# Patient Record
Sex: Female | Born: 1965 | Race: Black or African American | Hispanic: No | Marital: Married | Smoking: Never smoker
Health system: Southern US, Community
[De-identification: ages and names within clinical notes are randomized; demographics above are authoritative.]

## PROBLEM LIST (undated history)

## (undated) DIAGNOSIS — E079 Disorder of thyroid, unspecified: Secondary | ICD-10-CM

## (undated) DIAGNOSIS — I4510 Unspecified right bundle-branch block: Secondary | ICD-10-CM

## (undated) DIAGNOSIS — I10 Essential (primary) hypertension: Secondary | ICD-10-CM

## (undated) DIAGNOSIS — Z951 Presence of aortocoronary bypass graft: Secondary | ICD-10-CM

## (undated) DIAGNOSIS — R011 Cardiac murmur, unspecified: Secondary | ICD-10-CM

## (undated) DIAGNOSIS — K219 Gastro-esophageal reflux disease without esophagitis: Secondary | ICD-10-CM

## (undated) DIAGNOSIS — Z8774 Personal history of (corrected) congenital malformations of heart and circulatory system: Secondary | ICD-10-CM

## (undated) DIAGNOSIS — E039 Hypothyroidism, unspecified: Secondary | ICD-10-CM

## (undated) DIAGNOSIS — R7303 Prediabetes: Secondary | ICD-10-CM

## (undated) DIAGNOSIS — M1990 Unspecified osteoarthritis, unspecified site: Secondary | ICD-10-CM

## (undated) DIAGNOSIS — E119 Type 2 diabetes mellitus without complications: Secondary | ICD-10-CM

## (undated) HISTORY — DX: Unspecified right bundle-branch block: I45.10

## (undated) HISTORY — DX: Personal history of (corrected) congenital malformations of heart and circulatory system: Z87.74

## (undated) HISTORY — PX: CORONARY ARTERY BYPASS GRAFT: SHX141

## (undated) HISTORY — PX: COLONOSCOPY: SHX174

## (undated) HISTORY — PX: DILATION AND CURETTAGE OF UTERUS: SHX78

---
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
## 2001-02-09 ENCOUNTER — Ambulatory Visit (HOSPITAL_COMMUNITY): Admission: RE | Admit: 2001-02-09 | Discharge: 2001-02-09 | Payer: Self-pay | Admitting: Obstetrics

## 2001-02-09 ENCOUNTER — Encounter: Payer: Self-pay | Admitting: Obstetrics

## 2001-05-10 ENCOUNTER — Ambulatory Visit (HOSPITAL_COMMUNITY): Admission: RE | Admit: 2001-05-10 | Discharge: 2001-05-10 | Payer: Self-pay | Admitting: Obstetrics

## 2004-01-30 ENCOUNTER — Ambulatory Visit: Payer: Self-pay | Admitting: Family Medicine

## 2004-02-25 ENCOUNTER — Ambulatory Visit: Payer: Self-pay | Admitting: Internal Medicine

## 2004-02-26 ENCOUNTER — Ambulatory Visit: Payer: Self-pay | Admitting: *Deleted

## 2004-03-20 ENCOUNTER — Ambulatory Visit: Payer: Self-pay | Admitting: Family Medicine

## 2004-06-16 ENCOUNTER — Ambulatory Visit: Payer: Self-pay | Admitting: Internal Medicine

## 2004-06-25 ENCOUNTER — Ambulatory Visit (HOSPITAL_COMMUNITY): Admission: RE | Admit: 2004-06-25 | Discharge: 2004-06-25 | Payer: Self-pay | Admitting: Internal Medicine

## 2004-06-25 ENCOUNTER — Encounter (INDEPENDENT_AMBULATORY_CARE_PROVIDER_SITE_OTHER): Payer: Self-pay | Admitting: *Deleted

## 2004-07-17 ENCOUNTER — Ambulatory Visit: Payer: Self-pay | Admitting: Internal Medicine

## 2006-05-11 ENCOUNTER — Ambulatory Visit (HOSPITAL_COMMUNITY): Admission: RE | Admit: 2006-05-11 | Discharge: 2006-05-11 | Payer: Self-pay | Admitting: Obstetrics and Gynecology

## 2006-06-01 ENCOUNTER — Encounter: Admission: RE | Admit: 2006-06-01 | Discharge: 2006-06-01 | Payer: Self-pay | Admitting: Obstetrics and Gynecology

## 2006-11-24 ENCOUNTER — Encounter: Admission: RE | Admit: 2006-11-24 | Discharge: 2006-11-24 | Payer: Self-pay | Admitting: Obstetrics and Gynecology

## 2007-05-13 ENCOUNTER — Encounter: Admission: RE | Admit: 2007-05-13 | Discharge: 2007-05-13 | Payer: Self-pay | Admitting: Obstetrics and Gynecology

## 2008-09-28 ENCOUNTER — Ambulatory Visit (HOSPITAL_COMMUNITY): Admission: RE | Admit: 2008-09-28 | Discharge: 2008-09-28 | Payer: Self-pay | Admitting: Obstetrics and Gynecology

## 2008-09-28 ENCOUNTER — Encounter (HOSPITAL_COMMUNITY): Payer: Self-pay | Admitting: Obstetrics and Gynecology

## 2010-01-13 ENCOUNTER — Encounter: Admission: RE | Admit: 2010-01-13 | Discharge: 2010-01-13 | Payer: Self-pay | Admitting: Obstetrics and Gynecology

## 2010-05-31 LAB — COMPREHENSIVE METABOLIC PANEL
ALT: 14 U/L (ref 0–35)
ALT: 15 U/L (ref 0–35)
AST: 22 U/L (ref 0–37)
AST: 24 U/L (ref 0–37)
Albumin: 3.4 g/dL — ABNORMAL LOW (ref 3.5–5.2)
Albumin: 3.6 g/dL (ref 3.5–5.2)
Alkaline Phosphatase: 50 U/L (ref 39–117)
Alkaline Phosphatase: 50 U/L (ref 39–117)
BUN: 14 mg/dL (ref 6–23)
BUN: 9 mg/dL (ref 6–23)
CO2: 24 mEq/L (ref 19–32)
CO2: 27 mEq/L (ref 19–32)
Calcium: 8.3 mg/dL — ABNORMAL LOW (ref 8.4–10.5)
Calcium: 8.9 mg/dL (ref 8.4–10.5)
Chloride: 105 mEq/L (ref 96–112)
Chloride: 107 mEq/L (ref 96–112)
Creatinine, Ser: 0.64 mg/dL (ref 0.4–1.2)
Creatinine, Ser: 0.68 mg/dL (ref 0.4–1.2)
GFR calc Af Amer: >60 mL/min (ref >60)
GFR calc Af Amer: >60 mL/min (ref >60)
GFR calc non Af Amer: >60 mL/min (ref >60)
GFR calc non Af Amer: >60 mL/min (ref >60)
Glucose, Bld: 88 mg/dL (ref 70–99)
Glucose, Bld: 90 mg/dL (ref 70–99)
Potassium: 3.3 mEq/L — ABNORMAL LOW (ref 3.5–5.1)
Potassium: 3.6 mEq/L (ref 3.5–5.1)
Sodium: 136 mEq/L (ref 135–145)
Sodium: 136 mEq/L (ref 135–145)
Total Bilirubin: 0.6 mg/dL (ref 0.3–1.2)
Total Bilirubin: 0.8 mg/dL (ref 0.3–1.2)
Total Protein: 6.6 g/dL (ref 6.0–8.3)
Total Protein: 6.8 g/dL (ref 6.0–8.3)

## 2010-05-31 LAB — TYPE AND SCREEN
ABO/RH(D): O POS
Antibody Screen: NEGATIVE

## 2010-05-31 LAB — CBC
HCT: 35.4 % — ABNORMAL LOW (ref 36.0–46.0)
Hemoglobin: 12.1 g/dL (ref 12.0–15.0)
MCHC: 34.2 g/dL (ref 30.0–36.0)
MCV: 93.5 fL (ref 78.0–100.0)
Platelets: 245 10*3/uL (ref 150–400)
RBC: 3.78 MIL/uL — ABNORMAL LOW (ref 3.87–5.11)
RDW: 12.7 % (ref 11.5–15.5)
WBC: 7.5 10*3/uL (ref 4.0–10.5)

## 2010-05-31 LAB — ABO/RH: ABO/RH(D): O POS

## 2010-07-11 NOTE — Op Note (Signed)
[name], [name]             ACCOUNT NO.:  [id number]   MEDICAL RECORD NO.:  [id number]          PATIENT TYPE:  AMB   LOCATION:  SDC                           FACILITY:  [facility]   PHYSICIAN:  Zelphia Cairo, MD    DATE OF BIRTH:  15-Jul-1965   DATE OF PROCEDURE:  10/08/2008  DATE OF DISCHARGE:  09/28/2008                               OPERATIVE REPORT   PREOPERATIVE DIAGNOSIS:  Submucous fibroid.   PROCEDURE:  Diagnostic hysteroscopy with resection of submucous fibroid.   SURGEON:  Zelphia Cairo, MD.   ANESTHESIA:  General.   FINDINGS:  A 2-cm pedunculated submucous fibroid.   SPECIMEN:  Endometrial curettings and fibroid.   DISPOSITION:  To pathology.   ESTIMATED BLOOD LOSS:  Minimal.   COMPLICATIONS:  None.   FLUID DEFICIT:  350 mL.   CONDITION:  Stable and extubated to recovery room.   PROCEDURE:  Stephanie Garrison was taken to the operating room where general  anesthesia was found to be adequate.  She was placed in the dorsal  lithotomy position using Allen stirrups.  She was prepped and draped in  sterile fashion and an in-and-out catheter was used to drain her bladder  sterilely.  A bivalve speculum was placed in the vagina and a single-  tooth tenaculum placed on the anterior lip of the cervix.  The cervix  was serially dilated with Shawnie Pons dilators.  The diagnostic hysteroscope  was inserted into the uterus and a survey of the uterine cavity was  performed.  A large pedunculated submucous fibroid was noted within the  cavity.  We could sneak the camera behind the fibroid to see the base of  the fibroid.  The resectoscope was then inserted.  Cautery loop was used  to resect the fibroid at the base.  The loop would be placed behind the  fibroid and then shaved towards the camara.  It was difficult to monitor  our fluid deficit because of the large pooling of fluid in the drape and  on the floor.  Once more than half of the fibroid had been shaved near  the base.  The  curette was inserted into the uterine cavity and the  fibroid was gently curetted free from the uterine wall.  The  hysteroscope was reinserted.  No bleeding was noted.  No other  endometrial abnormalities were noted at this time.  The specimen was  placed on Telfa and passed off to be sent to pathology.  Hysteroscope  was then removed.  Tenaculum was removed and the cervix was found to be  hemostatic.  Speculum  was removed and the patient was taken to the recovery room.  Because of  difficulty monitoring fluid deficit, a sodium level was drawn in the  PACU.  This was normal and the patient was discharged home to follow up  in our office in 2 weeks.      Zelphia Cairo, MD  Electronically Signed     GA/MEDQ  D:  10/12/2008  T:  10/13/2008  Job:  [id number]

## 2010-07-11 NOTE — Op Note (Signed)
[facility]  Patient:    Stephanie Garrison, Stephanie Garrison Visit Number: [id number] MRN: [id number]          Service Type: DSU Location: [facility] Attending Physician:  Venita Sheffield Dictated by:   Kathreen Cosier, M.D. Proc. Date: 05/10/01 Admit Date:  05/10/2001                             Operative Report  PREOPERATIVE DIAGNOSIS:       Multiparity.  POSTOPERATIVE DIAGNOSIS:  PROCEDURE:                    Open laparoscopic tubal sterilization.  SURGEON:                      Kathreen Cosier, M.D.  ANESTHESIA:                   General anesthesia.  DESCRIPTION OF PROCEDURE:     With patient in lithotomy position, abdomen, perineum and vagina prepped and draped, bladder emptied with a straight catheter.  Weighted speculum was placed in the vagina, cervix grasped with a Hulka tenaculum.  In the umbilicus, a transverse incision was made and carried down to the fascia, fascia cleaned, grasped with two trocars, fascia and the peritoneum opened with the Mayo scissors, sleeve of the trocar inserted intraperitoneally, 3 L of CO2 infused intraperitoneally.  Uterus, tubes and ovaries were normal.  Cautery probe inserted through the sleeve of the scope and the tubes was traced to the fimbriae bilaterally.  The right tube was grasped one inch from the cornu and cauterized; the tube was cauterized in a total of five placed, moving lateral from the first site of cautery; procedure was done in a similar fashion on the other side.  Probes were removed and CO2 allowed to escape from the peritoneal cavity.  Fascia was closed with one stitch of 0 Dexon and the skin closed with subcuticular stitch of 3-0 plain. Patient tolerated the procedure well and taken to recovery room in good condition. Dictated by:   Kathreen Cosier, M.D. Attending Physician:  Venita Sheffield DD:  05/10/01 TD:  05/10/01 Job: [id number]

## 2010-12-11 ENCOUNTER — Other Ambulatory Visit: Payer: Self-pay | Admitting: Obstetrics and Gynecology

## 2010-12-11 DIAGNOSIS — Z1231 Encounter for screening mammogram for malignant neoplasm of breast: Secondary | ICD-10-CM

## 2011-01-19 ENCOUNTER — Ambulatory Visit: Payer: Self-pay

## 2011-02-18 ENCOUNTER — Ambulatory Visit: Payer: Self-pay

## 2011-02-19 ENCOUNTER — Ambulatory Visit
Admission: RE | Admit: 2011-02-19 | Discharge: 2011-02-19 | Disposition: A | Discharge status: Home / Self Care | Payer: Commercial Indemnity | Source: Ambulatory Visit | Attending: Obstetrics and Gynecology | Admitting: Obstetrics and Gynecology

## 2011-02-19 DIAGNOSIS — Z1231 Encounter for screening mammogram for malignant neoplasm of breast: Secondary | ICD-10-CM

## 2011-08-26 ENCOUNTER — Encounter (HOSPITAL_COMMUNITY): Payer: Self-pay | Admitting: *Deleted

## 2011-08-26 ENCOUNTER — Emergency Department (HOSPITAL_COMMUNITY)
Admission: EM | Admit: 2011-08-26 | Discharge: 2011-08-26 | Disposition: A | Discharge status: Home / Self Care | Payer: BC Managed Care – PPO | Attending: Emergency Medicine | Admitting: Emergency Medicine

## 2011-08-26 DIAGNOSIS — Z951 Presence of aortocoronary bypass graft: Secondary | ICD-10-CM | POA: Insufficient documentation

## 2011-08-26 DIAGNOSIS — R55 Syncope and collapse: Secondary | ICD-10-CM

## 2011-08-26 DIAGNOSIS — F410 Panic disorder [episodic paroxysmal anxiety] without agoraphobia: Secondary | ICD-10-CM | POA: Insufficient documentation

## 2011-08-26 DIAGNOSIS — Z7289 Other problems related to lifestyle: Secondary | ICD-10-CM

## 2011-08-26 DIAGNOSIS — I10 Essential (primary) hypertension: Secondary | ICD-10-CM | POA: Insufficient documentation

## 2011-08-26 HISTORY — DX: Presence of aortocoronary bypass graft: Z95.1

## 2011-08-26 HISTORY — DX: Essential (primary) hypertension: I10

## 2011-08-26 LAB — POCT I-STAT, CHEM 8
BUN: 13 mg/dL (ref 6–23)
Calcium, Ion: 1.12 mmol/L (ref 1.12–1.32)
Chloride: 106 mEq/L (ref 96–112)
Creatinine, Ser: 0.9 mg/dL (ref 0.50–1.10)
Glucose, Bld: 102 mg/dL — ABNORMAL HIGH (ref 70–99)
HCT: 37 % (ref 36.0–46.0)
Hemoglobin: 12.6 g/dL (ref 12.0–15.0)
Potassium: 3 mEq/L — ABNORMAL LOW (ref 3.5–5.1)
Sodium: 144 mEq/L (ref 135–145)
TCO2: 21 mmol/L (ref 0–100)

## 2011-08-26 MED ORDER — POTASSIUM CHLORIDE CRYS ER 20 MEQ PO TBCR
40.0000 meq | EXTENDED_RELEASE_TABLET | Freq: Once | ORAL | Status: AC
  Administered 2011-08-26: 40 meq via ORAL
  Filled 2011-08-26: qty 2

## 2011-08-26 NOTE — ED Notes (Signed)
[id number]<AV> Expected date:08/26/11<BR> Expected time:12:35 AM<BR> Means of arrival:Ambulance<BR> Comments:<BR> Syncope/ETOH

## 2011-08-26 NOTE — ED Notes (Signed)
Pt here via EMS, with c/o anxiety attack and syncopal episode. Pt admits to drinking 3 drinks tonight. Pt alert on arrival, talking, walking without assistance.

## 2011-08-26 NOTE — ED Notes (Signed)
Awaiting Md to assess. Pt stable. Family at bedside.

## 2011-08-26 NOTE — ED Provider Notes (Signed)
History     CSN: [id number]  Arrival date & time 08/26/11  0051   First MD Initiated Contact with Patient 08/26/11 0125      Chief Complaint  Patient presents with  . Panic Attack  . Loss of Consciousness    (Consider location/radiation/quality/duration/timing/severity/associated sxs/prior treatment) Patient is a 46 y.o. female presenting with syncope. The history is provided by the patient.  Loss of Consciousness   patient here with anxiety attack and near syncopal event. She has consumed 72 ounces of margarita beverage this evening. Notes similar symptoms in the past with an denies any chest pain or chest pressure. No shortness of breath or leg pain or swelling. No change in her bowel or bladder habits. No black or bloody stools. No abdominal pain. Denies illicit drug use this evening. No seizure activity noted. She feels back to her baseline Past Medical History  Diagnosis Date  . S/P CABG (coronary artery bypass graft) 1964    when she was 46 years old  . Hypertension     Past Surgical History  Procedure Date  . Cesarean section   . Dilation and curettage of uterus     Family History  Problem Relation Age of Onset  . Hyperlipidemia Mother     History  Substance Use Topics  . Smoking status: Never Smoker   . Smokeless tobacco: Not on file  . Alcohol Use: Yes     3 drinks tonight    OB History    Grav Para Term Preterm Abortions TAB SAB Ect Mult Living                  Review of Systems  Cardiovascular: Positive for syncope.  All other systems reviewed and are negative.    Allergies  Review of patient's allergies indicates no known allergies.  Home Medications  No current outpatient prescriptions on file.  BP 110/70  Pulse 78  Temp 98.5 F (36.9 C) (Oral)  Resp 18  SpO2 100%  Physical Exam  Nursing note and vitals reviewed. Constitutional: She is oriented to person, place, and time. She appears well-developed and well-nourished.  Non-toxic  appearance. No distress.  HENT:  Head: Normocephalic and atraumatic.  Eyes: Conjunctivae, EOM and lids are normal. Pupils are equal, round, and reactive to light.  Neck: Normal range of motion. Neck supple. No tracheal deviation present. No mass present.  Cardiovascular: Normal rate, regular rhythm and normal heart sounds.  Exam reveals no gallop.   No murmur heard. Pulmonary/Chest: Effort normal and breath sounds normal. No stridor. No respiratory distress. She has no decreased breath sounds. She has no wheezes. She has no rhonchi. She has no rales.  Abdominal: Soft. Normal appearance and bowel sounds are normal. She exhibits no distension. There is no tenderness. There is no rebound and no CVA tenderness.  Musculoskeletal: Normal range of motion. She exhibits no edema and no tenderness.  Neurological: She is alert and oriented to person, place, and time. She has normal strength. No cranial nerve deficit or sensory deficit. GCS eye subscore is 4. GCS verbal subscore is 5. GCS motor subscore is 6.  Skin: Skin is warm and dry. No abrasion and no rash noted.  Psychiatric: Her speech is normal and behavior is normal. Her mood appears anxious.    ED Course  Procedures (including critical care time)  Labs Reviewed - No data to display No results found.   No diagnosis found.    MDM  Patient's hypokalemia treated with oral  potassium. Patient is not orthostatic. EKG shows no signs of ACS or severe prolonged QT. Suspect that patient had her near syncopal event secondary to alcohol use. She is stable for discharge   Date: 08/26/2011  Rate: 74  Rhythm: normal sinus rhythm  QRS Axis: normal  Intervals: normal  ST/T Wave abnormalities: nonspecific ST/T changes  Conduction Disutrbances:right bundle branch block  Narrative Interpretation:   Old EKG Reviewed: none available          Toy Baker, MD 08/26/11 [phone]

## 2012-01-07 ENCOUNTER — Other Ambulatory Visit: Payer: Self-pay | Admitting: Obstetrics and Gynecology

## 2012-01-07 DIAGNOSIS — Z1231 Encounter for screening mammogram for malignant neoplasm of breast: Secondary | ICD-10-CM

## 2012-02-22 ENCOUNTER — Ambulatory Visit
Admission: RE | Admit: 2012-02-22 | Discharge: 2012-02-22 | Disposition: A | Discharge status: Home / Self Care | Payer: BC Managed Care – PPO | Source: Ambulatory Visit | Attending: Obstetrics and Gynecology | Admitting: Obstetrics and Gynecology

## 2012-02-22 DIAGNOSIS — Z1231 Encounter for screening mammogram for malignant neoplasm of breast: Secondary | ICD-10-CM

## 2012-07-08 ENCOUNTER — Other Ambulatory Visit: Payer: Self-pay | Admitting: Pharmacist Clinician (PhC)/ Clinical Pharmacy Specialist

## 2012-07-08 MED ORDER — OLMESARTAN MEDOXOMIL-HCTZ 40-25 MG PO TABS: 1.0000 | ORAL_TABLET | Freq: Every day | ORAL | Status: DC

## 2012-09-29 ENCOUNTER — Ambulatory Visit: Payer: BC Managed Care – PPO | Admitting: Cardiovascular Disease

## 2012-09-29 ENCOUNTER — Encounter: Payer: Self-pay | Admitting: Cardiovascular Disease

## 2012-09-29 ENCOUNTER — Ambulatory Visit (INDEPENDENT_AMBULATORY_CARE_PROVIDER_SITE_OTHER): Payer: BC Managed Care – PPO | Admitting: Cardiovascular Disease

## 2012-09-29 VITALS — BP 118/78 | HR 71 | Ht 62.0 in | Wt 171.6 lb

## 2012-09-29 DIAGNOSIS — I10 Essential (primary) hypertension: Secondary | ICD-10-CM | POA: Insufficient documentation

## 2012-09-29 DIAGNOSIS — I4510 Unspecified right bundle-branch block: Secondary | ICD-10-CM

## 2012-09-29 DIAGNOSIS — Q249 Congenital malformation of heart, unspecified: Secondary | ICD-10-CM | POA: Insufficient documentation

## 2012-09-29 NOTE — Patient Instructions (Addendum)
Your physician wants you to follow-up in: 1 year with Dr Berry. You will receive a reminder letter in the mail two months in advance. If you don't receive a letter, please call our office to schedule the follow-up appointment.  

## 2012-09-29 NOTE — Assessment & Plan Note (Signed)
This was done back in the 70s. She's been completely asymptomatic since. Her last 2-D echo performed 10/10/09 revealed normal LV size and function. There was no ventricular septal defect. There is no pulmonic defect as well.

## 2012-09-29 NOTE — Assessment & Plan Note (Signed)
Under adequate control on current medications 

## 2012-09-29 NOTE — Progress Notes (Signed)
   09/29/2012 Stephanie Garrison   [birth year]  [id number]  Primary Physician Gwynneth Aliment, MD Primary Cardiologist: Runell Gess MD Stephanie Garrison   HPI:  The patient is a very pleasant 47 year old mildly overweight married [place] American female, mother of 1 child, who I last saw a year ago. 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 Her other problems include hypertension, recurrent right bundle branch block. She has been asymptomatic since I saw her last except for 1 episode of what sounds like PAF as a result of excessive alcohol intake. Her last echocardiogram performed, October 10, 2009, showed normal LV size and function with normal valvular function, and borderline dilated right ventricle with normal RV systolic function. Since I saw her a year ago she  has remained entirely asymptomatic.      Current Outpatient Prescriptions  Medication Sig Dispense Refill  . amLODipine (NORVASC) 5 MG tablet Take 5 mg by mouth daily.      . cholecalciferol (VITAMIN D) 1000 UNITS tablet Take 1,000 Units by mouth daily. Take one on Tuesday and on Friday      . olmesartan-hydrochlorothiazide (BENICAR HCT) 40-25 MG per tablet Take 1 tablet by mouth daily.  30 tablet  5   No current facility-administered medications for this visit.    No Known Allergies  History   Social History  . Marital Status: Married    Spouse Name: N/A    Number of Children: N/A  . Years of Education: N/A   Occupational History  . Not on file.   Social History Main Topics  . Smoking status: Never Smoker   . Smokeless tobacco: Not on file  . Alcohol Use: Yes     Comment: 3 drinks tonight  . Drug Use: No  . Sexually Active: Yes   Other Topics Concern  . Not on file   Social History Narrative  . No narrative on file     Review of Systems: General: negative for chills, fever, night sweats or weight changes.  Cardiovascular: negative for chest pain, dyspnea on exertion,  edema, orthopnea, palpitations, paroxysmal nocturnal dyspnea or shortness of breath Dermatological: negative for rash Respiratory: negative for cough or wheezing Urologic: negative for hematuria Abdominal: negative for nausea, vomiting, diarrhea, bright red blood per rectum, melena, or hematemesis Neurologic: negative for visual changes, syncope, or dizziness All other systems reviewed and are otherwise negative except as noted above.    Blood pressure 118/78, pulse 71, height 5\' 2"  (1.575 m), weight 171 lb 9.6 oz (77.837 kg), last menstrual period 09/23/2012.  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 She's been completely asymptomatic since. Her last 2-D echo performed 10/10/09 revealed normal LV size and function. There was no ventricular septal defect. There is no pulmonic defect as well.  Essential hypertension Under adequate control on current medications      Runell Gess MD [facility], [facility] 09/29/2012 4:00 PM

## 2012-10-17 ENCOUNTER — Other Ambulatory Visit: Payer: Self-pay | Admitting: Cardiovascular Disease

## 2013-02-21 ENCOUNTER — Other Ambulatory Visit: Payer: Self-pay

## 2013-02-21 DIAGNOSIS — Z1231 Encounter for screening mammogram for malignant neoplasm of breast: Secondary | ICD-10-CM

## 2013-03-02 ENCOUNTER — Ambulatory Visit
Admission: RE | Admit: 2013-03-02 | Discharge: 2013-03-02 | Disposition: A | Discharge status: Home / Self Care | Payer: 59 | Source: Ambulatory Visit

## 2013-03-02 DIAGNOSIS — Z1231 Encounter for screening mammogram for malignant neoplasm of breast: Secondary | ICD-10-CM

## 2013-05-01 ENCOUNTER — Other Ambulatory Visit: Payer: Self-pay | Admitting: Pharmacist Clinician (PhC)/ Clinical Pharmacy Specialist

## 2013-05-01 MED ORDER — OLMESARTAN MEDOXOMIL-HCTZ 40-25 MG PO TABS: 1.0000 | ORAL_TABLET | Freq: Every day | ORAL | Status: DC

## 2013-05-03 ENCOUNTER — Other Ambulatory Visit: Payer: Self-pay | Admitting: Pharmacist Clinician (PhC)/ Clinical Pharmacy Specialist

## 2013-08-16 ENCOUNTER — Encounter (HOSPITAL_COMMUNITY): Payer: Self-pay | Admitting: Pharmacist

## 2013-10-02 ENCOUNTER — Ambulatory Visit (INDEPENDENT_AMBULATORY_CARE_PROVIDER_SITE_OTHER): Payer: 59 | Admitting: Cardiovascular Disease

## 2013-10-02 ENCOUNTER — Encounter: Payer: Self-pay | Admitting: Cardiovascular Disease

## 2013-10-02 VITALS — BP 135/94 | HR 71 | Ht 62.0 in | Wt 176.0 lb

## 2013-10-02 DIAGNOSIS — I4510 Unspecified right bundle-branch block: Secondary | ICD-10-CM

## 2013-10-02 DIAGNOSIS — I10 Essential (primary) hypertension: Secondary | ICD-10-CM

## 2013-10-02 NOTE — Progress Notes (Signed)
     10/02/2013 Stephanie Garrison   [birth year]  [id number]  Primary Physician Maximino Greenland, MD Primary Cardiologist:  Lorretta Harp MD [name]   HPI:  The patient is a very pleasant 48 year old mildly overweight married [place] American female, mother of 1 child, who I last saw a year ago. She has a history of repaired tetralogy of Fallot back in the 7s. Her other problems include hypertension, recurrent right bundle branch block. She has been asymptomatic since I saw her last except for 1 episode of what sounds like PAF as a result of excessive alcohol intake. Her last echocardiogram performed, October 10, 2009, showed normal LV size and function with normal valvular function, and borderline dilated right ventricle with normal RV systolic function. Since I saw her a year ago she has remained entirely asymptomatic.     Current Outpatient Prescriptions  Medication Sig Dispense Refill  . amLODipine (NORVASC) 5 MG tablet TAKE 1 TABLET BY MOUTH DAILY  30 tablet  11  . cholecalciferol (VITAMIN D) 1000 UNITS tablet Take 1,000 Units by mouth daily. Take one on Tuesday and on Friday      . levothyroxine (SYNTHROID, LEVOTHROID) 25 MCG tablet Take 25 mcg by mouth daily before breakfast.      . olmesartan-hydrochlorothiazide (BENICAR HCT) 40-25 MG per tablet Take 1 tablet by mouth daily.  30 tablet  5  . Vitamin D, Ergocalciferol, (DRISDOL) 50000 UNITS CAPS capsule Take 50,000 Units by mouth. Twice a week       No current facility-administered medications for this visit.    No Known Allergies  History   Social History  . Marital Status: Married    Spouse Name: N/A    Number of Children: N/A  . Years of Education: N/A   Occupational History  . Not on file.   Social History Main Topics  . Smoking status: Never Smoker   . Smokeless tobacco: Not on file  . Alcohol Use: Yes     Comment: 3 drinks tonight  . Drug Use: No  . Sexual Activity: Yes   Other Topics  Concern  . Not on file   Social History Narrative  . No narrative on file     Review of Systems: General: negative for chills, fever, night sweats or weight changes.  Cardiovascular: negative for chest pain, dyspnea on exertion, edema, orthopnea, palpitations, paroxysmal nocturnal dyspnea or shortness of breath Dermatological: negative for rash Respiratory: negative for cough or wheezing Urologic: negative for hematuria Abdominal: negative for nausea, vomiting, diarrhea, bright red blood per rectum, melena, or hematemesis Neurologic: negative for visual changes, syncope, or dizziness All other systems reviewed and are otherwise negative except as noted above.    Blood pressure 135/94, pulse 71, height 5\' 2"  (1.575 m), weight 176 lb (79.833 kg).  General appearance: alert and no distress Neck: no adenopathy, no carotid bruit, no JVD, supple, symmetrical, trachea midline and thyroid not enlarged, symmetric, no tenderness/mass/nodules Lungs: clear to auscultation bilaterally Heart: 2/6 outflow tract murmur with a soft gallop Extremities: extremities normal, atraumatic, no cyanosis or edema  EKG normal sinus rhythm at 71 with her branch block  ASSESSMENT AND PLAN:   Right bundle branch block chronic  Essential hypertension Controlled on current medications      Lorretta Harp MD [facility], [facility] 10/02/2013 8:18 AM

## 2013-10-02 NOTE — Assessment & Plan Note (Signed)
chronic

## 2013-10-02 NOTE — Assessment & Plan Note (Signed)
Controlled on current medications 

## 2013-10-02 NOTE — Patient Instructions (Signed)
Your physician wants you to follow-up in: 1 year with Dr Berry. You will receive a reminder letter in the mail two months in advance. If you don't receive a letter, please call our office to schedule the follow-up appointment.  

## 2013-10-12 ENCOUNTER — Encounter (HOSPITAL_COMMUNITY): Admission: RE | Payer: Self-pay | Source: Ambulatory Visit

## 2013-10-12 ENCOUNTER — Ambulatory Visit (HOSPITAL_COMMUNITY): Admission: RE | Admit: 2013-10-12 | Payer: 59 | Source: Ambulatory Visit | Admitting: Obstetrics and Gynecology

## 2013-10-12 SURGERY — DILATATION & CURETTAGE/HYSTEROSCOPY WITH TRUCLEAR
Anesthesia: Choice

## 2013-10-22 ENCOUNTER — Other Ambulatory Visit: Payer: Self-pay | Admitting: Cardiovascular Disease

## 2013-10-23 ENCOUNTER — Other Ambulatory Visit: Payer: Self-pay | Admitting: Pharmacist Clinician (PhC)/ Clinical Pharmacy Specialist

## 2013-10-23 MED ORDER — AMLODIPINE BESYLATE 5 MG PO TABS: 5.0000 mg | ORAL_TABLET | Freq: Every day | ORAL | Status: DC

## 2013-10-23 MED ORDER — OLMESARTAN MEDOXOMIL-HCTZ 40-25 MG PO TABS: 1.0000 | ORAL_TABLET | Freq: Every day | ORAL | Status: DC

## 2013-12-19 ENCOUNTER — Other Ambulatory Visit: Payer: Self-pay | Admitting: Obstetrics and Gynecology

## 2014-04-04 ENCOUNTER — Other Ambulatory Visit: Payer: Self-pay | Admitting: Obstetrics and Gynecology

## 2014-04-06 LAB — CYTOLOGY - PAP

## 2014-05-22 ENCOUNTER — Telehealth: Payer: Self-pay | Admitting: Cardiovascular Disease

## 2014-05-22 MED ORDER — AMLODIPINE BESYLATE 5 MG PO TABS: 5.0000 mg | ORAL_TABLET | Freq: Every day | ORAL | Status: DC

## 2014-05-22 NOTE — Telephone Encounter (Signed)
Pt need prior authorization for her Amlodipone. [facility] call to [phone].

## 2014-05-22 NOTE — Telephone Encounter (Signed)
PA shouldn't be req for Amlodipine - spoke w/ patient, she identified need for refill. Submitted for 30 day supply & refills to preferred pharmacy.

## 2014-05-26 ENCOUNTER — Other Ambulatory Visit: Payer: Self-pay | Admitting: Cardiovascular Disease

## 2014-05-28 NOTE — Telephone Encounter (Signed)
Rx(s) sent to pharmacy electronically.  

## 2014-10-09 ENCOUNTER — Ambulatory Visit: Payer: Self-pay | Admitting: Cardiovascular Disease

## 2014-10-24 ENCOUNTER — Ambulatory Visit (INDEPENDENT_AMBULATORY_CARE_PROVIDER_SITE_OTHER): Payer: 59 | Admitting: Cardiovascular Disease

## 2014-10-24 ENCOUNTER — Encounter: Payer: Self-pay | Admitting: Cardiovascular Disease

## 2014-10-24 VITALS — BP 122/86 | HR 74 | Ht 62.0 in | Wt 169.0 lb

## 2014-10-24 DIAGNOSIS — I10 Essential (primary) hypertension: Secondary | ICD-10-CM | POA: Diagnosis not present

## 2014-10-24 DIAGNOSIS — Q249 Congenital malformation of heart, unspecified: Secondary | ICD-10-CM

## 2014-10-24 DIAGNOSIS — I4510 Unspecified right bundle-branch block: Secondary | ICD-10-CM

## 2014-10-24 NOTE — Assessment & Plan Note (Addendum)
History of corrected tetralogy of fallot at age 49 with no sequela.

## 2014-10-24 NOTE — Progress Notes (Signed)
     10/24/2014 Stephanie Garrison   18-May-1965  [id number]  Primary Physician Maximino Greenland, MD Primary Cardiologist: Lorretta Harp MD Stephanie Garrison   HPI:  The patient is a very pleasant 49 year old mildly overweight married [place] American female, mother of 1 child, who I last saw a year ago. She has a history of repaired tetralogy of Fallot back in the 54s. Her other problems include hypertension, chronic right bundle branch block. She has been asymptomatic since I saw her last except for 1 episode of what sounds like PAF as a result of excessive alcohol intake. Her last echocardiogram performed, October 10, 2009, showed normal LV size and function with normal valvular function, and borderline dilated right ventricle with normal RV systolic function. Since I saw her a year ago she has remained entirely asymptomatic.   Current Outpatient Prescriptions  Medication Sig Dispense Refill  . amLODipine (NORVASC) 5 MG tablet Take 1 tablet (5 mg total) by mouth daily. 30 tablet 6  . levothyroxine (SYNTHROID, LEVOTHROID) 25 MCG tablet Take 25 mcg by mouth daily before breakfast.    . Vitamin D, Ergocalciferol, (DRISDOL) 50000 UNITS CAPS capsule Take 50,000 Units by mouth. Twice a week     No current facility-administered medications for this visit.    No Known Allergies  Social History   Social History  . Marital Status: Married    Spouse Name: N/A  . Number of Children: N/A  . Years of Education: N/A   Occupational History  . Not on file.   Social History Main Topics  . Smoking status: Never Smoker   . Smokeless tobacco: Not on file  . Alcohol Use: Yes     Comment: 3 drinks tonight  . Drug Use: No  . Sexual Activity: Yes   Other Topics Concern  . Not on file   Social History Narrative     Review of Systems: General: negative for chills, fever, night sweats or weight changes.  Cardiovascular: negative for chest pain, dyspnea on exertion, edema, orthopnea,  palpitations, paroxysmal nocturnal dyspnea or shortness of breath Dermatological: negative for rash Respiratory: negative for cough or wheezing Urologic: negative for hematuria Abdominal: negative for nausea, vomiting, diarrhea, bright red blood per rectum, melena, or hematemesis Neurologic: negative for visual changes, syncope, or dizziness All other systems reviewed and are otherwise negative except as noted above.    Blood pressure 122/86, pulse 74, height 5\' 2"  (1.575 m), weight 169 lb (76.658 kg).  General appearance: alert and no distress Neck: no adenopathy, no carotid bruit, no JVD, supple, symmetrical, trachea midline and thyroid not enlarged, symmetric, no tenderness/mass/nodules Lungs: clear to auscultation bilaterally Heart: ssoft outflow tract murmur Extremities: extremities normal, atraumatic, no cyanosis or edema  EKG normal sinus rhythm at 74 for pulmonary branch block unchanged from prior EKGs. I personally reviewed this EKG  ASSESSMENT AND PLAN:   Right bundle branch block chronic  Essential hypertension History of hypertension blood pressure measured at 122/86. She is on amlodipine. 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      Lorretta Harp MD FACP,FACC,FAHA, [facility] 10/24/2014 10:06 AM

## 2014-10-24 NOTE — Patient Instructions (Signed)
Your physician wants you to follow-up in: 1 year with Dr Berry. You will receive a reminder letter in the mail two months in advance. If you don't receive a letter, please call our office to schedule the follow-up appointment.  

## 2014-10-24 NOTE — Assessment & Plan Note (Signed)
chronic

## 2014-10-24 NOTE — Assessment & Plan Note (Signed)
History of hypertension blood pressure measured at 122/86. She is on amlodipine. Continue current meds at current dosing

## 2014-10-27 ENCOUNTER — Other Ambulatory Visit: Payer: Self-pay | Admitting: Cardiovascular Disease

## 2014-12-12 ENCOUNTER — Other Ambulatory Visit: Payer: Self-pay | Admitting: Cardiovascular Disease

## 2014-12-12 MED ORDER — AMLODIPINE BESYLATE 5 MG PO TABS: 5.0000 mg | ORAL_TABLET | Freq: Every day | ORAL | Status: DC

## 2014-12-12 NOTE — Telephone Encounter (Signed)
Rx(s) sent to pharmacy electronically.  

## 2014-12-12 NOTE — Telephone Encounter (Signed)
°  STAT if patient is at the pharmacy , call can be transferred to refill team.   1. Which medications need to be refilled?Amlodipine  2. Which pharmacy[phone] 3. Do they need a 30 day or 90 day supply? 90 and refil

## 2015-10-27 ENCOUNTER — Other Ambulatory Visit: Payer: Self-pay | Admitting: Cardiovascular Disease

## 2015-10-29 NOTE — Telephone Encounter (Signed)
Rx(s) sent to pharmacy electronically.  

## 2015-10-30 ENCOUNTER — Telehealth: Payer: Self-pay | Admitting: Cardiovascular Disease

## 2015-10-30 ENCOUNTER — Other Ambulatory Visit: Payer: Self-pay | Admitting: Cardiovascular Disease

## 2015-10-30 NOTE — Telephone Encounter (Signed)
Error

## 2015-11-03 ENCOUNTER — Other Ambulatory Visit: Payer: Self-pay | Admitting: Cardiovascular Disease

## 2015-11-04 NOTE — Telephone Encounter (Signed)
REFILL 

## 2015-11-05 ENCOUNTER — Encounter: Payer: Self-pay | Admitting: Cardiovascular Disease

## 2015-11-05 ENCOUNTER — Ambulatory Visit (INDEPENDENT_AMBULATORY_CARE_PROVIDER_SITE_OTHER): Payer: 59 | Admitting: Cardiovascular Disease

## 2015-11-05 VITALS — BP 118/84 | HR 72 | Ht 67.0 in | Wt 173.4 lb

## 2015-11-05 DIAGNOSIS — I10 Essential (primary) hypertension: Secondary | ICD-10-CM | POA: Diagnosis not present

## 2015-11-05 DIAGNOSIS — Z9889 Other specified postprocedural states: Secondary | ICD-10-CM

## 2015-11-05 DIAGNOSIS — Z8774 Personal history of (corrected) congenital malformations of heart and circulatory system: Secondary | ICD-10-CM | POA: Diagnosis not present

## 2015-11-05 DIAGNOSIS — Q249 Congenital malformation of heart, unspecified: Secondary | ICD-10-CM

## 2015-11-05 DIAGNOSIS — I4510 Unspecified right bundle-branch block: Secondary | ICD-10-CM

## 2015-11-05 NOTE — Assessment & Plan Note (Signed)
History of hypertension with blood pressure measured at 118/84. She is on Benicar, or thiazide and amlodipine. Continue current meds at current dosing.

## 2015-11-05 NOTE — Progress Notes (Signed)
     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 Her other problems include hypertension, chronic right bundle branch block. She has been asymptomatic since I saw her last except for 1 episode of what sounds like PAF as a result of excessive alcohol intake. Her last echocardiogram performed, October 10, 2009, showed normal LV size and function with normal valvular function, and borderline dilated right ventricle with normal RV systolic function. Since I saw her a year ago she has remained entirely asymptomatic.   Current Outpatient Prescriptions  Medication Sig Dispense Refill  . amLODipine (NORVASC) 5 MG tablet Take 1 tablet (5 mg total) by mouth daily. 30 tablet 10  . BENICAR HCT 40-25 MG tablet TAKE 1 TABLET BY MOUTH DAILY. 30 tablet 0  . levothyroxine (SYNTHROID, LEVOTHROID) 50 MCG tablet Take 1 tablet by mouth daily.     No current facility-administered medications for this visit.     No Known Allergies  Social History   Social History  . Marital status: Married    Spouse name: N/A  . Number of children: N/A  . Years of education: N/A   Occupational History  . Not on file.   Social History Main Topics  . Smoking status: Never Smoker  . Smokeless tobacco: Not on file  . Alcohol use Yes     Comment: 3 drinks tonight  . Drug use: No  . Sexual activity: Yes   Other Topics Concern  . Not on file   Social History Narrative  . No narrative on file     Review of Systems: General: negative for chills, fever, night sweats or weight changes.  Cardiovascular: negative for chest pain, dyspnea on exertion, edema, orthopnea, palpitations, paroxysmal  nocturnal dyspnea or shortness of breath Dermatological: negative for rash Respiratory: negative for cough or wheezing Urologic: negative for hematuria Abdominal: negative for nausea, vomiting, diarrhea, bright red blood per rectum, melena, or hematemesis Neurologic: negative for visual changes, syncope, or dizziness All other systems reviewed and are otherwise negative except as noted above.    Blood pressure 118/84, pulse 72, height 5\' 7"  (1.702 m), weight 173 lb 6 oz (78.6 kg).  General appearance: alert and no distress Neck: no adenopathy, no carotid bruit, no JVD, supple, symmetrical, trachea midline and thyroid not enlarged, symmetric, no tenderness/mass/nodules Lungs: clear to auscultation bilaterally Heart: 2/6 outflow tract murmur Extremities: extremities normal, atraumatic, no cyanosis or edema  EKG normal sinus rhythm at 73 with right bundle branch block. I presume reviewed this EKG  ASSESSMENT AND PLAN:   Right bundle branch block Chronic  Essential hypertension History of hypertension with blood pressure measured at 118/84. She is on Benicar, or thiazide and amlodipine. Continue current meds at current dosing.  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 Her last 2-D echo was in 2011. She has multiple hospital check murmurs but is asymptomatic. I will repeat a 2-D echocardiogram.      Lorretta Harp MD [facility], [facility] 11/05/2015 8:48 AM

## 2015-11-05 NOTE — Patient Instructions (Signed)

## 2015-11-05 NOTE — Assessment & Plan Note (Signed)
History of congenital heart disease/repaired tetralogy of Fallot in the 68s. Her last 2-D echo was in 2011. She has multiple hospital check murmurs but is asymptomatic. I will repeat a 2-D echocardiogram.

## 2015-11-05 NOTE — Assessment & Plan Note (Signed)
Chronic. 

## 2015-11-18 ENCOUNTER — Other Ambulatory Visit (HOSPITAL_COMMUNITY): Payer: 59

## 2015-11-20 ENCOUNTER — Ambulatory Visit (HOSPITAL_COMMUNITY): Payer: 59 | Attending: Cardiovascular Disease

## 2015-11-20 ENCOUNTER — Other Ambulatory Visit: Payer: Self-pay

## 2015-11-20 DIAGNOSIS — Z8774 Personal history of (corrected) congenital malformations of heart and circulatory system: Secondary | ICD-10-CM | POA: Insufficient documentation

## 2015-11-20 DIAGNOSIS — I351 Nonrheumatic aortic (valve) insufficiency: Secondary | ICD-10-CM | POA: Diagnosis not present

## 2015-11-20 DIAGNOSIS — Z9889 Other specified postprocedural states: Secondary | ICD-10-CM | POA: Insufficient documentation

## 2015-11-20 DIAGNOSIS — I517 Cardiomegaly: Secondary | ICD-10-CM | POA: Insufficient documentation

## 2015-11-28 ENCOUNTER — Other Ambulatory Visit: Payer: Self-pay | Admitting: Cardiovascular Disease

## 2015-12-08 ENCOUNTER — Other Ambulatory Visit: Payer: Self-pay | Admitting: Cardiovascular Disease

## 2016-01-14 ENCOUNTER — Emergency Department (HOSPITAL_COMMUNITY)
Admission: EM | Admit: 2016-01-14 | Discharge: 2016-01-14 | Disposition: A | Discharge status: Home / Self Care | Payer: 59 | Attending: Emergency Medicine | Admitting: Emergency Medicine

## 2016-01-14 ENCOUNTER — Encounter (HOSPITAL_COMMUNITY): Payer: Self-pay

## 2016-01-14 DIAGNOSIS — M545 Low back pain, unspecified: Secondary | ICD-10-CM

## 2016-01-14 DIAGNOSIS — Z951 Presence of aortocoronary bypass graft: Secondary | ICD-10-CM | POA: Diagnosis not present

## 2016-01-14 DIAGNOSIS — Z79899 Other long term (current) drug therapy: Secondary | ICD-10-CM | POA: Diagnosis not present

## 2016-01-14 DIAGNOSIS — E039 Hypothyroidism, unspecified: Secondary | ICD-10-CM | POA: Diagnosis not present

## 2016-01-14 DIAGNOSIS — I10 Essential (primary) hypertension: Secondary | ICD-10-CM | POA: Diagnosis not present

## 2016-01-14 HISTORY — DX: Disorder of thyroid, unspecified: E07.9

## 2016-01-14 MED ORDER — OXYCODONE-ACETAMINOPHEN 5-325 MG PO TABS: 1.0000 | ORAL_TABLET | ORAL | 0 refills | Status: DC | PRN

## 2016-01-14 MED ORDER — IBUPROFEN 600 MG PO TABS: 600.0000 mg | ORAL_TABLET | Freq: Four times a day (QID) | ORAL | 0 refills | Status: DC | PRN

## 2016-01-14 MED ORDER — OXYCODONE-ACETAMINOPHEN 5-325 MG PO TABS
1.0000 | ORAL_TABLET | Freq: Once | ORAL | Status: AC
  Administered 2016-01-14: 1 via ORAL
  Filled 2016-01-14: qty 1

## 2016-01-14 MED ORDER — DIAZEPAM 5 MG PO TABS: 5.0000 mg | ORAL_TABLET | Freq: Two times a day (BID) | ORAL | 0 refills | Status: DC

## 2016-01-14 MED ORDER — DIAZEPAM 5 MG PO TABS
5.0000 mg | ORAL_TABLET | Freq: Once | ORAL | Status: AC
  Administered 2016-01-14: 5 mg via ORAL
  Filled 2016-01-14: qty 1

## 2016-01-14 NOTE — ED Triage Notes (Signed)
Per Pt, Pt is coming from home with complaints of bilatera back pain that started yesterday. Denies injury to the back at any point. Denies any urinary symptoms or tingling to the legs. Pt reports worsened with lying and sitting.

## 2016-01-14 NOTE — ED Provider Notes (Signed)
[facility] DEPT Provider Note   CSN: [id number] Arrival date & time: 01/14/16  0849     History   Chief Complaint Chief Complaint  Patient presents with  . Back Pain    HPI Stephanie Garrison is a 50 y.o. female.  HPI   Patient is a 50 year old female with history of CABG, hypertension, hypothyroidism who presents to the ED with complaint of bilateral lower back pain, onset yesterday afternoon. Patient reports after sitting in a chair watching TV she began to notice constant aching pain to her bilateral lower back. She reports her pain is aggravated with laying down, trying to sit up or bending. Denies any alleviating factors. Denies radiation. Pt denies fever, numbness, tingling, saddle anesthesia, loss of bowel or bladder, chest pain, SOB, abdominal pain, urinary sxs, vaginal bleeding, vaginal d/c, blood in urine or stool, weakness, IVDU, cancer or recent spinal manipulation. Patient denies history of back pain. She reports taking ibuprofen at home without relief. Denies any recent fall, injury, trauma, heavy lifting, new exercises or new activity.  Past Medical History:  Diagnosis Date  . Hypertension   . RBBB (right bundle branch block)    recurrent, asymptomatic  . S/P CABG (coronary artery bypass graft) 1964   when she was 50 years old  . S/P repair of tetralogy of Fallot    2D ECHO, 10/10/2009 - EF >55%, normal to mild  . Thyroid disease    hypothyroidism    Patient Active Problem List   Diagnosis Date Noted  . Right bundle branch block 09/29/2012  . Essential hypertension 09/29/2012  . Congenital heart disease 09/29/2012    Past Surgical History:  Procedure Laterality Date  . CESAREAN SECTION    . DILATION AND CURETTAGE OF UTERUS      OB History    No data available       Home Medications    Prior to Admission medications   Medication Sig Start Date End Date Taking? Authorizing Provider  amLODipine (NORVASC) 5 MG tablet TAKE 1 TABLET (5 MG TOTAL) BY  MOUTH DAILY. 12/09/15  Yes Lorretta Harp, MD  levothyroxine (SYNTHROID, LEVOTHROID) 50 MCG tablet Take 1 tablet by mouth daily. 10/27/15  Yes Historical Provider, MD  olmesartan-hydrochlorothiazide (BENICAR HCT) 40-25 MG tablet Take 1 tablet by mouth daily. 11/28/15  Yes Lorretta Harp, MD  diazepam (VALIUM) 5 MG tablet Take 1 tablet (5 mg total) by mouth 2 (two) times daily. 01/14/16   Nona Dell, PA-C  ibuprofen (ADVIL,MOTRIN) 600 MG tablet Take 1 tablet (600 mg total) by mouth every 6 (six) hours as needed. 01/14/16   Nona Dell, PA-C  oxyCODONE-acetaminophen (PERCOCET/ROXICET) 5-325 MG tablet Take 1 tablet by mouth every 4 (four) hours as needed for severe pain. 01/14/16   Nona Dell, PA-C    Family History Family History  Problem Relation Age of Onset  . Hyperlipidemia Mother     Social History Social History  Substance Use Topics  . Smoking status: Never Smoker  . Smokeless tobacco: Never Used  . Alcohol use Yes     Comment: 3 drinks tonight or more      Allergies   Patient has no known allergies.   Review of Systems Review of Systems  Musculoskeletal: Positive for back pain.  All other systems reviewed and are negative.    Physical Exam Updated Vital Signs BP 127/88 (BP Location: Right Arm)   Pulse 76   Temp 98 F (36.7 C) (Oral)  Resp 20   Ht 5\' 2"  (1.575 m)   Wt 77.1 kg   SpO2 99%   BMI 31.09 kg/m   Physical Exam  Constitutional: She is oriented to person, place, and time. She appears well-developed and well-nourished. No distress.  HENT:  Head: Normocephalic and atraumatic.  Eyes: Conjunctivae and EOM are normal. Right eye exhibits no discharge. Left eye exhibits no discharge. No scleral icterus.  Neck: Normal range of motion. Neck supple.  Cardiovascular: Normal rate, regular rhythm, normal heart sounds and intact distal pulses.   Pulmonary/Chest: Effort normal and breath sounds normal. No respiratory  distress. She has no wheezes. She has no rales. She exhibits no tenderness.  Abdominal: Soft. Bowel sounds are normal. She exhibits no distension and no mass. There is no tenderness. There is no rebound and no guarding. No hernia.  No CVA tenderness  Musculoskeletal: She exhibits no edema.  No midline C, T, or L tenderness. TTP over bilateral lower thoracic paraspinal muscles. Full range of motion of neck and dec ROM of back due to reported pain. Full range of motion of bilateral upper and lower extremities, with 5/5 strength. Sensation intact. 2+ radial and PT pulses. Cap refill <2 seconds. Patient able to stand and ambulate without assistance.    Neurological: She is alert and oriented to person, place, and time. She has normal strength. She displays normal reflexes. No sensory deficit. Coordination and gait normal.  Skin: Skin is warm and dry. Capillary refill takes less than 2 seconds. She is not diaphoretic.  Nursing note and vitals reviewed.    ED Treatments / Results  Labs (all labs ordered are listed, but only abnormal results are displayed) Labs Reviewed - No data to display  EKG  EKG Interpretation None       Radiology No results found.  Procedures Procedures (including critical care time)  Medications Ordered in ED Medications  oxyCODONE-acetaminophen (PERCOCET/ROXICET) 5-325 MG per tablet 1 tablet (1 tablet Oral Given 01/14/16 1007)  diazepam (VALIUM) tablet 5 mg (5 mg Oral Given 01/14/16 1007)     Initial Impression / Assessment and Plan / ED Course  I have reviewed the triage vital signs and the nursing notes.  Pertinent labs & imaging results that were available during my care of the patient were reviewed by me and considered in my medical decision making (see chart for details).  Clinical Course     Patient with back pain.  No neurological deficits and normal neuro exam. VSS.  Patient can walk but states is painful.  No loss of bowel or bladder control.  No  concern for cauda equina.  No fever, night sweats, weight loss, h/o cancer, IVDU. Patient given pain meds and muscle relaxant in the ED. On reevaluation patient reports significant improvement of her pain. Patient able to stand and ambulate. Suspect patient's symptoms are likely due to muscle strain. Plan to discharge patient home with muscle relaxant, RICE protocol and pain medicine. Advised patient to follow up with PCP as needed. Discussed strict return precautions.  Final Clinical Impressions(s) / ED Diagnoses   Final diagnoses:  Acute bilateral low back pain without sciatica    New Prescriptions New Prescriptions   DIAZEPAM (VALIUM) 5 MG TABLET    Take 1 tablet (5 mg total) by mouth 2 (two) times daily.   IBUPROFEN (ADVIL,MOTRIN) 600 MG TABLET    Take 1 tablet (600 mg total) by mouth every 6 (six) hours as needed.   OXYCODONE-ACETAMINOPHEN (PERCOCET/ROXICET) 5-325 MG TABLET  Take 1 tablet by mouth every 4 (four) hours as needed for severe pain.     Chesley Noon [place], [place] 01/14/16 1052    Milton Ferguson, MD 01/14/16 [id number]

## 2016-01-14 NOTE — Discharge Instructions (Signed)
Take your medication as prescribed as had for pain relief. I also recommend applying ice and/or heat to affected area for 15-20 minutes 3-4 times daily for additional pain relief. Refrain from doing any heavy lifting, bending or movement that exacerbates her pain for the next few days. Follow-up with your primary care provider within the next week if your symptoms have not improved. Please return to the Emergency Department if symptoms worsen or new onset of fever, numbness, tingling, groin anesthesia, loss of bowel or bladder, weakness, abdominal pain, urinary retention.

## 2016-11-07 ENCOUNTER — Other Ambulatory Visit: Payer: Self-pay | Admitting: Cardiovascular Disease

## 2016-11-09 NOTE — Telephone Encounter (Signed)
Rx(s) sent to pharmacy electronically.  

## 2016-12-05 ENCOUNTER — Other Ambulatory Visit: Payer: Self-pay | Admitting: Cardiovascular Disease

## 2016-12-06 ENCOUNTER — Other Ambulatory Visit: Payer: Self-pay | Admitting: Cardiovascular Disease

## 2016-12-07 ENCOUNTER — Telehealth: Payer: Self-pay | Admitting: Cardiovascular Disease

## 2016-12-07 NOTE — Telephone Encounter (Signed)
Rx(s) sent to pharmacy electronically.  

## 2016-12-07 NOTE — Telephone Encounter (Signed)
Called patient and LVM to call me back to schedule 1 year followup.

## 2016-12-08 ENCOUNTER — Telehealth: Payer: Self-pay | Admitting: Cardiovascular Disease

## 2016-12-08 NOTE — Telephone Encounter (Signed)
Called patient and LVM to call back to schedule yearly followup with Dr. Gwenlyn Found.

## 2016-12-09 ENCOUNTER — Telehealth: Payer: Self-pay | Admitting: Cardiovascular Disease

## 2016-12-09 NOTE — Telephone Encounter (Signed)
Called the patient and LVM for her to call the office to schedule her yearly followup with Dr. Gwenlyn Found.

## 2017-01-01 ENCOUNTER — Ambulatory Visit: Payer: 59 | Admitting: Cardiovascular Disease

## 2017-01-04 ENCOUNTER — Other Ambulatory Visit: Payer: Self-pay | Admitting: Cardiovascular Disease

## 2017-01-13 ENCOUNTER — Encounter: Payer: Self-pay | Admitting: Cardiovascular Disease

## 2017-01-13 ENCOUNTER — Ambulatory Visit: Payer: 59 | Admitting: Cardiovascular Disease

## 2017-01-13 VITALS — BP 130/84 | HR 62 | Ht 62.0 in | Wt 160.0 lb

## 2017-01-13 DIAGNOSIS — I4510 Unspecified right bundle-branch block: Secondary | ICD-10-CM

## 2017-01-13 DIAGNOSIS — Q249 Congenital malformation of heart, unspecified: Secondary | ICD-10-CM | POA: Diagnosis not present

## 2017-01-13 DIAGNOSIS — I10 Essential (primary) hypertension: Secondary | ICD-10-CM | POA: Diagnosis not present

## 2017-01-13 NOTE — Patient Instructions (Signed)
Your physician wants you to follow-up in: ONE YEAR WITH DR BERRY You will receive a reminder letter in the mail two months in advance. If you don't receive a letter, please call our office to schedule the follow-up appointment.   If you need a refill on your cardiac medications before your next appointment, please call your pharmacy.  

## 2017-01-13 NOTE — Assessment & Plan Note (Signed)
History of repaired tetralogy of the low back in the 1970s. She had a 2-D echo performed 11/20/15 revealing normal LV systolic function, mild pulmonary hypertension with no evidence of a VSD. She is completely asymptomatic.

## 2017-01-13 NOTE — Assessment & Plan Note (Signed)
History of essential hypertension blood pressure measured 130/84. She is on olmesartan, amlodipine and hydrochlorothiazide. Continue current meds at current dosing.

## 2017-01-13 NOTE — Assessment & Plan Note (Signed)
Chronic. 

## 2017-01-13 NOTE — Progress Notes (Signed)
01/13/2017 [name]   12-Jun-1965  [id number]  Primary Physician Glendale Chard, MD Primary Cardiologist: Lorretta Harp MD [name], [place]  HPI:  Stephanie Garrison is a 51 y.o. female mildly overweight married [place] American female, mother of 1 child, who I last saw  11/05/15  She has a history of repaired tetralogy of Fallot back in the 35s. Her other problems include hypertension, chronic right bundle branch block. She has been asymptomatic since I saw her last except for 1 episode of what sounds like PAF as a result of excessive alcohol intake. Her last echocardiogram performed, October 10, 2009, showed normal LV size and function with normal valvular function, and borderline dilated right ventricle with normal RV systolic function. Since I saw her a year ago she has remained entirely asymptomatic. A 2-D echo performed 11/20/15 revealed normal LV size and function, mild pulmonary hypertension with no evidence of VSD.     Current Meds  Medication Sig  . amLODipine (NORVASC) 5 MG tablet TAKE 1 TABLET (5 MG TOTAL) BY MOUTH DAILY. <PLEASE MAKE APPOINTMENT FOR REFILLS>  . levothyroxine (SYNTHROID, LEVOTHROID) 50 MCG tablet Take 1 tablet by mouth daily.  Marland Kitchen olmesartan-hydrochlorothiazide (BENICAR HCT) 40-25 MG tablet TAKE 1 TABLET BY MOUTH DAILY. PLEASE CONTACT OFFICE FOR ADDITIONAL REFILLS     No Known Allergies  Social History   Socioeconomic History  . Marital status: Married    Spouse name: Not on file  . Number of children: Not on file  . Years of education: Not on file  . Highest education level: Not on file  Social Needs  . Financial resource strain: Not on file  . Food insecurity - worry: Not on file  . Food insecurity - inability: Not on file  . Transportation needs - medical: Not on file  . Transportation needs - non-medical: Not on file  Occupational History  . Not on file  Tobacco Use  . Smoking status: Never Smoker  . Smokeless tobacco:  Never Used  Substance and Sexual Activity  . Alcohol use: Yes    Comment: 3 drinks tonight or more   . Drug use: No  . Sexual activity: Yes  Other Topics Concern  . Not on file  Social History Narrative  . Not on file     Review of Systems: General: negative for chills, fever, night sweats or weight changes.  Cardiovascular: negative for chest pain, dyspnea on exertion, edema, orthopnea, palpitations, paroxysmal nocturnal dyspnea or shortness of breath Dermatological: negative for rash Respiratory: negative for cough or wheezing Urologic: negative for hematuria Abdominal: negative for nausea, vomiting, diarrhea, bright red blood per rectum, melena, or hematemesis Neurologic: negative for visual changes, syncope, or dizziness All other systems reviewed and are otherwise negative except as noted above.    Blood pressure 130/84, pulse 62, height 5\' 2"  (1.575 m), weight 160 lb (72.6 kg).  General appearance: alert and no distress Neck: no adenopathy, no carotid bruit, no JVD, supple, symmetrical, trachea midline and thyroid not enlarged, symmetric, no tenderness/mass/nodules Lungs: clear to auscultation bilaterally Heart: regular rate and rhythm, S1, S2 normal, no murmur, click, rub or gallop Extremities: extremities normal, atraumatic, no cyanosis or edema Pulses: 2+ and symmetric Skin: Skin color, texture, turgor normal. No rashes or lesions Neurologic: Alert and oriented X 3, normal strength and tone. Normal symmetric reflexes. Normal coordination and gait  EKG normal sinus rhythm at 62 with right bundle-branch block. I personally reviewed this EKG.  ASSESSMENT AND PLAN:   Right bundle branch block Chronic  Essential hypertension History of essential hypertension blood pressure measured 130/84. She is on olmesartan, amlodipine and hydrochlorothiazide. Continue current meds at current dosing.  Congenital heart disease History of repaired tetralogy of the low back in the  1970s. She had a 2-D echo performed 11/20/15 revealing normal LV systolic function, mild pulmonary hypertension with no evidence of a VSD. She is completely asymptomatic.      Lorretta Harp MD FACP,FACC,FAHA, [facility] 01/13/2017 11:40 AM

## 2017-01-18 ENCOUNTER — Other Ambulatory Visit: Payer: Self-pay | Admitting: Nurse Practitioner

## 2017-01-18 ENCOUNTER — Ambulatory Visit
Admission: RE | Admit: 2017-01-18 | Discharge: 2017-01-18 | Disposition: A | Discharge status: Home / Self Care | Payer: 59 | Source: Ambulatory Visit | Attending: Nurse Practitioner | Admitting: Nurse Practitioner

## 2017-01-18 DIAGNOSIS — R52 Pain, unspecified: Secondary | ICD-10-CM

## 2017-02-08 ENCOUNTER — Other Ambulatory Visit: Payer: Self-pay | Admitting: Cardiovascular Disease

## 2017-02-08 NOTE — Telephone Encounter (Signed)
REFILL 

## 2017-06-04 ENCOUNTER — Other Ambulatory Visit: Payer: Self-pay | Admitting: Cardiovascular Disease

## 2017-06-04 NOTE — Telephone Encounter (Signed)
Attempted to reach pt to verify where she wanted her refill for her Amlodipine 5mg  to go to. Re[facility] but this pharmacy is not listed in her chart. [facility] is requesting 90 day supply

## 2017-06-10 ENCOUNTER — Other Ambulatory Visit: Payer: Self-pay

## 2017-06-10 MED ORDER — AMLODIPINE BESYLATE 5 MG PO TABS: 5.0000 mg | ORAL_TABLET | Freq: Every day | ORAL | 11 refills | Status: DC

## 2017-06-10 MED ORDER — OLMESARTAN MEDOXOMIL-HCTZ 40-25 MG PO TABS: 1.0000 | ORAL_TABLET | Freq: Every day | ORAL | 11 refills | Status: DC

## 2017-08-13 LAB — HM PAP SMEAR

## 2017-08-17 ENCOUNTER — Telehealth: Payer: Self-pay

## 2017-08-17 NOTE — Telephone Encounter (Signed)
   [facility] Pre-operative Risk Assessment    Request for surgical clearance:  1. What type of surgery is being performed?  RIGHT SHOULDER SCOPE & ROTATOR CUFF REPAIR   2. When is this surgery scheduled?  TBD   3. What type of clearance is required (medical clearance vs. Pharmacy clearance to hold med vs. Both)?  MEDICAL  4. Are there any medications that need to be held prior to surgery and how long? NONE LISTED   5. Practice name and name of physician performing surgery? [facility] ATTN: [facility]  6. What is your office phone number [phone]    7.   What is your office fax number [phone]  8.   Anesthesia type (None, local, MAC, general) ?  NOT LISTED   Waylan Rocher 08/17/2017, 4:03 PM  _________________________________________________________________   (provider comments below)

## 2017-08-17 NOTE — Telephone Encounter (Signed)
   Primary Cardiologist:Jonathan Gwenlyn Found, MD  Chart reviewed as part of pre-operative protocol coverage. Because of Stephanie Garrison's past medical history and time since last visit, he/she will require a follow-up visit in order to better assess preoperative cardiovascular risk. She has history of congenital repaired heart disease and therefore will need MD rather than APP input. It appears appointment has already been made for 08/20/17 for cardiac clearance and I would recommend to keep this appointment.  Will route this update to requesting provider via Epic fax function.   Charlie Pitter, PA-C  08/17/2017, 5:28 PM

## 2017-08-20 ENCOUNTER — Encounter

## 2017-08-20 ENCOUNTER — Encounter: Payer: Self-pay | Admitting: Cardiovascular Disease

## 2017-08-20 ENCOUNTER — Ambulatory Visit (INDEPENDENT_AMBULATORY_CARE_PROVIDER_SITE_OTHER): Payer: Managed Care, Other (non HMO) | Admitting: Cardiovascular Disease

## 2017-08-20 VITALS — BP 108/70 | HR 72 | Ht 62.0 in | Wt 156.0 lb

## 2017-08-20 DIAGNOSIS — I10 Essential (primary) hypertension: Secondary | ICD-10-CM

## 2017-08-20 DIAGNOSIS — Z01818 Encounter for other preprocedural examination: Secondary | ICD-10-CM | POA: Diagnosis not present

## 2017-08-20 NOTE — Progress Notes (Signed)
08/20/2017 Stephanie Garrison   [birth year]  [id number]  Primary Physician Glendale Chard, MD Primary Cardiologist: Lorretta Harp MD FACP, [place], [place], [place]   HPI:  Stephanie Garrison is a 52 y.o.  mildly overweight married [place] American female, mother of 1 child, who I last saw  11/21/2018se has a history of repaired tetralogy of Fallot back in the 38s. Her other problems include hypertension, chronic right bundle branch block. She has been asymptomatic since I saw her last except for 1 episode of what sounds like PAF as a result of excessive alcohol intake. Her last echocardiogram performed, October 10, 2009, showed normal LV size and function with normal valvular function, and borderline dilated right ventricle with normal RV systolic function. Since I saw her a year ago she has remained entirely asymptomatic. A 2-D echo performed 11/20/15 revealed normal LV size and function, mild pulmonary hypertension with no evidence of VSD.  Since I saw her back 2 months ago she is remained asymptomatic.  She does need a right rotator cuff repair electively sometime in the near future is here for preoperative clearance.  She is a low risk operative candidate.  Current Meds  Medication Sig  . amLODipine (NORVASC) 5 MG tablet Take 1 tablet (5 mg total) by mouth daily.  Marland Kitchen levothyroxine (SYNTHROID, LEVOTHROID) 50 MCG tablet Take 1 tablet by mouth daily.  Marland Kitchen olmesartan-hydrochlorothiazide (BENICAR HCT) 40-25 MG tablet Take 1 tablet by mouth daily.     No Known Allergies  Social History   Socioeconomic History  . Marital status: Married    Spouse name: Not on file  . Number of children: Not on file  . Years of education: Not on file  . Highest education level: Not on file  Occupational History  . Not on file  Social Needs  . Financial resource strain: Not on file  . Food insecurity:    Worry: Not on file    Inability: Not on file  . Transportation needs:    Medical: Not on file   Non-medical: Not on file  Tobacco Use  . Smoking status: Never Smoker  . Smokeless tobacco: Never Used  Substance and Sexual Activity  . Alcohol use: Yes    Comment: 3 drinks tonight or more   . Drug use: No  . Sexual activity: Yes  Lifestyle  . Physical activity:    Days per week: Not on file    Minutes per session: Not on file  . Stress: Not on file  Relationships  . Social connections:    Talks on phone: Not on file    Gets together: Not on file    Attends religious service: Not on file    Active member of club or organization: Not on file    Attends meetings of clubs or organizations: Not on file    Relationship status: Not on file  . Intimate partner violence:    Fear of current or ex partner: Not on file    Emotionally abused: Not on file    Physically abused: Not on file    Forced sexual activity: Not on file  Other Topics Concern  . Not on file  Social History Narrative  . Not on file     Review of Systems: General: negative for chills, fever, night sweats or weight changes.  Cardiovascular: negative for chest pain, dyspnea on exertion, edema, orthopnea, palpitations, paroxysmal nocturnal dyspnea or shortness of breath Dermatological: negative for rash Respiratory: negative for cough  or wheezing Urologic: negative for hematuria Abdominal: negative for nausea, vomiting, diarrhea, bright red blood per rectum, melena, or hematemesis Neurologic: negative for visual changes, syncope, or dizziness All other systems reviewed and are otherwise negative except as noted above.    Blood pressure 108/70, pulse 72, height 5\' 2"  (1.575 m), weight 156 lb (70.8 kg).  General appearance: alert and no distress Neck: no adenopathy, no carotid bruit, no JVD, supple, symmetrical, trachea midline and thyroid not enlarged, symmetric, no tenderness/mass/nodules Lungs: clear to auscultation bilaterally Heart: Soft outflow tract murmur and a split P2. Extremities: extremities normal,  atraumatic, no cyanosis or edema Pulses: 2+ and symmetric Skin: Skin color, texture, turgor normal. No rashes or lesions Neurologic: Alert and oriented X 3, normal strength and tone. Normal symmetric reflexes. Normal coordination and gait  EKG sinus rhythm at 72 with right bundle branch block.  I personally reviewed this EKG.  ASSESSMENT AND PLAN:   Preoperative clearance Ms. Pryer returns today for preoperative clearance before a right rotator cuff repair scheduled to be performed electively as an outpatient.  Her only risk factor includes hypertension.  She has had Trelegy of fellow repaired back in this 70s echo performed 11/20/2015 no evidence of VSD and mild pulmonary hypertension.  She is otherwise asymptomatic cleared at low risk for her upcoming procedure.  Essential hypertension History of essential hypertension her blood pressure measured at 108/70.  She is on amlodipine, olmesartan and hydrochlorothiazide.      Lorretta Harp MD FACP,FACC,FAHA, [facility] 08/20/2017 1:38 PM

## 2017-08-20 NOTE — Patient Instructions (Signed)
Your physician wants you to follow-up in: ONE YEAR WITH DR BERRY You will receive a reminder letter in the mail two months in advance. If you don't receive a letter, please call our office to schedule the follow-up appointment.   If you need a refill on your cardiac medications before your next appointment, please call your pharmacy.  

## 2017-08-20 NOTE — Assessment & Plan Note (Signed)
History of essential hypertension her blood pressure measured at 108/70.  She is on amlodipine, olmesartan and hydrochlorothiazide.

## 2017-08-20 NOTE — Assessment & Plan Note (Signed)
Ms. Clapham returns today for preoperative clearance before a right rotator cuff repair scheduled to be performed electively as an outpatient.  Her only risk factor includes hypertension.  She has had Trelegy of fellow repaired back in this 70s echo performed 11/20/2015 no evidence of VSD and mild pulmonary hypertension.  She is otherwise asymptomatic cleared at low risk for her upcoming procedure.

## 2017-09-14 ENCOUNTER — Other Ambulatory Visit: Payer: Self-pay

## 2017-09-14 ENCOUNTER — Encounter (HOSPITAL_BASED_OUTPATIENT_CLINIC_OR_DEPARTMENT_OTHER): Payer: Self-pay | Admitting: *Deleted

## 2017-09-16 ENCOUNTER — Encounter (HOSPITAL_BASED_OUTPATIENT_CLINIC_OR_DEPARTMENT_OTHER)
Admission: RE | Admit: 2017-09-16 | Discharge: 2017-09-16 | Disposition: A | Discharge status: Home / Self Care | Payer: Managed Care, Other (non HMO) | Source: Ambulatory Visit | Attending: Orthopaedic Surgery | Admitting: Orthopaedic Surgery

## 2017-09-16 DIAGNOSIS — X58XXXA Exposure to other specified factors, initial encounter: Secondary | ICD-10-CM | POA: Diagnosis not present

## 2017-09-16 DIAGNOSIS — I10 Essential (primary) hypertension: Secondary | ICD-10-CM | POA: Diagnosis not present

## 2017-09-16 DIAGNOSIS — M7541 Impingement syndrome of right shoulder: Secondary | ICD-10-CM | POA: Insufficient documentation

## 2017-09-16 DIAGNOSIS — M948X1 Other specified disorders of cartilage, shoulder: Secondary | ICD-10-CM | POA: Diagnosis not present

## 2017-09-16 DIAGNOSIS — S43421A Sprain of right rotator cuff capsule, initial encounter: Secondary | ICD-10-CM | POA: Diagnosis not present

## 2017-09-16 DIAGNOSIS — Z01818 Encounter for other preprocedural examination: Secondary | ICD-10-CM | POA: Diagnosis not present

## 2017-09-16 LAB — BASIC METABOLIC PANEL
Anion gap: 11 (ref 5–15)
BUN: 22 mg/dL — ABNORMAL HIGH (ref 6–20)
CO2: 26 mmol/L (ref 22–32)
Calcium: 9.6 mg/dL (ref 8.9–10.3)
Chloride: 104 mmol/L (ref 98–111)
Creatinine, Ser: 0.75 mg/dL (ref 0.44–1.00)
GFR calc Af Amer: >60 mL/min (ref >60)
GFR calc non Af Amer: >60 mL/min (ref >60)
Glucose, Bld: 102 mg/dL — ABNORMAL HIGH (ref 70–99)
Potassium: 3.3 mmol/L — ABNORMAL LOW (ref 3.5–5.1)
Sodium: 141 mmol/L (ref 135–145)

## 2017-09-17 NOTE — Progress Notes (Signed)
Lab results reviewed by Dr. Gifford Shave, will proceed with surgery as scheduled.

## 2017-09-19 NOTE — Anesthesia Preprocedure Evaluation (Addendum)
Anesthesia Evaluation  Patient identified by MRN, date of birth, ID band Patient awake    Reviewed: Allergy & Precautions, NPO status , Patient's Chart, lab work & pertinent test results  History of Anesthesia Complications Negative for: history of anesthetic complications  Airway Mallampati: II  TM Distance: >3 FB Neck ROM: Full    Dental  (+) Dental Advisory Given, Teeth Intact   Pulmonary neg pulmonary ROS,    breath sounds clear to auscultation       Cardiovascular Exercise Tolerance: Good hypertension, Pt. 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 EF 55% to 60%. Trivial AI. RV and RA were mildly dilated. No residual PS or VSD. PASP: 33 mm Hg     Neuro/Psych negative neurological ROS  negative psych ROS   GI/Hepatic Neg liver ROS, GERD  Controlled and Medicated,  Endo/Other  Hypothyroidism   Renal/GU negative Renal ROS  negative genitourinary   Musculoskeletal negative musculoskeletal ROS (+)   Abdominal   Peds  Hematology negative hematology ROS (+)   Anesthesia Other Findings   Reproductive/Obstetrics                            Anesthesia Physical Anesthesia Plan  ASA: II  Anesthesia Plan: General   Post-op Pain Management:  Regional for Post-op pain   Induction: Intravenous  PONV Risk Score and Plan: 3 and Treatment may vary due to age or medical condition, Ondansetron, Dexamethasone and Midazolam  Airway Management Planned: Oral ETT  Additional Equipment: None  Intra-op Plan:   Post-operative Plan: Extubation in OR  Informed Consent: I have reviewed the patients History and Physical, chart, labs and discussed the procedure including the risks, benefits and alternatives for the proposed anesthesia with the patient or authorized representative who has indicated his/her understanding and acceptance.    Dental advisory given  Plan Discussed with: CRNA and Anesthesiologist  Anesthesia Plan Comments:        Anesthesia Quick Evaluation

## 2017-09-20 ENCOUNTER — Ambulatory Visit (HOSPITAL_BASED_OUTPATIENT_CLINIC_OR_DEPARTMENT_OTHER)
Admission: RE | Admit: 2017-09-20 | Discharge: 2017-09-20 | Disposition: A | Discharge status: Home / Self Care | Payer: Managed Care, Other (non HMO) | Source: Ambulatory Visit | Attending: Orthopaedic Surgery | Admitting: Orthopaedic Surgery

## 2017-09-20 ENCOUNTER — Ambulatory Visit (HOSPITAL_BASED_OUTPATIENT_CLINIC_OR_DEPARTMENT_OTHER): Payer: Managed Care, Other (non HMO) | Admitting: Anesthesiology

## 2017-09-20 ENCOUNTER — Other Ambulatory Visit: Payer: Self-pay

## 2017-09-20 ENCOUNTER — Encounter (HOSPITAL_BASED_OUTPATIENT_CLINIC_OR_DEPARTMENT_OTHER)
Admission: RE | Disposition: A | Discharge status: Home / Self Care | Payer: Self-pay | Source: Ambulatory Visit | Attending: Orthopaedic Surgery

## 2017-09-20 ENCOUNTER — Encounter (HOSPITAL_BASED_OUTPATIENT_CLINIC_OR_DEPARTMENT_OTHER): Payer: Self-pay

## 2017-09-20 DIAGNOSIS — Z951 Presence of aortocoronary bypass graft: Secondary | ICD-10-CM | POA: Insufficient documentation

## 2017-09-20 DIAGNOSIS — Z79899 Other long term (current) drug therapy: Secondary | ICD-10-CM | POA: Insufficient documentation

## 2017-09-20 DIAGNOSIS — S46011A Strain of muscle(s) and tendon(s) of the rotator cuff of right shoulder, initial encounter: Secondary | ICD-10-CM | POA: Diagnosis not present

## 2017-09-20 DIAGNOSIS — Z9889 Other specified postprocedural states: Secondary | ICD-10-CM | POA: Diagnosis not present

## 2017-09-20 DIAGNOSIS — I10 Essential (primary) hypertension: Secondary | ICD-10-CM | POA: Diagnosis not present

## 2017-09-20 DIAGNOSIS — K219 Gastro-esophageal reflux disease without esophagitis: Secondary | ICD-10-CM | POA: Insufficient documentation

## 2017-09-20 DIAGNOSIS — I4510 Unspecified right bundle-branch block: Secondary | ICD-10-CM | POA: Diagnosis not present

## 2017-09-20 DIAGNOSIS — E785 Hyperlipidemia, unspecified: Secondary | ICD-10-CM | POA: Insufficient documentation

## 2017-09-20 DIAGNOSIS — E039 Hypothyroidism, unspecified: Secondary | ICD-10-CM | POA: Insufficient documentation

## 2017-09-20 DIAGNOSIS — X58XXXA Exposure to other specified factors, initial encounter: Secondary | ICD-10-CM | POA: Insufficient documentation

## 2017-09-20 DIAGNOSIS — M7541 Impingement syndrome of right shoulder: Secondary | ICD-10-CM | POA: Diagnosis not present

## 2017-09-20 DIAGNOSIS — M948X1 Other specified disorders of cartilage, shoulder: Secondary | ICD-10-CM | POA: Insufficient documentation

## 2017-09-20 HISTORY — DX: Gastro-esophageal reflux disease without esophagitis: K21.9

## 2017-09-20 HISTORY — PX: SHOULDER ARTHROSCOPY WITH SUBACROMIAL DECOMPRESSION, ROTATOR CUFF REPAIR AND BICEP TENDON REPAIR: SHX5687

## 2017-09-20 HISTORY — DX: Hypothyroidism, unspecified: E03.9

## 2017-09-20 SURGERY — SHOULDER ARTHROSCOPY WITH SUBACROMIAL DECOMPRESSION, ROTATOR CUFF REPAIR AND BICEP TENDON REPAIR
Anesthesia: General | Site: Shoulder | Laterality: Right

## 2017-09-20 MED ORDER — SCOPOLAMINE 1 MG/3DAYS TD PT72: 1.0000 | MEDICATED_PATCH | Freq: Once | TRANSDERMAL | Status: DC | PRN

## 2017-09-20 MED ORDER — SUGAMMADEX SODIUM 200 MG/2ML IV SOLN
INTRAVENOUS | Status: AC
  Filled 2017-09-20: qty 2

## 2017-09-20 MED ORDER — ROCURONIUM BROMIDE 10 MG/ML (PF) SYRINGE
PREFILLED_SYRINGE | INTRAVENOUS | Status: AC
Start: 2017-09-20 — End: ?
  Filled 2017-09-20: qty 10

## 2017-09-20 MED ORDER — FENTANYL CITRATE (PF) 100 MCG/2ML IJ SOLN: 25.0000 ug | INTRAMUSCULAR | Status: DC | PRN

## 2017-09-20 MED ORDER — DEXAMETHASONE SODIUM PHOSPHATE 10 MG/ML IJ SOLN
INTRAMUSCULAR | Status: AC
  Filled 2017-09-20: qty 1

## 2017-09-20 MED ORDER — SODIUM CHLORIDE 0.9 % IV SOLN
INTRAVENOUS | Status: DC | PRN
  Administered 2017-09-20: 50 ug/min via INTRAVENOUS

## 2017-09-20 MED ORDER — OXYCODONE HCL 5 MG/5ML PO SOLN: 5.0000 mg | Freq: Once | ORAL | Status: DC | PRN

## 2017-09-20 MED ORDER — FENTANYL CITRATE (PF) 100 MCG/2ML IJ SOLN
50.0000 ug | INTRAMUSCULAR | Status: DC | PRN
  Administered 2017-09-20: 50 ug via INTRAVENOUS

## 2017-09-20 MED ORDER — EPHEDRINE SULFATE 50 MG/ML IJ SOLN
INTRAMUSCULAR | Status: AC
Start: 2017-09-20 — End: ?
  Filled 2017-09-20: qty 1

## 2017-09-20 MED ORDER — PROPOFOL 10 MG/ML IV BOLUS
INTRAVENOUS | Status: DC | PRN
  Administered 2017-09-20: 180 mg via INTRAVENOUS

## 2017-09-20 MED ORDER — ROCURONIUM BROMIDE 100 MG/10ML IV SOLN
INTRAVENOUS | Status: DC | PRN
  Administered 2017-09-20: 50 mg via INTRAVENOUS

## 2017-09-20 MED ORDER — OXYCODONE HCL 5 MG PO TABS: 5.0000 mg | ORAL_TABLET | Freq: Once | ORAL | Status: DC | PRN

## 2017-09-20 MED ORDER — PHENYLEPHRINE HCL 10 MG/ML IJ SOLN
INTRAMUSCULAR | Status: DC | PRN
  Administered 2017-09-20: 40 ug via INTRAVENOUS

## 2017-09-20 MED ORDER — PROPOFOL 500 MG/50ML IV EMUL
INTRAVENOUS | Status: AC
  Filled 2017-09-20: qty 50

## 2017-09-20 MED ORDER — BUPIVACAINE HCL (PF) 0.25 % IJ SOLN
INTRAMUSCULAR | Status: AC
  Filled 2017-09-20: qty 30

## 2017-09-20 MED ORDER — LIDOCAINE HCL (PF) 1 % IJ SOLN
INTRAMUSCULAR | Status: AC
Start: 2017-09-20 — End: ?
  Filled 2017-09-20: qty 5

## 2017-09-20 MED ORDER — FENTANYL CITRATE (PF) 100 MCG/2ML IJ SOLN
INTRAMUSCULAR | Status: AC
  Filled 2017-09-20: qty 2

## 2017-09-20 MED ORDER — LIDOCAINE HCL (CARDIAC) PF 100 MG/5ML IV SOSY
PREFILLED_SYRINGE | INTRAVENOUS | Status: DC | PRN
  Administered 2017-09-20: 80 mg via INTRAVENOUS

## 2017-09-20 MED ORDER — ONDANSETRON HCL 4 MG/2ML IJ SOLN
INTRAMUSCULAR | Status: DC | PRN
  Administered 2017-09-20: 4 mg via INTRAVENOUS

## 2017-09-20 MED ORDER — ACETAMINOPHEN 500 MG PO TABS: 1000.0000 mg | ORAL_TABLET | Freq: Three times a day (TID) | ORAL | 0 refills | Status: AC

## 2017-09-20 MED ORDER — ONDANSETRON HCL 4 MG PO TABS: 4.0000 mg | ORAL_TABLET | Freq: Three times a day (TID) | ORAL | 1 refills | Status: AC | PRN

## 2017-09-20 MED ORDER — CEFAZOLIN SODIUM-DEXTROSE 2-4 GM/100ML-% IV SOLN
INTRAVENOUS | Status: AC
  Filled 2017-09-20: qty 100

## 2017-09-20 MED ORDER — MIDAZOLAM HCL 2 MG/2ML IJ SOLN
1.0000 mg | INTRAMUSCULAR | Status: DC | PRN
  Administered 2017-09-20: 2 mg via INTRAVENOUS

## 2017-09-20 MED ORDER — MELOXICAM 7.5 MG PO TABS: 7.5000 mg | ORAL_TABLET | Freq: Every day | ORAL | 2 refills | Status: DC

## 2017-09-20 MED ORDER — LIDOCAINE HCL (CARDIAC) PF 100 MG/5ML IV SOSY
PREFILLED_SYRINGE | INTRAVENOUS | Status: AC
  Filled 2017-09-20: qty 5

## 2017-09-20 MED ORDER — ONDANSETRON HCL 4 MG/2ML IJ SOLN
INTRAMUSCULAR | Status: AC
  Filled 2017-09-20: qty 2

## 2017-09-20 MED ORDER — ROPIVACAINE HCL 7.5 MG/ML IJ SOLN
INTRAMUSCULAR | Status: DC | PRN
  Administered 2017-09-20: 20 mL via PERINEURAL

## 2017-09-20 MED ORDER — SUGAMMADEX SODIUM 200 MG/2ML IV SOLN
INTRAVENOUS | Status: DC | PRN
  Administered 2017-09-20: 200 mg via INTRAVENOUS

## 2017-09-20 MED ORDER — LACTATED RINGERS IV SOLN
INTRAVENOUS | Status: DC
  Administered 2017-09-20: 07:00:00 via INTRAVENOUS

## 2017-09-20 MED ORDER — PHENYLEPHRINE HCL 10 MG/ML IJ SOLN
INTRAMUSCULAR | Status: AC
  Filled 2017-09-20: qty 1

## 2017-09-20 MED ORDER — PROMETHAZINE HCL 25 MG/ML IJ SOLN: 6.2500 mg | INTRAMUSCULAR | Status: DC | PRN

## 2017-09-20 MED ORDER — OXYCODONE HCL 5 MG PO TABS: ORAL_TABLET | ORAL | 0 refills | Status: AC

## 2017-09-20 MED ORDER — CHLORHEXIDINE GLUCONATE 4 % EX LIQD: 60.0000 mL | Freq: Once | CUTANEOUS | Status: DC

## 2017-09-20 MED ORDER — DEXAMETHASONE SODIUM PHOSPHATE 4 MG/ML IJ SOLN
INTRAMUSCULAR | Status: DC | PRN
  Administered 2017-09-20: 10 mg via INTRAVENOUS

## 2017-09-20 MED ORDER — CEFAZOLIN SODIUM-DEXTROSE 2-4 GM/100ML-% IV SOLN
2.0000 g | INTRAVENOUS | Status: AC
  Administered 2017-09-20: 2 g via INTRAVENOUS

## 2017-09-20 MED ORDER — MIDAZOLAM HCL 2 MG/2ML IJ SOLN
INTRAMUSCULAR | Status: AC
  Filled 2017-09-20: qty 2

## 2017-09-20 MED ORDER — EPHEDRINE SULFATE 50 MG/ML IJ SOLN
INTRAMUSCULAR | Status: DC | PRN
  Administered 2017-09-20: 10 mg via INTRAVENOUS

## 2017-09-20 MED ORDER — PHENYLEPHRINE 40 MCG/ML (10ML) SYRINGE FOR IV PUSH (FOR BLOOD PRESSURE SUPPORT)
PREFILLED_SYRINGE | INTRAVENOUS | Status: AC
  Filled 2017-09-20: qty 10

## 2017-09-20 SURGICAL SUPPLY — 79 items
AID PSTN UNV HD RSTRNT DISP (MISCELLANEOUS) ×1
ANCH SUT 2 FBRWR FBRTK TGTL (Anchor) ×2 IMPLANT
ANCH SUT SWLK 19.1X4.75 (Anchor) ×4 IMPLANT
ANCHOR SUT BIO SW 4.75X19.1 (Anchor) ×4 IMPLANT
ANCHOR SUT FBRTK 2 TIGTAIL (Anchor) ×2 IMPLANT
APL SKNCLS STERI-STRIP NONHPOA (GAUZE/BANDAGES/DRESSINGS) ×1
BENZOIN TINCTURE PRP APPL 2/3 (GAUZE/BANDAGES/DRESSINGS) ×2 IMPLANT
BLADE EXCALIBUR 4.0X13 (MISCELLANEOUS) ×2 IMPLANT
BLADE HEX COATED 2.75 (ELECTRODE) ×2 IMPLANT
BLADE SHAVER BONE 5.0X13 (MISCELLANEOUS) IMPLANT
BLADE SURG 10 STRL SS (BLADE) ×2 IMPLANT
BNDG COHESIVE 4X5 TAN STRL (GAUZE/BANDAGES/DRESSINGS) IMPLANT
BURR OVAL 8 FLU 4.0X13 (MISCELLANEOUS) ×1 IMPLANT
CANNULA 5.75X71 LONG (CANNULA) ×1 IMPLANT
CANNULA PASSPORT BUTTON 10-40 (CANNULA) ×2 IMPLANT
CANNULA TWIST IN 8.25X7CM (CANNULA) IMPLANT
CHLORAPREP W/TINT 26ML (MISCELLANEOUS) ×2 IMPLANT
DECANTER SPIKE VIAL GLASS SM (MISCELLANEOUS) IMPLANT
DISSECTOR 3.5MM X 13CM CVD (MISCELLANEOUS) IMPLANT
DISSECTOR 4.0MMX13CM CVD (MISCELLANEOUS) ×1 IMPLANT
DRAPE IMP U-DRAPE 54X76 (DRAPES) ×2 IMPLANT
DRAPE INCISE IOBAN 66X45 STRL (DRAPES) IMPLANT
DRAPE STERI 35X30 U-POUCH (DRAPES) ×2 IMPLANT
DRAPE U-SHAPE 76X120 STRL (DRAPES) ×4 IMPLANT
DRSG PAD ABDOMINAL 8X10 ST (GAUZE/BANDAGES/DRESSINGS) ×2 IMPLANT
ELECT NDL TIP 2.8 STRL (NEEDLE) IMPLANT
ELECT NEEDLE TIP 2.8 STRL (NEEDLE) IMPLANT
ELECT REM PT RETURN 9FT ADLT (ELECTROSURGICAL) ×2
ELECTRODE REM PT RTRN 9FT ADLT (ELECTROSURGICAL) ×1 IMPLANT
GAUZE SPONGE 4X4 12PLY STRL (GAUZE/BANDAGES/DRESSINGS) ×2 IMPLANT
GLOVE BIO SURGEON STRL SZ 6.5 (GLOVE) ×1 IMPLANT
GLOVE BIOGEL PI IND STRL 7.0 (GLOVE) IMPLANT
GLOVE BIOGEL PI IND STRL 8 (GLOVE) ×1 IMPLANT
GLOVE BIOGEL PI INDICATOR 7.0 (GLOVE) ×2
GLOVE BIOGEL PI INDICATOR 8 (GLOVE) ×2
GLOVE ECLIPSE 8.0 STRL XLNG CF (GLOVE) ×2 IMPLANT
GOWN STRL REUS W/ TWL LRG LVL3 (GOWN DISPOSABLE) ×2 IMPLANT
GOWN STRL REUS W/TWL LRG LVL3 (GOWN DISPOSABLE) ×4
GOWN STRL REUS W/TWL XL LVL3 (GOWN DISPOSABLE) ×2 IMPLANT
KIT SPEAR STR 1.6MM DRILL (MISCELLANEOUS) ×1 IMPLANT
KIT STABILIZATION SHOULDER (MISCELLANEOUS) ×2 IMPLANT
LASSO CRESCENT QUICKPASS (SUTURE) IMPLANT
MANIFOLD NEPTUNE II (INSTRUMENTS) ×2 IMPLANT
NDL SAFETY ECLIPSE 18X1.5 (NEEDLE) ×1 IMPLANT
NDL SCORPION MULTI FIRE (NEEDLE) IMPLANT
NDL SUT 6 .5 CRC .975X.05 MAYO (NEEDLE) IMPLANT
NEEDLE HYPO 18GX1.5 SHARP (NEEDLE) ×2
NEEDLE MAYO TAPER (NEEDLE)
NEEDLE SCORPION MULTI FIRE (NEEDLE) ×2 IMPLANT
PACK ARTHROSCOPY DSU (CUSTOM PROCEDURE TRAY) ×2 IMPLANT
PACK BASIN DAY SURGERY FS (CUSTOM PROCEDURE TRAY) ×2 IMPLANT
PAD ORTHO SHOULDER 7X19 LRG (SOFTGOODS) ×1 IMPLANT
PENCIL BUTTON HOLSTER BLD 10FT (ELECTRODE) IMPLANT
PROBE BIPOLAR ATHRO 135MM 90D (MISCELLANEOUS) ×1 IMPLANT
RESTRAINT HEAD UNIVERSAL NS (MISCELLANEOUS) ×2 IMPLANT
SHEET MEDIUM DRAPE 40X70 STRL (DRAPES) IMPLANT
SLEEVE SCD COMPRESS KNEE MED (MISCELLANEOUS) ×2 IMPLANT
SLING ARM FOAM STRAP LRG (SOFTGOODS) IMPLANT
SLING ARM IMMOBILIZER LRG (SOFTGOODS) IMPLANT
SLING ARM IMMOBILIZER MED (SOFTGOODS) IMPLANT
SLING ARM MED ADULT FOAM STRAP (SOFTGOODS) IMPLANT
SLING ARM XL FOAM STRAP (SOFTGOODS) IMPLANT
SPONGE LAP 4X18 RFD (DISPOSABLE) IMPLANT
STRIP CLOSURE SKIN 1/2X4 (GAUZE/BANDAGES/DRESSINGS) IMPLANT
SUT FIBERWIRE #2 38 T-5 BLUE (SUTURE)
SUT MNCRL AB 4-0 PS2 18 (SUTURE) ×1 IMPLANT
SUT PDS AB 1 CT  36 (SUTURE)
SUT PDS AB 1 CT 36 (SUTURE) IMPLANT
SUT TIGER TAPE 7 IN WHITE (SUTURE) ×1 IMPLANT
SUTURE FIBERWR #2 38 T-5 BLUE (SUTURE) IMPLANT
SUTURE TAPE TIGERLINK 1.3MM BL (SUTURE) IMPLANT
SUTURETAPE TIGERLINK 1.3MM BL (SUTURE)
SYR 5ML LUER SLIP (SYRINGE) ×2 IMPLANT
TAPE FIBER 2MM 7IN #2 BLUE (SUTURE) ×1 IMPLANT
TOWEL GREEN STERILE FF (TOWEL DISPOSABLE) ×2 IMPLANT
TOWEL OR NON WOVEN STRL DISP B (DISPOSABLE) ×2 IMPLANT
TUBE CONNECTING 20X1/4 (TUBING) ×4 IMPLANT
TUBE SUCTION HIGH CAP CLEAR NV (SUCTIONS) IMPLANT
TUBING ARTHROSCOPY IRRIG 16FT (MISCELLANEOUS) ×2 IMPLANT

## 2017-09-20 NOTE — Anesthesia Procedure Notes (Signed)
Anesthesia Regional Block: Interscalene brachial plexus block   Pre-Anesthetic Checklist: ,, timeout performed, Correct Patient, Correct Site, Correct Laterality, Correct Procedure, Correct Position, site marked, Risks and benefits discussed,  Surgical consent,  Pre-op evaluation,  At surgeon's request and post-op pain management  Laterality: Right  Prep: chloraprep       Needles:  Injection technique: Single-shot  Needle Type: Echogenic Needle     Needle Length: 5cm  Needle Gauge: 21     Additional Needles:   Narrative:  Start time: 09/20/2017 7:11 AM End time: 09/20/2017 7:14 AM Injection made incrementally with aspirations every 5 mL.  Performed by: Personally  Anesthesiologist: Audry Pili, MD  Additional Notes: No pain on injection. No increased resistance to injection. Injection made in 5cc increments. Good needle visualization. Patient tolerated the procedure well.

## 2017-09-20 NOTE — Anesthesia Postprocedure Evaluation (Signed)
Anesthesia Post Note  Patient: Stephanie Garrison  Procedure(s) Performed: RIGHT SHOULDER ARTHROSCOPY WITH DEBRIDEMENT EXTENSIVE, SUBACROMIAL DECOMPRESSION, ROTATOR CUFF REPAIR AND BICEP TENODESIS (Right Shoulder)     Patient location during evaluation: PACU Anesthesia Type: General Level of consciousness: awake and alert Pain management: pain level controlled Vital Signs Assessment: post-procedure vital signs reviewed and stable Respiratory status: spontaneous breathing, nonlabored ventilation and respiratory function stable Cardiovascular status: blood pressure returned to baseline and stable Postop Assessment: no apparent nausea or vomiting Anesthetic complications: no    Last Vitals:  Vitals:   09/20/17 1045 09/20/17 1145  BP: (!) 135/100 (!) 129/95  Pulse: 88 99  Resp: 16 18  Temp:  36.4 C  SpO2: 95% 95%    Last Pain:  Vitals:   09/20/17 1145  TempSrc:   PainSc: 0-No pain                 Audry Pili

## 2017-09-20 NOTE — Progress Notes (Signed)
Assisted Dr. Brock with right, ultrasound guided, supraclavicular block. Side rails up, monitors on throughout procedure. See vital signs in flow sheet. Tolerated Procedure well. 

## 2017-09-20 NOTE — H&P (Signed)
PREOPERATIVE H&P  Chief Complaint: RIGHT SHOULDER CARTILAGE DISORDERS, IMPINGEMENT SYNDROME, ROTATOR CUFF STRAIN  HPI: Stephanie Garrison is a 52 y.o. female who presents for preoperative history and physical with a diagnosis of RIGHT SHOULDER CARTILAGE DISORDERS, IMPINGEMENT SYNDROME, ROTATOR CUFF STRAIN. Symptoms are rated as moderate to severe, and have been worsening.  This is significantly impairing activities of daily living.  Please see my clinic note for full details on this patient's care.  She has elected for surgical management.   Past Medical History:  Diagnosis Date  . GERD (gastroesophageal reflux disease)    OTC  . Hypertension   . Hypothyroidism   . RBBB (right bundle branch block)    recurrent, asymptomatic  . S/P CABG (coronary artery bypass graft) 1964   when she was 52 years old  . S/P repair of tetralogy of Fallot    2D ECHO, 10/10/2009 - EF >55%, normal to mild  . Thyroid disease    hypothyroidism   Past Surgical History:  Procedure Laterality Date  . CARDIAC SURGERY  1970   repair Tetrology of Fallot  . CESAREAN SECTION    . COLONOSCOPY    . DILATION AND CURETTAGE OF UTERUS     Social History   Socioeconomic History  . Marital status: Married    Spouse name: Not on file  . Number of children: Not on file  . Years of education: Not on file  . Highest education level: Not on file  Occupational History  . Not on file  Social Needs  . Financial resource strain: Not on file  . Food insecurity:    Worry: Not on file    Inability: Not on file  . Transportation needs:    Medical: Not on file    Non-medical: Not on file  Tobacco Use  . Smoking status: Never Smoker  . Smokeless tobacco: Never Used  Substance and Sexual Activity  . Alcohol use: Yes    Comment: 3 drinks tonight or more   . Drug use: No  . Sexual activity: Yes    Birth control/protection: Pill  Lifestyle  . Physical activity:    Days per week: Not on file    Minutes per session: Not  on file  . Stress: Not on file  Relationships  . Social connections:    Talks on phone: Not on file    Gets together: Not on file    Attends religious service: Not on file    Active member of club or organization: Not on file    Attends meetings of clubs or organizations: Not on file    Relationship status: Not on file  Other Topics Concern  . Not on file  Social History Narrative  . Not on file   Family History  Problem Relation Age of Onset  . Hyperlipidemia Mother    Not on File Prior to Admission medications   Medication Sig Start Date End Date Taking? Authorizing Provider  amLODipine (NORVASC) 5 MG tablet Take 1 tablet (5 mg total) by mouth daily. 06/10/17  Yes Lorretta Harp, MD  levothyroxine (SYNTHROID, LEVOTHROID) 50 MCG tablet Take 1 tablet by mouth daily. 10/27/15  Yes [provider]  olmesartan-hydrochlorothiazide (BENICAR HCT) 40-25 MG tablet Take 1 tablet by mouth daily. 06/10/17  Yes Lorretta Harp, MD     Positive ROS: All other systems have been reviewed and were otherwise negative with the exception of those mentioned in the HPI and as above.  Physical Exam: General:  Alert, no acute distress Cardiovascular: No pedal edema Respiratory: No cyanosis, no use of accessory musculature GI: No organomegaly, abdomen is soft and non-tender Skin: No lesions in the area of chief complaint Neurologic: Sensation intact distally Psychiatric: Patient is competent for consent with normal mood and affect Lymphatic: No axillary or cervical lymphadenopathy  MUSCULOSKELETAL: RUE: 80% motion, 4/5 supra  Assessment: RIGHT SHOULDER CARTILAGE DISORDERS, IMPINGEMENT SYNDROME, ROTATOR CUFF STRAIN  Plan: Plan for Procedure(s): RIGHT SHOULDER ARTHROSCOPY WITH DEBRIDEMENT EXTENSIVE, SUBACROMIAL DECOMPRESSION, ROTATOR CUFF REPAIR AND BICEP TENODESIS  The risks benefits and alternatives were discussed with the patient including but not limited to the risks of  nonoperative treatment, versus surgical intervention including infection, bleeding, nerve injury,  blood clots, cardiopulmonary complications, morbidity, mortality, among others, and they were willing to proceed.   Hiram Gash, MD  09/20/2017 7:11 AM

## 2017-09-20 NOTE — Op Note (Signed)
Orthopaedic Surgery Operative Note (CSN: [id number])  Stephanie Garrison  [birth year] Date of Surgery: 09/20/2017   Diagnoses:  RIGHT SHOULDER CARTILAGE DISORDERS, IMPINGEMENT SYNDROME, ROTATOR CUFF STRAIN  Procedure: Right rotator cuff repair supraspinatus  Right arthroscopic biceps tenodesis  Right subacromial decompression   Operative Finding Successful completion of planned procedure.  Distal most biceps anchor drill had malfunction and about 2 cm of it was fractured off in intramedullary canal.  Exam under anesthesia: full motion, 170, ER 60 Articular space: No loose bodies, capsule intact, labrum intact Chondral surfaces:Intact, no sign of chondral degeneration on the glenoid or humeral head Biceps: Type II SLAP tear Subscapularis: Intact Superior Cuff: Isolated full supraspinatus involvement with intact posterior teres at overlay on top of the supra. Bursal side: Type II acromion converted type I.  Post-operative plan: The patient will be weightbearing in a sling.  The patient will be discharged home.  DVT prophylaxis not indicated in isolated upper extremity surgery patient with no specific risks factors.  Pain control with PRN pain medication preferring oral medicines.  Follow up plan will be scheduled in approximately 7 days for incision check and XR.  Post-Op Diagnosis: Same Surgeons:Primary: Hiram Gash, MD Assistants:Brandon Lynnell Jude Location: [facility] OR ROOM 6 Anesthesia: General Antibiotics: Ancef 2g preop Tourniquet time: * No tourniquets in log * Estimated Blood Loss: minimal Complications: An Arthrex drill bit after use for the biceps induces had about 2 cm of the tip apparently break off in the bone.  We searched the soft tissue and noted that it was not obviously present.  We are in a region near nerves and vessels I would provide more detriment to the patient by potentially continuing to look for it and thus determined that it was better to leave this rather than  continue to potentially cause damage looking for it intramedullary.  Discussed with the family the nature of this. Specimens: None Implants: Implant Name Type Inv. Item Serial No. Manufacturer Lot No. LRB No. Used Action  [facility] SUTURE - [id number] Anchor ANCHOR [place] [id number] Right 1 Implanted  ANCHOR SUT BIO SW 4.75X19.1 - [id number] Anchor ANCHOR SUT BIO SW 4.75X19.1  [place] [id number] Right 1 Implanted  ANCHOR SUT BIO SW 4.75X19.1 - [id number] Anchor ANCHOR SUT BIO SW 4.75X19.1  [place] [id number] Right 1 Implanted  ANCHOR SUT BIO SW 4.75X19.1 - [id number] Anchor ANCHOR SUT BIO SW 4.75X19.1  [employer] [id number] Right 1 Implanted  ANCHOR SUT BIO SW 4.75X19.1 - [id number] Anchor ANCHOR SUT BIO SW 4.75X19.1  Rolinda Roan [id number] Right 1 Implanted  [facility] SUTURE - [id number] Anchor ANCHOR Clarita Leber [id number] Right 1 Implanted    Indications for Surgery:   Stephanie Garrison is a 52 y.o. female with right shoulder pain refractory to nonoperative measures including injection, NSAIDs, and physical therapy.  Benefits and risks of operative and nonoperative management were discussed prior to surgery with patient/guardian(s) and informed consent form was completed.  Specific risks including infection, need for additional surgery, nerve and vessel injury, need for further surgery, re-tear, stiffness.   Procedure:   The patient was identified in the preoperative holding area where the surgical site was marked. The patient was taken to the OR where a procedural timeout was called and the above noted anesthesia was induced.  The patient was positioned beachchair on [employer] table.  Preoperative antibiotics were dosed.  The patient's right shoulder was prepped and draped in the usual sterile fashion.  A second preoperative timeout was called.      Patient was correctly identified in the preoperative holding area and operative site marked.  Patient  brought to OR and positioned beachchair on an Rule table ensuring that all bony prominences were padded and the head was in an appropriate location.  Anesthesia was induced and the operative shoulder was prepped and draped in the usual sterile fashion.  Timeout was called preincision.  A standard posterior viewing portal was made after localizing the portal with a spinal needle.  An anterior accessory portal was also made.  After clearing the articular space the camera was positioned in the subacromial space.  Findings above.  Subacromial decompression: We made a lateral portal with spinal needle guidance. We then proceeded to debride bursal tissue extensively with a shaver and arthrocare device. At that point we continued to identify the borders of the acromion and identify the spur. We then carefully preserved the deltoid fascia and used a burr to convert the type 2 acromion to a Type 1 flat acromion without issue.  Arthroscopic Rotator Cuff Repair: Tuberosity was prepared with a burr to a bleeding bed.  Following completion of the above we placed 2 4.7 Swivelock anchor loaded with a tape at inserted at the medial articular margin and an scorpion suture passing device, shuttled  utures medially in a horizontal mattress suture configuration.  We then tied using arthroscopic knot tying techniques  each suture to its partner reducing the tendon at the prepared insertion site.  The fiber tape was not tied. With a medial row suture limbs then incorporated, 2 anteriorly and 2 posteriorly, into each of two 4.75 PEEK SwiveLock anchors, each placed 8 to 10 mm below the tip of the tuberosity and spanning anterior-posterior width of the tear with care to avoid over tensioning.   Biceps tenodesis: We marked the tendon and then performed a tenotomy and debridement of the stump in the articular space. We then identified the biceps tendon in its groove suprapec with the arthroscope in the lateral portal taking care to  move from lateral to medial to avoid injury to the subscapularis. At that point we unroofed the tendon itself and mobilized it. An accessory anterior portal was made in line with the tendon and we grasped it from the anterior superior portal and worked from the accessory anterior portal. Two Fibertak 1.32mm anchors were placed in the groove and the tendon was secured in a luggage loop style fashion with good tension on the tendon.  We did note that the distal anchor or after placement the drill bit had apparently malfunctioned and there was a small amount that was retained within the intramedullary canal.  It was determined that there was more detrimental than good to try and retrieve this.  Thus it was left in place.  The incisions were closed with absorbable monocryl, benzoin and steri strips.  A sterile dressing was placed along with a sling. The patient was awoken from general anesthesia and taken to the PACU in stable condition without complication.   Joya Gaskins, OPA-C, present and scrubbed throughout the case, critical for completion in a timely fashion, and for retraction, instrumentation, closure.

## 2017-09-20 NOTE — Anesthesia Procedure Notes (Signed)
Procedure Name: Intubation Performed by: Terrance Mass, CRNA Pre-anesthesia Checklist: Patient identified, Emergency Drugs available, Suction available and Patient being monitored Patient Re-evaluated:Patient Re-evaluated prior to induction Oxygen Delivery Method: Circle system utilized Preoxygenation: Pre-oxygenation with 100% oxygen Induction Type: IV induction Ventilation: Mask ventilation without difficulty Laryngoscope Size: Miller and 2 Grade View: Grade I Tube type: Oral Tube size: 7.0 mm Number of attempts: 1 Airway Equipment and Method: Stylet Placement Confirmation: ETT inserted through vocal cords under direct vision,  positive ETCO2 and breath sounds checked- equal and bilateral Secured at: 21 cm Tube secured with: Tape Dental Injury: Teeth and Oropharynx as per pre-operative assessment

## 2017-09-20 NOTE — Discharge Instructions (Signed)

## 2017-09-20 NOTE — Transfer of Care (Signed)
Immediate Anesthesia Transfer of Care Note  Patient: Stephanie Garrison  Procedure(s) Performed: RIGHT SHOULDER ARTHROSCOPY WITH DEBRIDEMENT EXTENSIVE, SUBACROMIAL DECOMPRESSION, ROTATOR CUFF REPAIR AND BICEP TENODESIS (Right Shoulder)  Patient Location: PACU  Anesthesia Type:General  Level of Consciousness: awake, alert  and oriented  Airway & Oxygen Therapy: Patient Spontanous Breathing and Patient connected to face mask oxygen  Post-op Assessment: Report given to RN and Post -op Vital signs reviewed and stable  Post vital signs: Reviewed and stable  Last Vitals:  Vitals Value Taken Time  BP 126/111 09/20/2017  9:48 AM  Temp    Pulse 92 09/20/2017  9:49 AM  Resp 15 09/20/2017  9:49 AM  SpO2 92 % 09/20/2017  9:49 AM  Vitals shown include unvalidated device data.  Last Pain:  Vitals:   09/20/17 0633  TempSrc: Oral  PainSc: 7       Patients Stated Pain Goal: 2 (95/97/47 1855)  Complications: No apparent anesthesia complications

## 2017-09-22 ENCOUNTER — Encounter (HOSPITAL_BASED_OUTPATIENT_CLINIC_OR_DEPARTMENT_OTHER): Payer: Self-pay | Admitting: Orthopaedic Surgery

## 2017-11-25 ENCOUNTER — Encounter: Payer: Self-pay | Admitting: Nurse Practitioner

## 2017-11-25 ENCOUNTER — Ambulatory Visit: Payer: Managed Care, Other (non HMO) | Admitting: Nurse Practitioner

## 2017-11-25 VITALS — BP 112/80 | HR 69 | Temp 98.1°F | Ht 61.0 in | Wt 169.6 lb

## 2017-11-25 DIAGNOSIS — I10 Essential (primary) hypertension: Secondary | ICD-10-CM | POA: Diagnosis not present

## 2017-11-25 DIAGNOSIS — R7309 Other abnormal glucose: Secondary | ICD-10-CM | POA: Diagnosis not present

## 2017-11-25 DIAGNOSIS — E782 Mixed hyperlipidemia: Secondary | ICD-10-CM | POA: Diagnosis not present

## 2017-11-25 DIAGNOSIS — E559 Vitamin D deficiency, unspecified: Secondary | ICD-10-CM

## 2017-11-25 DIAGNOSIS — M67911 Unspecified disorder of synovium and tendon, right shoulder: Secondary | ICD-10-CM

## 2017-11-25 DIAGNOSIS — E039 Hypothyroidism, unspecified: Secondary | ICD-10-CM | POA: Insufficient documentation

## 2017-11-25 NOTE — Patient Instructions (Signed)
   Continue current medications  Focus on healthy diet and regular exercise, get back on track.   Patient declined influenza vaccination at this time. Patient is aware that influenza vaccine prevents illness in 70% of healthy people, and reduces hospitalizations to 30-70% in elderly. This vaccine is recommended annually. Pt is willing to accept risk associated with refusing vaccination.

## 2017-11-25 NOTE — Progress Notes (Signed)
Subjective:     Patient ID: Stephanie Garrison , female    DOB: [birth year] , 52 y.o.   MRN: [id number]   She also had her rotator cuff surgery to right shoulder - September 20, 2017, now with physical therapy currently 3 times per week.  She feels better and is currently out of work.  Hypertension  This is a chronic problem. The current episode started more than 1 year ago. The problem is unchanged. Pertinent negatives include no anxiety, headaches or shortness of breath. Agents associated with hypertension include thyroid hormones. Risk factors for coronary artery disease include obesity and sedentary lifestyle. Past treatments include ACE inhibitors. There are no compliance problems.  Identifiable causes of hypertension include a thyroid problem.  Thyroid Problem  Presents for follow-up visit. Symptoms include fatigue and weight gain. Patient reports no anxiety.     Past Medical History:  Diagnosis Date  . GERD (gastroesophageal reflux disease)    OTC  . Hypertension   . Hypothyroidism   . RBBB (right bundle branch block)    recurrent, asymptomatic  . S/P CABG (coronary artery bypass graft) 1964   when she was 52 years old  . S/P repair of tetralogy of Fallot    2D ECHO, 10/10/2009 - EF >55%, normal to mild  . Thyroid disease    hypothyroidism      Current Outpatient Medications:  .  amLODipine (NORVASC) 5 MG tablet, Take 1 tablet (5 mg total) by mouth daily., Disp: 30 tablet, Rfl: 11 .  levothyroxine (SYNTHROID, LEVOTHROID) 50 MCG tablet, Take 1 tablet by mouth daily., Disp: , Rfl:  .  meloxicam (MOBIC) 7.5 MG tablet, Take 1 tablet (7.5 mg total) by mouth daily., Disp: 30 tablet, Rfl: 2 .  olmesartan-hydrochlorothiazide (BENICAR HCT) 40-25 MG tablet, Take 1 tablet by mouth daily., Disp: 30 tablet, Rfl: 11   Review of Systems  Constitutional: Positive for fatigue and weight gain.  Respiratory: Negative.  Negative for shortness of breath.   Cardiovascular: Negative.   Endocrine:  Negative for polydipsia and polyphagia.  Musculoskeletal: Negative.        Right shoulder is better since her rotator cuff surgery  Skin: Negative.   Neurological: Negative for headaches.  Psychiatric/Behavioral: The patient is not nervous/anxious.      Today's Vitals   11/25/17 0959  BP: 112/80  Pulse: 69  Temp: 98.1 F (36.7 C)  TempSrc: Oral  SpO2: 97%  Weight: 169 lb 9.6 oz (76.9 kg)  Height: 5\' 1"  (1.549 m)  PainSc: 0-No pain   Body mass index is 32.05 kg/m.   Objective:  Physical Exam  Constitutional: She appears well-developed and well-nourished.  Eyes: Pupils are equal, round, and reactive to light. Conjunctivae and EOM are normal.  Cardiovascular: Normal rate, regular rhythm, normal heart sounds and intact distal pulses.  Pulmonary/Chest: Effort normal and breath sounds normal.  Musculoskeletal: Normal range of motion.  Skin: Skin is warm and dry.        Assessment And Plan:   1. Essential hypertension  Chronic - doing well  Continue current medications  2. Acquired hypothyroidism  Chronic, good control  Continue current medications  Will check thyroid levels  3. Mixed hyperlipidemia  Chronic, stable  Continue with current regimen  4. Other abnormal glucose  Chronic, stable  No current medications  Will check HgbA1c  5. Vitamin D deficiency  Chronic, stable  Will check Vitamin d, pending results will send Rx   6. Rotator cuff disorder, right  Doing well after surgery in July  Being followed by Orthopedic Surgery  Currently going to physical therapy      [name], FNP

## 2017-11-30 LAB — HEMOGLOBIN A1C
Est. average glucose Bld gHb Est-mCnc: 117 mg/dL
Hgb A1c MFr Bld: 5.7 % — ABNORMAL HIGH (ref 4.8–5.6)

## 2017-11-30 LAB — BMP8+EGFR
BUN/Creatinine Ratio: 24 — ABNORMAL HIGH (ref 9–23)
BUN: 14 mg/dL (ref 6–24)
CO2: 28 mmol/L (ref 20–29)
Calcium: 9.6 mg/dL (ref 8.7–10.2)
Chloride: 98 mmol/L (ref 96–106)
Creatinine, Ser: 0.59 mg/dL (ref 0.57–1.00)
GFR calc Af Amer: 123 mL/min/{1.73_m2} (ref >59)
GFR calc non Af Amer: 106 mL/min/{1.73_m2} (ref >59)
Glucose: 105 mg/dL — ABNORMAL HIGH (ref 65–99)
Potassium: 3.7 mmol/L (ref 3.5–5.2)
Sodium: 140 mmol/L (ref 134–144)

## 2017-11-30 LAB — LIPID PANEL
Chol/HDL Ratio: 3.3 ratio (ref 0.0–4.4)
Cholesterol, Total: 261 mg/dL — ABNORMAL HIGH (ref 100–199)
HDL: 80 mg/dL (ref >39)
LDL Calculated: 158 mg/dL — ABNORMAL HIGH (ref 0–99)
Triglycerides: 114 mg/dL (ref 0–149)
VLDL Cholesterol Cal: 23 mg/dL (ref 5–40)

## 2017-11-30 LAB — VITAMIN D 1,25 DIHYDROXY
Vitamin D 1, 25 (OH)2 Total: 67 pg/mL — ABNORMAL HIGH
Vitamin D2 1, 25 (OH)2: 22 pg/mL
Vitamin D3 1, 25 (OH)2: 45 pg/mL

## 2017-11-30 LAB — T3, FREE: T3, Free: 2.8 pg/mL (ref 2.0–4.4)

## 2017-11-30 LAB — TSH: TSH: 1.39 u[IU]/mL (ref 0.450–4.500)

## 2017-11-30 LAB — T4: T4, Total: 8.4 ug/dL (ref 4.5–12.0)

## 2017-12-12 ENCOUNTER — Other Ambulatory Visit: Payer: Self-pay | Admitting: Nurse Practitioner

## 2018-01-25 ENCOUNTER — Other Ambulatory Visit: Payer: Self-pay

## 2018-01-25 MED ORDER — LEVOTHYROXINE SODIUM 50 MCG PO TABS: 50.0000 ug | ORAL_TABLET | Freq: Every day | ORAL | 1 refills | Status: DC

## 2018-01-25 MED ORDER — AMLODIPINE BESYLATE 5 MG PO TABS: 5.0000 mg | ORAL_TABLET | Freq: Every day | ORAL | 1 refills | Status: DC

## 2018-01-25 MED ORDER — OLMESARTAN MEDOXOMIL-HCTZ 40-25 MG PO TABS: 1.0000 | ORAL_TABLET | Freq: Every day | ORAL | 1 refills | Status: DC

## 2018-01-27 ENCOUNTER — Other Ambulatory Visit: Payer: Self-pay

## 2018-01-27 MED ORDER — OLMESARTAN MEDOXOMIL-HCTZ 40-25 MG PO TABS: 1.0000 | ORAL_TABLET | Freq: Every day | ORAL | 1 refills | Status: DC

## 2018-01-27 MED ORDER — LEVOTHYROXINE SODIUM 50 MCG PO TABS: 50.0000 ug | ORAL_TABLET | Freq: Every day | ORAL | 1 refills | Status: DC

## 2018-01-27 MED ORDER — AMLODIPINE BESYLATE 5 MG PO TABS: 5.0000 mg | ORAL_TABLET | Freq: Every day | ORAL | 1 refills | Status: DC

## 2018-03-04 ENCOUNTER — Ambulatory Visit: Payer: Managed Care, Other (non HMO) | Admitting: Nurse Practitioner

## 2018-03-14 ENCOUNTER — Telehealth: Payer: Self-pay | Admitting: Cardiovascular Disease

## 2018-03-14 MED ORDER — AMOXICILLIN 500 MG PO CAPS: 2000.0000 mg | ORAL_CAPSULE | Freq: Once | ORAL | 0 refills | Status: AC

## 2018-03-14 NOTE — Telephone Encounter (Signed)
  1. What dental office are you calling from? [facility]  2. What is your office phone number? [phone]  3. What is your fax number? [phone]  4. What type of procedure is the patient having performed? cleaning  5. What date is procedure scheduled or is the patient there now? 03/15/18 @ 10:00 am (if the patient is at the dentist's office question goes to their cardiologist if he/she is in the office.  If not, question should go to the DOD).   6. What is your question (ex. Antibiotics prior to procedure, holding medication-we need to know how long dentist wants pt to hold med)? Does patient need premed? If so, can Dr Gwenlyn Found call in the meds to [facility] [phone]?

## 2018-03-14 NOTE — Telephone Encounter (Signed)
Returned call to pt's dentist office at [place]. LMTCB. Order for amoxicillin 2,000mg  for SBE prophylaxis for upcoming dental cleaning sent to requested pharmacy

## 2018-03-14 NOTE — Telephone Encounter (Signed)
Received call from [facility] for Dr. Annitta Jersey at [place] and [facility] [place]. Larene Beach requested documentation for recommended SBE prophylaxis medication. Documentation faxed to number she provided: [phone]

## 2018-03-24 ENCOUNTER — Encounter: Payer: Self-pay | Admitting: Nurse Practitioner

## 2018-03-24 ENCOUNTER — Ambulatory Visit: Payer: Managed Care, Other (non HMO) | Admitting: Nurse Practitioner

## 2018-03-24 VITALS — BP 112/80 | HR 75 | Temp 98.2°F | Ht 61.0 in | Wt 168.8 lb

## 2018-03-24 DIAGNOSIS — E559 Vitamin D deficiency, unspecified: Secondary | ICD-10-CM

## 2018-03-24 DIAGNOSIS — Z1231 Encounter for screening mammogram for malignant neoplasm of breast: Secondary | ICD-10-CM

## 2018-03-24 DIAGNOSIS — Q249 Congenital malformation of heart, unspecified: Secondary | ICD-10-CM

## 2018-03-24 DIAGNOSIS — E039 Hypothyroidism, unspecified: Secondary | ICD-10-CM | POA: Diagnosis not present

## 2018-03-24 DIAGNOSIS — Z139 Encounter for screening, unspecified: Secondary | ICD-10-CM | POA: Diagnosis not present

## 2018-03-24 DIAGNOSIS — Z0000 Encounter for general adult medical examination without abnormal findings: Secondary | ICD-10-CM | POA: Diagnosis not present

## 2018-03-24 DIAGNOSIS — I10 Essential (primary) hypertension: Secondary | ICD-10-CM | POA: Diagnosis not present

## 2018-03-24 LAB — POCT URINALYSIS DIPSTICK
Bilirubin, UA: NEGATIVE
Glucose, UA: NEGATIVE
Ketones, UA: NEGATIVE
Leukocytes, UA: NEGATIVE
Nitrite, UA: NEGATIVE
Protein, UA: NEGATIVE
Spec Grav, UA: 1.02 (ref 1.010–1.025)
Urobilinogen, UA: 0.2 E.U./dL
pH, UA: 6.5 (ref 5.0–8.0)

## 2018-03-24 LAB — POCT UA - MICROALBUMIN
Albumin/Creatinine Ratio, Urine, POC: 30
Creatinine, POC: 200 mg/dL
Microalbumin Ur, POC: 30 mg/L

## 2018-03-24 NOTE — Progress Notes (Signed)
Subjective:     Patient ID: Stephanie Garrison , female    DOB: 1965/03/09 , 53 y.o.   MRN: [id number]   Chief Complaint  Patient presents with  . Annual Exam   The patient states she uses none for birth control. Last LMP was Patient's last menstrual period was 03/09/2018.. Negative for Dysmenorrhea and Negative for Menorrhagia , menstrual cycles are irregular.  Mammogram last done 2015 noted in computer. Negative for: breast discharge, breast lump(s), breast pain and breast self exam.  Pertinent negatives include abnormal bleeding (hematology), anxiety, decreased libido, depression, difficulty falling sleep, dyspareunia, history of infertility, nocturia, sexual dysfunction, sleep disturbances, urinary incontinence, urinary urgency, vaginal discharge and vaginal itching. Diet regular.The patient states her exercise level is  1 day a week exercise.     The patient's tobacco use is:  Social History   Tobacco Use  Smoking Status Never Smoker  Smokeless Tobacco Never Used  . She has been exposed to passive smoke. The patient's alcohol use is:  Social History   Substance and Sexual Activity  Alcohol Use Yes   Comment: 3 drinks tonight or more    Additional information: Last pap 2019 per patient, next one scheduled for 2022.   HPI  Here for HM  Thyroid Problem  Presents for follow-up visit. Patient reports no anxiety, fatigue or palpitations.  Hypertension  This is a chronic problem. The current episode started more than 1 year ago. The problem is unchanged. The problem is controlled. Pertinent negatives include no anxiety, chest pain, headaches or palpitations. There are no associated agents to hypertension. Risk factors for coronary artery disease include sedentary lifestyle. Past treatments include angiotensin blockers and diuretics. There are no compliance problems.  Identifiable causes of hypertension include a thyroid problem.  Otalgia   There is pain in the right ear. This is a  new problem. The current episode started more than 1 month ago. The problem occurs every few hours. There has been no fever. Pertinent negatives include no abdominal pain, ear discharge or headaches. Associated symptoms comments: Seen by dentist who felt swollen gland on the right. She has tried nothing for the symptoms. The treatment provided no relief.     Past Medical History:  Diagnosis Date  . GERD (gastroesophageal reflux disease)    OTC  . Hypertension   . Hypothyroidism   . RBBB (right bundle branch block)    recurrent, asymptomatic  . S/P CABG (coronary artery bypass graft) 1964   when she was 53 years old  . S/P repair of tetralogy of Fallot    2D ECHO, 10/10/2009 - EF >55%, normal to mild  . Thyroid disease    hypothyroidism     Family History  Problem Relation Age of Onset  . Hyperlipidemia Mother      Current Outpatient Medications:  .  amLODipine (NORVASC) 5 MG tablet, Take 1 tablet (5 mg total) by mouth daily., Disp: 90 tablet, Rfl: 1 .  levothyroxine (SYNTHROID, LEVOTHROID) 50 MCG tablet, Take 1 tablet (50 mcg total) by mouth daily., Disp: 90 tablet, Rfl: 1 .  olmesartan-hydrochlorothiazide (BENICAR HCT) 40-25 MG tablet, Take 1 tablet by mouth daily., Disp: 90 tablet, Rfl: 1   No Known Allergies   Review of Systems  Constitutional: Negative.  Negative for fatigue.  HENT: Positive for ear pain. Negative for ear discharge.   Eyes: Negative.   Respiratory: Negative.   Cardiovascular: Negative.  Negative for chest pain, palpitations and leg swelling.  Gastrointestinal: Negative.  Negative for abdominal pain.  Endocrine: Negative.   Genitourinary: Negative.   Musculoskeletal: Negative.   Skin: Negative.   Allergic/Immunologic: Negative.   Neurological: Negative.  Negative for headaches.  Hematological: Negative.   Psychiatric/Behavioral: Negative.  The patient is not nervous/anxious.      Today's Vitals   03/24/18 0918  BP: 112/80  Pulse: 75  Temp: 98.2  F (36.8 C)  TempSrc: Oral  SpO2: 96%  Weight: 168 lb 12.8 oz (76.6 kg)  Height: 5\' 1"  (1.549 m)  PainSc: 2   PainLoc: Ear   Body mass index is 31.89 kg/m.   Objective:  Physical Exam Constitutional:      Appearance: Normal appearance. She is well-developed.  HENT:     Head: Normocephalic and atraumatic.     Right Ear: Hearing, ear canal and external ear normal. There is no impacted cerumen. Tympanic membrane is bulging.     Left Ear: Hearing, ear canal and external ear normal. There is no impacted cerumen. Tympanic membrane is bulging.     Nose: Nose normal. No congestion.     Mouth/Throat:     Mouth: Mucous membranes are moist.  Eyes:     General: Lids are normal.     Conjunctiva/sclera: Conjunctivae normal.     Pupils: Pupils are equal, round, and reactive to light.     Funduscopic exam:    Right eye: No papilledema.        Left eye: No papilledema.  Neck:     Musculoskeletal: Full passive range of motion without pain, normal range of motion and neck supple.     Thyroid: No thyroid mass.     Vascular: No carotid bruit.  Cardiovascular:     Rate and Rhythm: Normal rate and regular rhythm.     Pulses: Normal pulses.     Heart sounds: Normal heart sounds. No murmur.  Pulmonary:     Effort: Pulmonary effort is normal.     Breath sounds: Normal breath sounds.  Chest:     Breasts:        Right: Normal. No mass, nipple discharge or tenderness.        Left: Normal. No mass, nipple discharge or tenderness.  Abdominal:     General: Abdomen is flat. Bowel sounds are normal.     Palpations: Abdomen is soft.  Musculoskeletal: Normal range of motion.        General: No swelling.     Right lower leg: No edema.     Left lower leg: No edema.  Skin:    General: Skin is warm and dry.     Capillary Refill: Capillary refill takes less than 2 seconds.  Neurological:     General: No focal deficit present.     Mental Status: She is alert and oriented to person, place, and time.      Cranial Nerves: No cranial nerve deficit.     Sensory: No sensory deficit.  Psychiatric:        Mood and Affect: Mood normal.        Behavior: Behavior normal.        Thought Content: Thought content normal.        Judgment: Judgment normal.         Assessment And Plan:     1. Essential hypertension . B/P is controlled.  . CMP ordered to check renal function.  . The importance of regular exercise and dietary modification was stressed to the patient.  . Stressed importance of  losing ten percent of her body weight to help with B/P control.  . The weight loss would help with decreasing cardiac and cancer risk as well.   - EKG 12-Lead - POCT Urinalysis Dipstick (81002) - POCT UA - Microalbumin - Lipid panel - CMP14 + Anion Gap  2. Acquired hypothyroidism  Chronic, controlled  Continue with current medications - TSH - T3 - T4, Free  3. Encounter for screening  - HIV antibody (with reflex)  4. Encounter for screening mammogram for breast cancer  Pt instructed on Self Breast Exam.According to ACOG guidelines Women aged 53 and older are recommended to get an annual mammogram. Form completed and given to patient contact the [facility] for appointment scheduing.   Pt encouraged to get annual mammogram  No abnormal findings on breast exam  - MM Digital Screening; Future  5. Congenital heart disease  Continues to follow up with cardiology yearly  6. Vitamin D deficiency  Will check vitamin D level and supplement as needed.     Also encouraged to spend 15 minutes in the sun daily.  - Vitamin D (25 hydroxy)  7. Health maintenance examination . Behavior modifications discussed and diet history reviewed.   . Pt will continue to exercise regularly and modify diet with low GI, plant based foods and decrease intake of processed foods.  . Recommend intake of daily multivitamin, Vitamin D, and calcium.  . Recommend mammogram  for preventive screenings, as well as  recommend immunizations that include influenza, TDAP          [name], FNP

## 2018-03-24 NOTE — Patient Instructions (Signed)

## 2018-03-25 LAB — CMP14 + ANION GAP
ALT: 20 IU/L (ref 0–32)
AST: 21 IU/L (ref 0–40)
Albumin/Globulin Ratio: 1.5 (ref 1.2–2.2)
Albumin: 4.4 g/dL (ref 3.8–4.9)
Alkaline Phosphatase: 51 IU/L (ref 39–117)
Anion Gap: 15 mmol/L (ref 10.0–18.0)
BUN/Creatinine Ratio: 19 (ref 9–23)
BUN: 15 mg/dL (ref 6–24)
Bilirubin Total: 0.6 mg/dL (ref 0.0–1.2)
CO2: 27 mmol/L (ref 20–29)
Calcium: 9.8 mg/dL (ref 8.7–10.2)
Chloride: 98 mmol/L (ref 96–106)
Creatinine, Ser: 0.77 mg/dL (ref 0.57–1.00)
GFR calc Af Amer: 103 mL/min/{1.73_m2} (ref >59)
GFR calc non Af Amer: 89 mL/min/{1.73_m2} (ref >59)
Globulin, Total: 2.9 g/dL (ref 1.5–4.5)
Glucose: 84 mg/dL (ref 65–99)
Potassium: 4.1 mmol/L (ref 3.5–5.2)
Sodium: 140 mmol/L (ref 134–144)
Total Protein: 7.3 g/dL (ref 6.0–8.5)

## 2018-03-25 LAB — T3: T3, Total: 89 ng/dL (ref 71–180)

## 2018-03-25 LAB — VITAMIN D 25 HYDROXY (VIT D DEFICIENCY, FRACTURES): Vit D, 25-Hydroxy: 20.6 ng/mL — ABNORMAL LOW (ref 30.0–100.0)

## 2018-03-25 LAB — HIV ANTIBODY (ROUTINE TESTING W REFLEX): HIV Screen 4th Generation wRfx: NONREACTIVE

## 2018-03-25 LAB — LIPID PANEL
Chol/HDL Ratio: 3.8 ratio (ref 0.0–4.4)
Cholesterol, Total: 240 mg/dL — ABNORMAL HIGH (ref 100–199)
HDL: 64 mg/dL (ref >39)
LDL Calculated: 156 mg/dL — ABNORMAL HIGH (ref 0–99)
Triglycerides: 98 mg/dL (ref 0–149)
VLDL Cholesterol Cal: 20 mg/dL (ref 5–40)

## 2018-03-25 LAB — TSH: TSH: 2.21 u[IU]/mL (ref 0.450–4.500)

## 2018-03-25 LAB — T4, FREE: Free T4: 1.59 ng/dL (ref 0.82–1.77)

## 2018-04-06 ENCOUNTER — Other Ambulatory Visit: Payer: Self-pay | Admitting: Nurse Practitioner

## 2018-04-06 MED ORDER — VITAMIN D (ERGOCALCIFEROL) 1.25 MG (50000 UNIT) PO CAPS: 50000.0000 [IU] | ORAL_CAPSULE | ORAL | 0 refills | Status: DC

## 2018-04-13 ENCOUNTER — Encounter: Payer: Self-pay | Admitting: Nurse Practitioner

## 2018-04-15 ENCOUNTER — Other Ambulatory Visit: Payer: Self-pay | Admitting: Nurse Practitioner

## 2018-04-15 MED ORDER — PRAVASTATIN SODIUM 10 MG PO TABS: 10.0000 mg | ORAL_TABLET | Freq: Every day | ORAL | 2 refills | Status: DC

## 2018-04-15 NOTE — Progress Notes (Signed)
I had to change to pravastatin 10mg  due to insurance coverage.

## 2018-04-15 NOTE — Progress Notes (Signed)
I am starting her on simvastatin 10mg  once at bedtime, increase her water intake and she can take an over the counter CoQ10 to help with possible muscle aches/cramping.  I would like for her to follow up in 6 weeks to see how she is tolerating the medication.

## 2018-04-15 NOTE — Progress Notes (Signed)
The medication for cholesterol can cause muscle cramps.

## 2018-05-27 ENCOUNTER — Ambulatory Visit: Payer: Managed Care, Other (non HMO) | Admitting: Nurse Practitioner

## 2018-05-31 ENCOUNTER — Other Ambulatory Visit: Payer: Self-pay

## 2018-05-31 ENCOUNTER — Encounter: Payer: Self-pay | Admitting: Nurse Practitioner

## 2018-05-31 ENCOUNTER — Ambulatory Visit: Payer: Managed Care, Other (non HMO) | Admitting: Nurse Practitioner

## 2018-05-31 VITALS — BP 120/78 | HR 75 | Temp 98.3°F | Ht 60.2 in | Wt 171.0 lb

## 2018-05-31 DIAGNOSIS — Z7189 Other specified counseling: Secondary | ICD-10-CM

## 2018-05-31 DIAGNOSIS — E039 Hypothyroidism, unspecified: Secondary | ICD-10-CM | POA: Diagnosis not present

## 2018-05-31 DIAGNOSIS — E559 Vitamin D deficiency, unspecified: Secondary | ICD-10-CM

## 2018-05-31 DIAGNOSIS — I10 Essential (primary) hypertension: Secondary | ICD-10-CM | POA: Diagnosis not present

## 2018-05-31 DIAGNOSIS — E782 Mixed hyperlipidemia: Secondary | ICD-10-CM

## 2018-05-31 NOTE — Progress Notes (Signed)
Subjective:     Patient ID: Stephanie Garrison , female    DOB: 10/01/1965 , 53 y.o.   MRN: [id number]   Chief Complaint  Patient presents with  . Hypertension    HPI  Hyperlipidemia - doing well, tolerating medications without any muscle cramping.    She had an episode of elevated blood pressure last week diastolic was up to 89 after eating pork.    She is working in [employer] who is telling them they don't   Hypertension  This is a chronic problem. The current episode started more than 1 year ago. The problem has been gradually improving since onset. The problem is controlled. Pertinent negatives include no anxiety, headaches or shortness of breath. There are no associated agents to hypertension. There are no known risk factors for coronary artery disease. Past treatments include angiotensin blockers and diuretics. There are no compliance problems.  There is no history of angina. There is no history of chronic renal disease.     Past Medical History:  Diagnosis Date  . GERD (gastroesophageal reflux disease)    OTC  . Hypertension   . Hypothyroidism   . RBBB (right bundle branch block)    recurrent, asymptomatic  . S/P CABG (coronary artery bypass graft) 1964   when she was 53 years old  . S/P repair of tetralogy of Fallot    2D ECHO, 10/10/2009 - EF >55%, normal to mild  . Thyroid disease    hypothyroidism     Family History  Problem Relation Age of Onset  . Hyperlipidemia Mother      Current Outpatient Medications:  .  amLODipine (NORVASC) 5 MG tablet, Take 1 tablet (5 mg total) by mouth daily., Disp: 90 tablet, Rfl: 1 .  levothyroxine (SYNTHROID, LEVOTHROID) 50 MCG tablet, Take 1 tablet (50 mcg total) by mouth daily., Disp: 90 tablet, Rfl: 1 .  olmesartan-hydrochlorothiazide (BENICAR HCT) 40-25 MG tablet, Take 1 tablet by mouth daily., Disp: 90 tablet, Rfl: 1 .  pravastatin (PRAVACHOL) 10 MG tablet, Take 1 tablet (10 mg total) by mouth daily., Disp: 30  tablet, Rfl: 2 .  Vitamin D, Ergocalciferol, (DRISDOL) 1.25 MG (50000 UT) CAPS capsule, Take 1 capsule (50,000 Units total) by mouth every 7 (seven) days., Disp: 12 capsule, Rfl: 0   No Known Allergies   Review of Systems  Constitutional: Negative for fatigue.  Respiratory: Negative.  Negative for cough and shortness of breath.   Cardiovascular: Negative.   Endocrine: Negative for polydipsia, polyphagia and polyuria.  Musculoskeletal: Negative.   Skin: Negative.   Neurological: Negative for headaches.  Psychiatric/Behavioral: The patient is not nervous/anxious.      Today's Vitals   05/31/18 0841  BP: 120/78  Pulse: 75  Temp: 98.3 F (36.8 C)  TempSrc: Oral  SpO2: 97%  Weight: 171 lb (77.6 kg)  Height: 5' 0.2" (1.529 m)   Body mass index is 33.17 kg/m.   Objective:  Physical Exam Vitals signs reviewed.  Constitutional:      Appearance: Normal appearance. She is well-developed.  Eyes:     Pupils: Pupils are equal, round, and reactive to light.  Cardiovascular:     Rate and Rhythm: Normal rate and regular rhythm.     Heart sounds: Murmur (chronic) present.  Pulmonary:     Effort: Pulmonary effort is normal.     Breath sounds: Normal breath sounds.  Musculoskeletal: Normal range of motion.  Skin:    General: Skin is warm and dry.  Capillary Refill: Capillary refill takes less than 2 seconds.  Neurological:     General: No focal deficit present.     Mental Status: She is alert and oriented to person, place, and time.  Psychiatric:        Mood and Affect: Mood normal.        Behavior: Behavior normal.        Thought Content: Thought content normal.        Judgment: Judgment normal.         Assessment And Plan:     1. Essential hypertension . B/P is well controlled. . Encouraged to avoid high salt containing foods  . CMP ordered to check renal function.  . The importance of regular exercise and dietary modification was stressed to the patient.   . Stressed importance of losing ten percent of her body weight to help with B/P control.  . The weight loss would help with decreasing cardiac and cancer risk as well.  - CMP14 + Anion Gap  2. Acquired hypothyroidism  Doing well no labs for thyroid this visit  3. Mixed hyperlipidemia  Chronic, controlled  Continue with current medications - Lipid Profile  4. Vitamin D deficiency  Will check vitamin D level and supplement as needed.     Also encouraged to spend 15 minutes in the sun daily.  - Vitamin D (25 hydroxy)   [name], FNP   COVID-19 Education: The signs and symptoms of COVID-19 were discussed with the patient and how to seek care for testing (follow up with PCP or arrange E-visit).  The importance of social distancing was discussed today.   Patient Risk:   After full review of this patients clinical status, I feel that they are at least moderate risk at this time.   THE PATIENT IS ENCOURAGED TO PRACTICE SOCIAL DISTANCING DUE TO THE COVID-19 PANDEMIC.

## 2018-06-01 LAB — CMP14 + ANION GAP
ALT: 16 IU/L (ref 0–32)
AST: 24 IU/L (ref 0–40)
Albumin/Globulin Ratio: 1.5 (ref 1.2–2.2)
Albumin: 4.1 g/dL (ref 3.8–4.9)
Alkaline Phosphatase: 51 IU/L (ref 39–117)
Anion Gap: 16 mmol/L (ref 10.0–18.0)
BUN/Creatinine Ratio: 29 — ABNORMAL HIGH (ref 9–23)
BUN: 22 mg/dL (ref 6–24)
Bilirubin Total: 0.5 mg/dL (ref 0.0–1.2)
CO2: 23 mmol/L (ref 20–29)
Calcium: 9.3 mg/dL (ref 8.7–10.2)
Chloride: 98 mmol/L (ref 96–106)
Creatinine, Ser: 0.75 mg/dL (ref 0.57–1.00)
GFR calc Af Amer: 106 mL/min/{1.73_m2} (ref >59)
GFR calc non Af Amer: 92 mL/min/{1.73_m2} (ref >59)
Globulin, Total: 2.8 g/dL (ref 1.5–4.5)
Glucose: 91 mg/dL (ref 65–99)
Potassium: 3.2 mmol/L — ABNORMAL LOW (ref 3.5–5.2)
Sodium: 137 mmol/L (ref 134–144)
Total Protein: 6.9 g/dL (ref 6.0–8.5)

## 2018-06-01 LAB — LIPID PANEL
Chol/HDL Ratio: 2.4 ratio (ref 0.0–4.4)
Cholesterol, Total: 181 mg/dL (ref 100–199)
HDL: 74 mg/dL (ref >39)
LDL Calculated: 60 mg/dL (ref 0–99)
Triglycerides: 237 mg/dL — ABNORMAL HIGH (ref 0–149)
VLDL Cholesterol Cal: 47 mg/dL — ABNORMAL HIGH (ref 5–40)

## 2018-06-01 LAB — VITAMIN D 25 HYDROXY (VIT D DEFICIENCY, FRACTURES): Vit D, 25-Hydroxy: 39.1 ng/mL (ref 30.0–100.0)

## 2018-07-09 ENCOUNTER — Other Ambulatory Visit: Payer: Self-pay | Admitting: Nurse Practitioner

## 2018-07-13 ENCOUNTER — Telehealth: Payer: Self-pay | Admitting: *Deleted

## 2018-07-13 NOTE — Telephone Encounter (Signed)
A message was left.re:follow up visit. 

## 2018-07-22 ENCOUNTER — Ambulatory Visit: Payer: Managed Care, Other (non HMO) | Admitting: Nurse Practitioner

## 2018-07-26 ENCOUNTER — Other Ambulatory Visit: Payer: Self-pay | Admitting: Nurse Practitioner

## 2018-09-07 ENCOUNTER — Encounter: Payer: Self-pay | Admitting: Nurse Practitioner

## 2018-09-07 ENCOUNTER — Other Ambulatory Visit: Payer: Self-pay

## 2018-09-07 ENCOUNTER — Ambulatory Visit: Payer: Managed Care, Other (non HMO) | Admitting: Nurse Practitioner

## 2018-09-07 VITALS — BP 110/78 | HR 79 | Temp 98.2°F | Ht 60.8 in | Wt 174.8 lb

## 2018-09-07 DIAGNOSIS — E039 Hypothyroidism, unspecified: Secondary | ICD-10-CM | POA: Diagnosis not present

## 2018-09-07 DIAGNOSIS — E782 Mixed hyperlipidemia: Secondary | ICD-10-CM | POA: Diagnosis not present

## 2018-09-07 DIAGNOSIS — I10 Essential (primary) hypertension: Secondary | ICD-10-CM | POA: Diagnosis not present

## 2018-09-07 DIAGNOSIS — M25552 Pain in left hip: Secondary | ICD-10-CM | POA: Diagnosis not present

## 2018-09-07 DIAGNOSIS — E559 Vitamin D deficiency, unspecified: Secondary | ICD-10-CM

## 2018-09-07 MED ORDER — TRIAMCINOLONE ACETONIDE 40 MG/ML IJ SUSP
40.0000 mg | Freq: Once | INTRAMUSCULAR | Status: AC
  Administered 2018-09-07: 40 mg via INTRAMUSCULAR

## 2018-09-07 MED ORDER — KETOROLAC TROMETHAMINE 60 MG/2ML IM SOLN
60.0000 mg | Freq: Once | INTRAMUSCULAR | Status: AC
  Administered 2018-09-07: 60 mg via INTRAMUSCULAR

## 2018-09-07 MED ORDER — VITAMIN D (ERGOCALCIFEROL) 1.25 MG (50000 UNIT) PO CAPS: 50000.0000 [IU] | ORAL_CAPSULE | ORAL | 1 refills | Status: DC

## 2018-09-07 MED ORDER — DICLOFENAC SODIUM 1 % TD GEL: 2.0000 g | Freq: Four times a day (QID) | TRANSDERMAL | 1 refills | Status: DC

## 2018-09-07 MED ORDER — PRAVASTATIN SODIUM 10 MG PO TABS: 10.0000 mg | ORAL_TABLET | Freq: Every day | ORAL | 1 refills | Status: DC

## 2018-09-07 NOTE — Progress Notes (Signed)
Subjective:     Patient ID: Stephanie Garrison , female    DOB: 01-02-1966 , 53 y.o.   MRN: [id number]   Chief Complaint  Patient presents with  . Hypertension  . Hip Pain    patient states her left side it hurting and radiates down to her leg and it started about 2 months ago when she stopped herself from falling    HPI  Hypertension This is a chronic problem. The current episode started more than 1 year ago. The problem is unchanged. The problem is controlled. Pertinent negatives include no anxiety, chest pain, headaches, malaise/fatigue, palpitations or shortness of breath. There are no associated agents to hypertension. There are no known risk factors for coronary artery disease. Past treatments include angiotensin blockers and diuretics. There are no compliance problems.  There is no history of angina. There is no history of chronic renal disease.  Hip Pain  Incident onset: about 2 months ago she shifted her weight trying not to fall while at work and now has continued left hip pain. The injury mechanism was a twisting injury. The pain is present in the left hip (when she sits for long periods she has stiffness). The pain is at a severity of 6/10. The pain is mild. She reports no foreign bodies present. She has tried acetaminophen for the symptoms. The treatment provided mild relief.     Past Medical History:  Diagnosis Date  . GERD (gastroesophageal reflux disease)    OTC  . Hypertension   . Hypothyroidism   . RBBB (right bundle branch block)    recurrent, asymptomatic  . S/P CABG (coronary artery bypass graft) 1964   when she was 53 years old  . S/P repair of tetralogy of Fallot    2D ECHO, 10/10/2009 - EF >55%, normal to mild  . Thyroid disease    hypothyroidism     Family History  Problem Relation Age of Onset  . Hyperlipidemia Mother      Current Outpatient Medications:  .  amLODipine (NORVASC) 5 MG tablet, TAKE 1 TABLET DAILY, Disp: 90 tablet, Rfl: 3 .   levothyroxine (SYNTHROID) 50 MCG tablet, TAKE 1 TABLET DAILY, Disp: 90 tablet, Rfl: 3 .  olmesartan-hydrochlorothiazide (BENICAR HCT) 40-25 MG tablet, Take 1 tablet by mouth daily., Disp: 90 tablet, Rfl: 1 .  pravastatin (PRAVACHOL) 10 MG tablet, TAKE 1 TABLET BY MOUTH EVERY DAY, Disp: 30 tablet, Rfl: 2 .  Vitamin D, Ergocalciferol, (DRISDOL) 1.25 MG (50000 UT) CAPS capsule, TAKE 1 CAPSULE (50,000 UNITS TOTAL) BY MOUTH EVERY 7 (SEVEN) DAYS., Disp: 4 capsule, Rfl: 2   No Known Allergies   Review of Systems  Constitutional: Negative for fatigue and malaise/fatigue.  Respiratory: Negative.  Negative for cough and shortness of breath.   Cardiovascular: Negative.  Negative for chest pain, palpitations and leg swelling.  Endocrine: Negative for polydipsia, polyphagia and polyuria.  Musculoskeletal:       Left hip pain and stiffness limiting her from walking for exercise  Skin: Negative.   Neurological: Negative for dizziness and headaches.  Psychiatric/Behavioral: The patient is not nervous/anxious.      Today's Vitals   09/07/18 0846  BP: 110/78  Pulse: 79  Temp: 98.2 F (36.8 C)  TempSrc: Oral  Weight: 174 lb 12.8 oz (79.3 kg)  Height: 5' 0.8" (1.544 m)  PainSc: 6   PainLoc: Hip   Body mass index is 33.25 kg/m.   Objective:  Physical Exam Vitals signs reviewed.  Constitutional:  Appearance: Normal appearance. She is well-developed.  Eyes:     Pupils: Pupils are equal, round, and reactive to light.  Cardiovascular:     Rate and Rhythm: Normal rate and regular rhythm.     Pulses: Normal pulses.     Heart sounds: Murmur (chronic) present.  Pulmonary:     Effort: Pulmonary effort is normal. No respiratory distress.     Breath sounds: Normal breath sounds.  Musculoskeletal: Normal range of motion.        General: Tenderness (left hip posterior at gluteal area proximally on palpation) present.  Skin:    General: Skin is warm and dry.     Capillary Refill: Capillary  refill takes less than 2 seconds.  Neurological:     General: No focal deficit present.     Mental Status: She is alert and oriented to person, place, and time.  Psychiatric:        Mood and Affect: Mood normal.        Behavior: Behavior normal.        Thought Content: Thought content normal.        Judgment: Judgment normal.         Assessment And Plan:     1. Essential hypertension . B/P is excellent control . BMP ordered to check renal function.  . The importance of regular exercise and dietary modification was stressed to the patient.   - BMP8+eGFR  2. Mixed hyperlipidemia  Chronic, controlled  Continue with current medications - BMP8+eGFR - Lipid Profile  3. Acquired hypothyroidism  Chronic, controlled  Continue with current medications - TSH - T3 - T4, Free  4. Left hip pain  This sounds like sciatic nerve pain, will try kenalog and toradol injection anf provider her with diclofenac gel as needed  Negative straight leg raise - diclofenac sodium (VOLTAREN) 1 % GEL; Apply 2 g topically 4 (four) times daily.  Dispense: 100 g; Refill: 1 - ketorolac (TORADOL) injection 60 mg - triamcinolone acetonide (KENALOG-40) injection 40 mg  5. Vitamin D deficiency  Will check vitamin D level and supplement as needed.     Also encouraged to spend 15 minutes in the sun daily.  - Vitamin D (25 hydroxy)  [name], FNP   COVID-19 Education: The signs and symptoms of COVID-19 were discussed with the patient and how to seek care for testing (follow up with PCP or arrange E-visit).  The importance of social distancing was discussed today.   Patient Risk:   After full review of this patients clinical status, I feel that they are at least moderate risk at this time.   THE PATIENT IS ENCOURAGED TO PRACTICE SOCIAL DISTANCING DUE TO THE COVID-19 PANDEMIC.

## 2018-09-08 LAB — BMP8+EGFR
BUN/Creatinine Ratio: 20 (ref 9–23)
BUN: 16 mg/dL (ref 6–24)
CO2: 24 mmol/L (ref 20–29)
Calcium: 9.1 mg/dL (ref 8.7–10.2)
Chloride: 96 mmol/L (ref 96–106)
Creatinine, Ser: 0.79 mg/dL (ref 0.57–1.00)
GFR calc Af Amer: 100 mL/min/{1.73_m2} (ref >59)
GFR calc non Af Amer: 86 mL/min/{1.73_m2} (ref >59)
Glucose: 97 mg/dL (ref 65–99)
Potassium: 3.6 mmol/L (ref 3.5–5.2)
Sodium: 138 mmol/L (ref 134–144)

## 2018-09-08 LAB — LIPID PANEL
Chol/HDL Ratio: 2.6 ratio (ref 0.0–4.4)
Cholesterol, Total: 188 mg/dL (ref 100–199)
HDL: 73 mg/dL (ref >39)
LDL Calculated: 92 mg/dL (ref 0–99)
Triglycerides: 116 mg/dL (ref 0–149)
VLDL Cholesterol Cal: 23 mg/dL (ref 5–40)

## 2018-09-08 LAB — VITAMIN D 25 HYDROXY (VIT D DEFICIENCY, FRACTURES): Vit D, 25-Hydroxy: 40.8 ng/mL (ref 30.0–100.0)

## 2018-09-08 LAB — T3: T3, Total: 123 ng/dL (ref 71–180)

## 2018-09-08 LAB — TSH: TSH: 3.46 u[IU]/mL (ref 0.450–4.500)

## 2018-09-08 LAB — T4, FREE: Free T4: 1.41 ng/dL (ref 0.82–1.77)

## 2018-09-10 ENCOUNTER — Other Ambulatory Visit: Payer: Self-pay | Admitting: Nurse Practitioner

## 2018-10-04 ENCOUNTER — Other Ambulatory Visit: Payer: Self-pay | Admitting: Nurse Practitioner

## 2018-10-06 ENCOUNTER — Other Ambulatory Visit: Payer: Self-pay | Admitting: Nurse Practitioner

## 2018-10-12 ENCOUNTER — Ambulatory Visit (INDEPENDENT_AMBULATORY_CARE_PROVIDER_SITE_OTHER): Payer: Managed Care, Other (non HMO) | Admitting: Cardiovascular Disease

## 2018-10-12 ENCOUNTER — Encounter: Payer: Self-pay | Admitting: Cardiovascular Disease

## 2018-10-12 ENCOUNTER — Other Ambulatory Visit: Payer: Self-pay

## 2018-10-12 DIAGNOSIS — E782 Mixed hyperlipidemia: Secondary | ICD-10-CM | POA: Diagnosis not present

## 2018-10-12 DIAGNOSIS — I4510 Unspecified right bundle-branch block: Secondary | ICD-10-CM

## 2018-10-12 DIAGNOSIS — I10 Essential (primary) hypertension: Secondary | ICD-10-CM

## 2018-10-12 DIAGNOSIS — Q249 Congenital malformation of heart, unspecified: Secondary | ICD-10-CM

## 2018-10-12 NOTE — Progress Notes (Signed)
10/12/2018 Stephanie Garrison   [birth year]  [id number]  Primary Physician [name], [place] Primary Cardiologist: Lorretta Harp MD [facility], [place], [place]  HPI:  Stephanie Garrison is a 53 y.o.  mildly overweight married [place] American female, mother of 1 child, who I last saw  08/12/2017 has a history of repaired tetralogy of Fallot back in the 9s. Her other problems include hypertension, chronic right bundle branch block. She has been asymptomatic since I saw her last except for 1 episode of what sounds like PAF as a result of excessive alcohol intake. Her last echocardiogram performed, October 10, 2009, showed normal LV size and function with normal valvular function, and borderline dilated right ventricle with normal RV systolic function. Since I saw her a year ago she has remained entirely asymptomatic.A 2-D echo performed 11/20/15 revealed normal LV size and function, mild pulmonary hypertension with no evidence of VSD.  Since I saw her back a year ago she has done well.  She did have an uncomplicated right right rotator cuff repaired 09/20/2017 which she is completely recovered from.  She is otherwise asymptomatic denying chest pain or shortness of breath.  She is sheltering in place and socially distancing, wearing a mask otherwise.   Current Meds  Medication Sig  . amLODipine (NORVASC) 5 MG tablet TAKE 1 TABLET DAILY  . levothyroxine (SYNTHROID) 50 MCG tablet TAKE 1 TABLET DAILY  . olmesartan-hydrochlorothiazide (BENICAR HCT) 40-25 MG tablet TAKE 1 TABLET DAILY (NEED AN APPOINTMENT)  . pravastatin (PRAVACHOL) 10 MG tablet TAKE 1 TABLET BY MOUTH EVERY DAY  . Vitamin D, Ergocalciferol, (DRISDOL) 1.25 MG (50000 UT) CAPS capsule TAKE 1 CAPSULE (50,000 UNITS TOTAL) BY MOUTH EVERY 7 (SEVEN) DAYS.     No Known Allergies  Social History   Socioeconomic History  . Marital status: Married    Spouse name: Not on file  . Number of children: Not on file  . Years of education:  Not on file  . Highest education level: Not on file  Occupational History  . Not on file  Social Needs  . Financial resource strain: Not on file  . Food insecurity    Worry: Not on file    Inability: Not on file  . Transportation needs    Medical: Not on file    Non-medical: Not on file  Tobacco Use  . Smoking status: Never Smoker  . Smokeless tobacco: Never Used  Substance and Sexual Activity  . Alcohol use: Yes    Comment: 3 drinks tonight or more   . Drug use: No  . Sexual activity: Yes  Lifestyle  . Physical activity    Days per week: Not on file    Minutes per session: Not on file  . Stress: Not on file  Relationships  . Social Herbalist on phone: Not on file    Gets together: Not on file    Attends religious service: Not on file    Active member of club or organization: Not on file    Attends meetings of clubs or organizations: Not on file    Relationship status: Not on file  . Intimate partner violence    Fear of current or ex partner: Not on file    Emotionally abused: Not on file    Physically abused: Not on file    Forced sexual activity: Not on file  Other Topics Concern  . Not on file  Social History Narrative  .  Not on file     Review of Systems: General: negative for chills, fever, night sweats or weight changes.  Cardiovascular: negative for chest pain, dyspnea on exertion, edema, orthopnea, palpitations, paroxysmal nocturnal dyspnea or shortness of breath Dermatological: negative for rash Respiratory: negative for cough or wheezing Urologic: negative for hematuria Abdominal: negative for nausea, vomiting, diarrhea, bright red blood per rectum, melena, or hematemesis Neurologic: negative for visual changes, syncope, or dizziness All other systems reviewed and are otherwise negative except as noted above.    Blood pressure 108/74, pulse 84, temperature (!) 97 F (36.1 C), height 5\' 2"  (1.575 m), weight 167 lb (75.8 kg).  General  appearance: alert and no distress Neck: no adenopathy, no carotid bruit, no JVD, supple, symmetrical, trachea midline and thyroid not enlarged, symmetric, no tenderness/mass/nodules Lungs: clear to auscultation bilaterally Heart: regular rate and rhythm, S1, S2 normal, no murmur, click, rub or gallop Extremities: extremities normal, atraumatic, no cyanosis or edema Pulses: 2+ and symmetric Skin: Skin color, texture, turgor normal. No rashes or lesions Neurologic: Alert and oriented X 3, normal strength and tone. Normal symmetric reflexes. Normal coordination and gait  EKG sinus rhythm 84 with right bundle branch block.  I personally reviewed this EKG.  ASSESSMENT AND PLAN:   Right bundle branch block Chronic  Essential hypertension History of essential hypertension blood pressure measured at 108/74.  She is on amlodipine, olmesartan and hydrochlorothiazide.  Congenital heart disease History of repaired tetralogy of flow at age 11 at [facility].  Her last echo performed 11/20/2015 revealing normal LV size and function with mild pulmonary hypertension and no evidence of VSD.  Mixed hyperlipidemia History of hyperlipidemia on pravastatin with lipid profile performed 09/07/2018 revealing total cholesterol 188, LDL of 60 and HDL of 74.      Lorretta Harp MD FACP,FACC,FAHA, [facility] 10/12/2018 11:43 AM

## 2018-10-12 NOTE — Assessment & Plan Note (Signed)
Chronic. 

## 2018-10-12 NOTE — Assessment & Plan Note (Signed)
History of hyperlipidemia on pravastatin with lipid profile performed 09/07/2018 revealing total cholesterol 188, LDL of 60 and HDL of 74.

## 2018-10-12 NOTE — Assessment & Plan Note (Signed)
History of essential hypertension blood pressure measured at 108/74.  She is on amlodipine, olmesartan and hydrochlorothiazide.

## 2018-10-12 NOTE — Assessment & Plan Note (Signed)
History of repaired tetralogy of flow at age 53 at [facility].  Her last echo performed 11/20/2015 revealing normal LV size and function with mild pulmonary hypertension and no evidence of VSD.

## 2018-10-12 NOTE — Patient Instructions (Signed)

## 2018-10-14 ENCOUNTER — Other Ambulatory Visit (INDEPENDENT_AMBULATORY_CARE_PROVIDER_SITE_OTHER): Payer: Managed Care, Other (non HMO)

## 2018-10-14 DIAGNOSIS — I10 Essential (primary) hypertension: Secondary | ICD-10-CM | POA: Diagnosis not present

## 2018-10-24 DIAGNOSIS — E079 Disorder of thyroid, unspecified: Secondary | ICD-10-CM | POA: Insufficient documentation

## 2018-10-25 LAB — HM MAMMOGRAPHY

## 2018-12-12 ENCOUNTER — Encounter: Payer: Self-pay | Admitting: Nurse Practitioner

## 2018-12-12 ENCOUNTER — Ambulatory Visit: Payer: Managed Care, Other (non HMO) | Admitting: Nurse Practitioner

## 2018-12-12 ENCOUNTER — Other Ambulatory Visit: Payer: Self-pay

## 2018-12-12 VITALS — BP 116/78 | HR 77 | Temp 98.6°F | Ht 62.0 in | Wt 170.8 lb

## 2018-12-12 DIAGNOSIS — E782 Mixed hyperlipidemia: Secondary | ICD-10-CM

## 2018-12-12 DIAGNOSIS — M25552 Pain in left hip: Secondary | ICD-10-CM | POA: Diagnosis not present

## 2018-12-12 DIAGNOSIS — Q249 Congenital malformation of heart, unspecified: Secondary | ICD-10-CM

## 2018-12-12 DIAGNOSIS — E039 Hypothyroidism, unspecified: Secondary | ICD-10-CM | POA: Diagnosis not present

## 2018-12-12 DIAGNOSIS — I10 Essential (primary) hypertension: Secondary | ICD-10-CM

## 2018-12-12 DIAGNOSIS — E559 Vitamin D deficiency, unspecified: Secondary | ICD-10-CM

## 2018-12-12 MED ORDER — DICLOFENAC SODIUM 1 % TD GEL: 2.0000 g | Freq: Four times a day (QID) | TRANSDERMAL | 2 refills | Status: DC

## 2018-12-12 NOTE — Progress Notes (Signed)
Subjective:     Patient ID: Stephanie Garrison , female    DOB: 1965/11/09 , 53 y.o.   MRN: [id number]   Chief Complaint  Patient presents with  . Hypertension    HPI  Wt Readings from Last 3 Encounters: 12/12/18 : 170 lb 12.8 oz (77.5 kg) 10/12/18 : 167 lb (75.8 kg) 09/07/18 : 174 lb 12.8 oz (79.3 kg)   Hypertension This is a chronic problem. The current episode started more than 1 year ago. The problem is unchanged. The problem is controlled. Pertinent negatives include no anxiety, chest pain, headaches, malaise/fatigue, palpitations or shortness of breath. There are no associated agents to hypertension. There are no known risk factors for coronary artery disease. Past treatments include angiotensin blockers and diuretics. Compliance problems include exercise.  There is no history of angina or kidney disease. There is no history of chronic renal disease.     Past Medical History:  Diagnosis Date  . GERD (gastroesophageal reflux disease)    OTC  . Hypertension   . Hypothyroidism   . RBBB (right bundle branch block)    recurrent, asymptomatic  . S/P CABG (coronary artery bypass graft) 1964   when she was 53 years old  . S/P repair of tetralogy of Fallot    2D ECHO, 10/10/2009 - EF >55%, normal to mild  . Thyroid disease    hypothyroidism     Family History  Problem Relation Age of Onset  . Hyperlipidemia Mother      Current Outpatient Medications:  .  amLODipine (NORVASC) 5 MG tablet, TAKE 1 TABLET DAILY, Disp: 90 tablet, Rfl: 3 .  levothyroxine (SYNTHROID) 50 MCG tablet, TAKE 1 TABLET DAILY, Disp: 90 tablet, Rfl: 3 .  olmesartan-hydrochlorothiazide (BENICAR HCT) 40-25 MG tablet, TAKE 1 TABLET DAILY (NEED AN APPOINTMENT), Disp: 90 tablet, Rfl: 1 .  pravastatin (PRAVACHOL) 10 MG tablet, TAKE 1 TABLET BY MOUTH EVERY DAY, Disp: 90 tablet, Rfl: 1 .  Vitamin D, Ergocalciferol, (DRISDOL) 1.25 MG (50000 UT) CAPS capsule, TAKE 1 CAPSULE (50,000 UNITS TOTAL) BY MOUTH EVERY 7  (SEVEN) DAYS., Disp: 4 capsule, Rfl: 2   No Known Allergies   Review of Systems  Constitutional: Negative for fatigue and malaise/fatigue.  Respiratory: Negative.  Negative for cough and shortness of breath.   Cardiovascular: Negative.  Negative for chest pain, palpitations and leg swelling.  Endocrine: Negative for polydipsia, polyphagia and polyuria.  Musculoskeletal:       Left hip pain and stiffness limiting her from walking for exercise  Skin: Negative.   Neurological: Negative for dizziness and headaches.  Psychiatric/Behavioral: Negative.  The patient is not nervous/anxious.      Today's Vitals   12/12/18 0932  BP: 116/78  Pulse: 77  Temp: 98.6 F (37 C)  TempSrc: Oral  Weight: 170 lb 12.8 oz (77.5 kg)  Height: 5\' 2"  (1.575 m)   Body mass index is 31.24 kg/m.   Objective:  Physical Exam Vitals signs reviewed.  Constitutional:      Appearance: Normal appearance. She is well-developed.  Eyes:     Pupils: Pupils are equal, round, and reactive to light.  Cardiovascular:     Rate and Rhythm: Normal rate and regular rhythm.     Pulses: Normal pulses.     Heart sounds: Murmur (chronic) present.  Pulmonary:     Effort: Pulmonary effort is normal. No respiratory distress.     Breath sounds: Normal breath sounds.  Musculoskeletal: Normal range of motion.  General: No swelling or tenderness.     Right lower leg: No edema.     Left lower leg: No edema.  Skin:    General: Skin is warm and dry.     Capillary Refill: Capillary refill takes less than 2 seconds.  Neurological:     General: No focal deficit present.     Mental Status: She is alert and oriented to person, place, and time.  Psychiatric:        Mood and Affect: Mood normal.        Behavior: Behavior normal.        Thought Content: Thought content normal.        Judgment: Judgment normal.         Assessment And Plan:     1. Essential hypertension  Chronic, well controlled  Continue with  current medications  Encouraged to continue with walking as tolerated  2. Mixed hyperlipidemia  Chronic, controlled  Continue current medications  Normal lipid panel at last visit will check in 3 months with physical  3. Acquired hypothyroidism  Chronic, controlled  Continue with current medications  4. Vitamin D deficiency  Will check vitamin D level and supplement as needed.     Also encouraged to spend 15 minutes in the sun daily.   5. Congenital heart disease  She is being followed yearly by Cardiology  6. Left hip pain  Intermittent left hip pain which is keeping her from walking regularly  Will try diclofenac gel, if not better return to office she may need an [place], [place]   COVID-19 Education: The signs and symptoms of COVID-19 were discussed with the patient and how to seek care for testing (follow up with PCP or arrange E-visit).  The importance of social distancing was discussed today.   Patient Risk:   After full review of this patients clinical status, I feel that they are at least moderate risk at this time.   THE PATIENT IS ENCOURAGED TO PRACTICE SOCIAL DISTANCING DUE TO THE COVID-19 PANDEMIC.

## 2018-12-12 NOTE — Patient Instructions (Signed)
Hip Exercises Ask your health care provider which exercises are safe for you. Do exercises exactly as told by your health care provider and adjust them as directed. It is normal to feel mild stretching, pulling, tightness, or discomfort as you do these exercises. Stop right away if you feel sudden pain or your pain gets worse. Do not begin these exercises until told by your health care provider. Stretching and range-of-motion exercises These exercises warm up your muscles and joints and improve the movement and flexibility of your hip. These exercises also help to relieve pain, numbness, and tingling. You may be asked to limit your range of motion if you had a hip replacement. Talk to your health care provider about these restrictions. Hamstrings, supine  1. Lie on your back (supine position). 2. Loop a belt or towel over the ball of your left / right foot. The ball of your foot is on the walking surface, right under your toes. 3. Straighten your left / right knee and slowly pull on the belt or towel to raise your leg until you feel a gentle stretch behind your knee (hamstring). ? Do not let your knee bend while you do this. ? Keep your other leg flat on the floor. 4. Hold this position for __________ seconds. 5. Slowly return your leg to the starting position. Repeat __________ times. Complete this exercise __________ times a day. Hip rotation  1. Lie on your back on a firm surface. 2. With your left / right hand, gently pull your left / right knee toward the shoulder that is on the same side of the body. Stop when your knee is pointing toward the ceiling. 3. Hold your left / right ankle with your other hand. 4. Keeping your knee steady, gently pull your left / right ankle toward your other shoulder until you feel a stretch in your buttocks. ? Keep your hips and shoulders firmly planted while you do this stretch. 5. Hold this position for __________ seconds. Repeat __________ times. Complete  this exercise __________ times a day. Seated stretch This exercise is sometimes called hamstrings and adductors stretch. 1. Sit on the floor with your legs stretched wide. Keep your knees straight during this exercise. 2. Keeping your head and back in a straight line, bend at your waist to reach for your left foot (position A). You should feel a stretch in your right inner thigh (adductors). 3. Hold this position for __________ seconds. Then slowly return to the upright position. 4. Keeping your head and back in a straight line, bend at your waist to reach forward (position B). You should feel a stretch behind both of your thighs and knees (hamstrings). 5. Hold this position for __________ seconds. Then slowly return to the upright position. 6. Keeping your head and back in a straight line, bend at your waist to reach for your right foot (position C). You should feel a stretch in your left inner thigh (adductors). 7. Hold this position for __________ seconds. Then slowly return to the upright position. Repeat __________ times. Complete this exercise __________ times a day. Lunge This exercise stretches the muscles of the hip (hip flexors). 1. Place your left / right knee on the floor and bend your other knee so that is directly over your ankle. You should be half-kneeling. 2. Keep good posture with your head over your shoulders. 3. Tighten your buttocks to point your tailbone downward. This will prevent your back from arching too much. 4. You should feel a  gentle stretch in the front of your left / right thigh and hip. If you do not feel a stretch, slide your other foot forward slightly and then slowly lunge forward with your chest up until your knee once again lines up over your ankle. ? Make sure your tailbone continues to point downward. 5. Hold this position for __________ seconds. 6. Slowly return to the starting position. Repeat __________ times. Complete this exercise __________ times a  day. Strengthening exercises These exercises build strength and endurance in your hip. Endurance is the ability to use your muscles for a long time, even after they get tired. Bridge This exercise strengthens the muscles of your hip (hip extensors). 1. Lie on your back on a firm surface with your knees bent and your feet flat on the floor. 2. Tighten your buttocks muscles and lift your bottom off the floor until the trunk of your body and your hips are level with your thighs. ? Do not arch your back. ? You should feel the muscles working in your buttocks and the back of your thighs. If you do not feel these muscles, slide your feet 1-2 inches (2.5-5 cm) farther away from your buttocks. 3. Hold this position for __________ seconds. 4. Slowly lower your hips to the starting position. 5. Let your muscles relax completely between repetitions. Repeat __________ times. Complete this exercise __________ times a day. Straight leg raises, side-lying This exercise strengthens the muscles that move the hip joint away from the center of the body (hip abductors). 1. Lie on your side with your left / right leg in the top position. Lie so your head, shoulder, hip, and knee line up. You may bend your bottom knee slightly to help you balance. 2. Roll your hips slightly forward, so your hips are stacked directly over each other and your left / right knee is facing forward. 3. Leading with your heel, lift your top leg 4-6 inches (10-15 cm). You should feel the muscles in your top hip lifting. ? Do not let your foot drift forward. ? Do not let your knee roll toward the ceiling. 4. Hold this position for __________ seconds. 5. Slowly return to the starting position. 6. Let your muscles relax completely between repetitions. Repeat __________ times. Complete this exercise __________ times a day. Straight leg raises, side-lying This exercise strengthen the muscles that move the hip joint toward the center of the  body (hip adductors). 1. Lie on your side with your left / right leg in the bottom position. Lie so your head, shoulder, hip, and knee line up. You may place your upper foot in front to help you balance. 2. Roll your hips slightly forward, so your hips are stacked directly over each other and your left / right knee is facing forward. 3. Tense the muscles in your inner thigh and lift your bottom leg 4-6 inches (10-15 cm). 4. Hold this position for __________ seconds. 5. Slowly return to the starting position. 6. Let your muscles relax completely between repetitions. Repeat __________ times. Complete this exercise __________ times a day. Straight leg raises, supine This exercise strengthens the muscles in the front of your thigh (quadriceps). 1. Lie on your back (supine position) with your left / right leg extended and your other knee bent. 2. Tense the muscles in the front of your left / right thigh. You should see your kneecap slide up or see increased dimpling just above your knee. 3. Keep these muscles tight as you raise your   leg 4-6 inches (10-15 cm) off the floor. Do not let your knee bend. 4. Hold this position for __________ seconds. 5. Keep these muscles tense as you lower your leg. 6. Relax the muscles slowly and completely between repetitions. Repeat __________ times. Complete this exercise __________ times a day. Hip abductors, standing This exercise strengthens the muscles that move the leg and hip joint away from the center of the body (hip abductors). 1. Tie one end of a rubber exercise band or tubing to a secure surface, such as a chair, table, or pole. 2. Loop the other end of the band or tubing around your left / right ankle. 3. Keeping your ankle with the band or tubing directly opposite the secured end, step away until there is tension in the tubing or band. Hold on to a chair, table, or pole as needed for balance. 4. Lift your left / right leg out to your side. While you do  this: ? Keep your back upright. ? Keep your shoulders over your hips. ? Keep your toes pointing forward. ? Make sure to use your hip muscles to slowly lift your leg. Do not tip your body or forcefully lift your leg. 5. Hold this position for __________ seconds. 6. Slowly return to the starting position. Repeat __________ times. Complete this exercise __________ times a day. Squats This exercise strengthens the muscles in the front of your thigh (quadriceps). 1. Stand in a door frame so your feet and knees are in line with the frame. You may place your hands on the frame for balance. 2. Slowly bend your knees and lower your hips like you are going to sit in a chair. ? Keep your lower legs in a straight-up-and-down position. ? Do not let your hips go lower than your knees. ? Do not bend your knees lower than told by your health care provider. ? If your hip pain increases, do not bend as low. 3. Hold this position for ___________ seconds. 4. Slowly push with your legs to return to standing. Do not use your hands to pull yourself to standing. Repeat __________ times. Complete this exercise __________ times a day. This information is not intended to replace advice given to you by your health care provider. Make sure you discuss any questions you have with your health care provider. Document Released: 02/27/2005 Document Revised: 12/21/2017 Document Reviewed: 12/21/2017 Elsevier Patient Education  2020 [place] or Strain Rehab Ask your health care provider which exercises are safe for you. Do exercises exactly as told by your health care provider and adjust them as directed. It is normal to feel mild stretching, pulling, tightness, or discomfort as you do these exercises. Stop right away if you feel sudden pain or your pain gets worse. Do not begin these exercises until told by your health care provider. Stretching and range-of-motion exercises These exercises warm up your  muscles and joints and improve the movement and flexibility of your back. These exercises also help to relieve pain, numbness, and tingling. Lumbar rotation  1. Lie on your back on a firm surface and bend your knees. 2. Straighten your arms out to your sides so each arm forms a 90-degree angle (right angle) with a side of your body. 3. Slowly move (rotate) both of your knees to one side of your body until you feel a stretch in your lower back (lumbar). Try not to let your shoulders lift off the floor. 4. Hold this position for __________ seconds.  5. Tense your abdominal muscles and slowly move your knees back to the starting position. 6. Repeat this exercise on the other side of your body. Repeat __________ times. Complete this exercise __________ times a day. Single knee to chest  6. Lie on your back on a firm surface with both legs straight. 7. Bend one of your knees. Use your hands to move your knee up toward your chest until you feel a gentle stretch in your lower back and buttock. ? Hold your leg in this position by holding on to the front of your knee. ? Keep your other leg as straight as possible. 8. Hold this position for __________ seconds. 9. Slowly return to the starting position. 10. Repeat with your other leg. Repeat __________ times. Complete this exercise __________ times a day. Prone extension on elbows  1. Lie on your abdomen on a firm surface (prone position). 2. Prop yourself up on your elbows. 3. Use your arms to help lift your chest up until you feel a gentle stretch in your abdomen and your lower back. ? This will place some of your body weight on your elbows. If this is uncomfortable, try stacking pillows under your chest. ? Your hips should stay down, against the surface that you are lying on. Keep your hip and back muscles relaxed. 4. Hold this position for __________ seconds. 5. Slowly relax your upper body and return to the starting position. Repeat __________  times. Complete this exercise __________ times a day. Strengthening exercises These exercises build strength and endurance in your back. Endurance is the ability to use your muscles for a long time, even after they get tired. Pelvic tilt This exercise strengthens the muscles that lie deep in the abdomen. 7. Lie on your back on a firm surface. Bend your knees and keep your feet flat on the floor. 8. Tense your abdominal muscles. Tip your pelvis up toward the ceiling and flatten your lower back into the floor. ? To help with this exercise, you may place a small towel under your lower back and try to push your back into the towel. 9. Hold this position for __________ seconds. 10. Let your muscles relax completely before you repeat this exercise. Repeat __________ times. Complete this exercise __________ times a day. Alternating arm and leg raises  6. Get on your hands and knees on a firm surface. If you are on a hard floor, you may want to use padding, such as an exercise mat, to cushion your knees. 7. Line up your arms and legs. Your hands should be directly below your shoulders, and your knees should be directly below your hips. 8. Lift your left leg behind you. At the same time, raise your right arm and straighten it in front of you. ? Do not lift your leg higher than your hip. ? Do not lift your arm higher than your shoulder. ? Keep your abdominal and back muscles tight. ? Keep your hips facing the ground. ? Do not arch your back. ? Keep your balance carefully, and do not hold your breath. 9. Hold this position for __________ seconds. 10. Slowly return to the starting position. 11. Repeat with your right leg and your left arm. Repeat __________ times. Complete this exercise __________ times a day. Abdominal set with straight leg raise  7. Lie on your back on a firm surface. 8. Bend one of your knees and keep your other leg straight. 9. Tense your abdominal muscles and lift your  straight  leg up, 4-6 inches (10-15 cm) off the ground. 10. Keep your abdominal muscles tight and hold this position for __________ seconds. ? Do not hold your breath. ? Do not arch your back. Keep it flat against the ground. 11. Keep your abdominal muscles tense as you slowly lower your leg back to the starting position. 12. Repeat with your other leg. Repeat __________ times. Complete this exercise __________ times a day. Single leg lower with bent knees 1. Lie on your back on a firm surface. 2. Tense your abdominal muscles and lift your feet off the floor, one foot at a time, so your knees and hips are bent in 90-degree angles (right angles). ? Your knees should be over your hips and your lower legs should be parallel to the floor. 3. Keeping your abdominal muscles tense and your knee bent, slowly lower one of your legs so your toe touches the ground. 4. Lift your leg back up to return to the starting position. ? Do not hold your breath. ? Do not let your back arch. Keep your back flat against the ground. 5. Repeat with your other leg. Repeat __________ times. Complete this exercise __________ times a day. Posture and body mechanics Good posture and healthy body mechanics can help to relieve stress in your body's tissues and joints. Body mechanics refers to the movements and positions of your body while you do your daily activities. Posture is part of body mechanics. Good posture means:  Your spine is in its natural S-curve position (neutral).  Your shoulders are pulled back slightly.  Your head is not tipped forward. Follow these guidelines to improve your posture and body mechanics in your everyday activities. Standing   When standing, keep your spine neutral and your feet about hip width apart. Keep a slight bend in your knees. Your ears, shoulders, and hips should line up.  When you do a task in which you stand in one place for a long time, place one foot up on a stable object  that is 2-4 inches (5-10 cm) high, such as a footstool. This helps keep your spine neutral. Sitting   When sitting, keep your spine neutral and keep your feet flat on the floor. Use a footrest, if necessary, and keep your thighs parallel to the floor. Avoid rounding your shoulders, and avoid tilting your head forward.  When working at a desk or a computer, keep your desk at a height where your hands are slightly lower than your elbows. Slide your chair under your desk so you are close enough to maintain good posture.  When working at a computer, place your monitor at a height where you are looking straight ahead and you do not have to tilt your head forward or downward to look at the screen. Resting  When lying down and resting, avoid positions that are most painful for you.  If you have pain with activities such as sitting, bending, stooping, or squatting, lie in a position in which your body does not bend very much. For example, avoid curling up on your side with your arms and knees near your chest (fetal position).  If you have pain with activities such as standing for a long time or reaching with your arms, lie with your spine in a neutral position and bend your knees slightly. Try the following positions: ? Lying on your side with a pillow between your knees. ? Lying on your back with a pillow under your knees. Lifting   When  lifting objects, keep your feet at least shoulder width apart and tighten your abdominal muscles.  Bend your knees and hips and keep your spine neutral. It is important to lift using the strength of your legs, not your back. Do not lock your knees straight out.  Always ask for help to lift heavy or awkward objects. This information is not intended to replace advice given to you by your health care provider. Make sure you discuss any questions you have with your health care provider. Document Released: 02/09/2005 Document Revised: 06/03/2018 Document Reviewed:  03/03/2018 Elsevier Patient Education  2020 [employer].

## 2019-03-11 ENCOUNTER — Other Ambulatory Visit: Payer: Self-pay | Admitting: Nurse Practitioner

## 2019-03-28 ENCOUNTER — Ambulatory Visit: Payer: Managed Care, Other (non HMO) | Admitting: Nurse Practitioner

## 2019-03-28 ENCOUNTER — Encounter: Payer: Self-pay | Admitting: Nurse Practitioner

## 2019-03-28 ENCOUNTER — Other Ambulatory Visit: Payer: Self-pay

## 2019-03-28 VITALS — BP 116/84 | HR 82 | Temp 98.2°F | Ht 61.2 in | Wt 172.8 lb

## 2019-03-28 DIAGNOSIS — H9201 Otalgia, right ear: Secondary | ICD-10-CM

## 2019-03-28 DIAGNOSIS — Z0000 Encounter for general adult medical examination without abnormal findings: Secondary | ICD-10-CM

## 2019-03-28 DIAGNOSIS — M25551 Pain in right hip: Secondary | ICD-10-CM | POA: Diagnosis not present

## 2019-03-28 DIAGNOSIS — R232 Flushing: Secondary | ICD-10-CM

## 2019-03-28 DIAGNOSIS — E039 Hypothyroidism, unspecified: Secondary | ICD-10-CM | POA: Diagnosis not present

## 2019-03-28 DIAGNOSIS — I10 Essential (primary) hypertension: Secondary | ICD-10-CM

## 2019-03-28 DIAGNOSIS — E559 Vitamin D deficiency, unspecified: Secondary | ICD-10-CM

## 2019-03-28 DIAGNOSIS — E782 Mixed hyperlipidemia: Secondary | ICD-10-CM

## 2019-03-28 DIAGNOSIS — R7309 Other abnormal glucose: Secondary | ICD-10-CM

## 2019-03-28 LAB — POCT URINALYSIS DIPSTICK
Bilirubin, UA: NEGATIVE
Glucose, UA: NEGATIVE
Ketones, UA: NEGATIVE
Leukocytes, UA: NEGATIVE
Nitrite, UA: NEGATIVE
Protein, UA: NEGATIVE
Spec Grav, UA: >=1.03 — AB (ref 1.010–1.025)
Urobilinogen, UA: 0.2 E.U./dL
pH, UA: 5.5 (ref 5.0–8.0)

## 2019-03-28 LAB — POCT UA - MICROALBUMIN
Creatinine, POC: 300 mg/dL
Microalbumin Ur, POC: 80 mg/L

## 2019-03-28 MED ORDER — PREDNISONE 10 MG (21) PO TBPK: ORAL_TABLET | ORAL | 0 refills | Status: DC

## 2019-03-28 NOTE — Patient Instructions (Signed)
Health Maintenance  Topic Date Due  . MAMMOGRAM  12/05/2015  . PAP SMEAR-Modifier  04/04/2017  . INFLUENZA VACCINE  05/24/2019 (Originally 09/24/2018)  . TETANUS/TDAP  04/21/2020  . COLONOSCOPY  09/23/2026  . HIV Screening  Completed    Perimenopause  Perimenopause is the normal time of life before and after menstrual periods stop completely (menopause). Perimenopause can begin 2-8 years before menopause, and it usually lasts for 1 year after menopause. During perimenopause, the ovaries may or may not produce an egg. What are the causes? This condition is caused by a natural change in hormone levels that happens as you get older. What increases the risk? This condition is more likely to start at an earlier age if you have certain medical conditions or treatments, including:  A tumor of the pituitary gland in the brain.  A disease that affects the ovaries and hormone production.  Radiation treatment for cancer.  Certain cancer treatments, such as chemotherapy or hormone (anti-estrogen) therapy.  Heavy smoking and excessive alcohol use.  Family history of early menopause. What are the signs or symptoms? Perimenopausal changes affect each woman differently. Symptoms of this condition may include:  Hot flashes.  Night sweats.  Irregular menstrual periods.  Decreased sex drive.  Vaginal dryness.  Headaches.  Mood swings.  Depression.  Memory problems or trouble concentrating.  Irritability.  Tiredness.  Weight gain.  Anxiety.  Trouble getting pregnant. How is this diagnosed? This condition is diagnosed based on your medical history, a physical exam, your age, your menstrual history, and your symptoms. Hormone tests may also be done. How is this treated? In some cases, no treatment is needed. You and your health care provider should make a decision together about whether treatment is necessary. Treatment will be based on your individual condition and preferences.  Various treatments are available, such as:  Menopausal hormone therapy (MHT).  Medicines to treat specific symptoms.  Acupuncture.  Vitamin or herbal supplements. Before starting treatment, make sure to let your health care provider know if you have a personal or family history of:  Heart disease.  Breast cancer.  Blood clots.  Diabetes.  Osteoporosis. Follow these instructions at home: Lifestyle  Do not use any products that contain nicotine or tobacco, such as cigarettes and e-cigarettes. If you need help quitting, ask your health care provider.  Eat a balanced diet that includes fresh fruits and vegetables, whole grains, soybeans, eggs, lean meat, and low-fat dairy.  Get at least 30 minutes of physical activity on 5 or more days each week.  Avoid alcoholic and caffeinated beverages, as well as spicy foods. This may help prevent hot flashes.  Get 7-8 hours of sleep each night.  Dress in layers that can be removed to help you manage hot flashes.  Find ways to manage stress, such as deep breathing, meditation, or journaling. General instructions  Keep track of your menstrual periods, including: ? When they occur. ? How heavy they are and how long they last. ? How much time passes between periods.  Keep track of your symptoms, noting when they start, how often you have them, and how long they last.  Take over-the-counter and prescription medicines only as told by your health care provider.  Take vitamin supplements only as told by your health care provider. These may include calcium, vitamin E, and vitamin D.  Use vaginal lubricants or moisturizers to help with vaginal dryness and improve comfort during sex.  Talk with your health care provider before starting  any herbal supplements.  Keep all follow-up visits as told by your health care provider. This is important. This includes any group therapy or counseling. Contact a health care provider if:  You have heavy  vaginal bleeding or pass blood clots.  Your period lasts more than 2 days longer than normal.  Your periods are recurring sooner than 21 days.  You bleed after having sex. Get help right away if:  You have chest pain, trouble breathing, or trouble talking.  You have severe depression.  You have pain when you urinate.  You have severe headaches.  You have vision problems. Summary  Perimenopause is the time when a woman's body begins to move into menopause. This may happen naturally or as a result of other health problems or medical treatments.  Perimenopause can begin 2-8 years before menopause, and it usually lasts for 1 year after menopause.  Perimenopausal symptoms can be managed through medicines, lifestyle changes, and complementary therapies such as acupuncture. This information is not intended to replace advice given to you by your health care provider. Make sure you discuss any questions you have with your health care provider. Document Revised: 01/22/2017 Document Reviewed: 03/17/2016 Elsevier Patient Education  2020 [place] Maintenance, Female Adopting a healthy lifestyle and getting preventive care are important in promoting health and wellness. Ask your health care provider about:  The right schedule for you to have regular tests and exams.  Things you can do on your own to prevent diseases and keep yourself healthy. What should I know about diet, weight, and exercise? Eat a healthy diet   Eat a diet that includes plenty of vegetables, fruits, low-fat dairy products, and lean protein.  Do not eat a lot of foods that are high in solid fats, added sugars, or sodium. Maintain a healthy weight Body mass index (BMI) is used to identify weight problems. It estimates body fat based on height and weight. Your health care provider can help determine your BMI and help you achieve or maintain a healthy weight. Get regular exercise Get regular exercise. This  is one of the most important things you can do for your health. Most adults should:  Exercise for at least 150 minutes each week. The exercise should increase your heart rate and make you sweat (moderate-intensity exercise).  Do strengthening exercises at least twice a week. This is in addition to the moderate-intensity exercise.  Spend less time sitting. Even light physical activity can be beneficial. Watch cholesterol and blood lipids Have your blood tested for lipids and cholesterol at 54 years of age, then have this test every 5 years. Have your cholesterol levels checked more often if:  Your lipid or cholesterol levels are high.  You are older than 54 years of age.  You are at high risk for heart disease. What should I know about cancer screening? Depending on your health history and family history, you may need to have cancer screening at various ages. This may include screening for:  Breast cancer.  Cervical cancer.  Colorectal cancer.  Skin cancer.  Lung cancer. What should I know about heart disease, diabetes, and high blood pressure? Blood pressure and heart disease  High blood pressure causes heart disease and increases the risk of stroke. This is more likely to develop in people who have high blood pressure readings, are of African descent, or are overweight.  Have your blood pressure checked: ? Every 3-5 years if you are 47-42 years of age. ? Every year  if you are 64 years old or older. Diabetes Have regular diabetes screenings. This checks your fasting blood sugar level. Have the screening done:  Once every three years after age 22 if you are at a normal weight and have a low risk for diabetes.  More often and at a younger age if you are overweight or have a high risk for diabetes. What should I know about preventing infection? Hepatitis B If you have a higher risk for hepatitis B, you should be screened for this virus. Talk with your health care provider to  find out if you are at risk for hepatitis B infection. Hepatitis C Testing is recommended for:  Everyone born from [birth year] through 1965.  Anyone with known risk factors for hepatitis C. Sexually transmitted infections (STIs)  Get screened for STIs, including gonorrhea and chlamydia, if: ? You are sexually active and are younger than 54 years of age. ? You are older than 54 years of age and your health care provider tells you that you are at risk for this type of infection. ? Your sexual activity has changed since you were last screened, and you are at increased risk for chlamydia or gonorrhea. Ask your health care provider if you are at risk.  Ask your health care provider about whether you are at high risk for HIV. Your health care provider may recommend a prescription medicine to help prevent HIV infection. If you choose to take medicine to prevent HIV, you should first get tested for HIV. You should then be tested every 3 months for as long as you are taking the medicine. Pregnancy  If you are about to stop having your period (premenopausal) and you may become pregnant, seek counseling before you get pregnant.  Take 400 to 800 micrograms (mcg) of folic acid every day if you become pregnant.  Ask for birth control (contraception) if you want to prevent pregnancy. Osteoporosis and menopause Osteoporosis is a disease in which the bones lose minerals and strength with aging. This can result in bone fractures. If you are 57 years old or older, or if you are at risk for osteoporosis and fractures, ask your health care provider if you should:  Be screened for bone loss.  Take a calcium or vitamin D supplement to lower your risk of fractures.  Be given hormone replacement therapy (HRT) to treat symptoms of menopause. Follow these instructions at home: Lifestyle  Do not use any products that contain nicotine or tobacco, such as cigarettes, e-cigarettes, and chewing tobacco. If you need help  quitting, ask your health care provider.  Do not use street drugs.  Do not share needles.  Ask your health care provider for help if you need support or information about quitting drugs. Alcohol use  Do not drink alcohol if: ? Your health care provider tells you not to drink. ? You are pregnant, may be pregnant, or are planning to become pregnant.  If you drink alcohol: ? Limit how much you use to 0-1 drink a day. ? Limit intake if you are breastfeeding.  Be aware of how much alcohol is in your drink. In the [place] one drink equals one 12 oz bottle of beer (355 mL), one 5 oz glass of wine (148 mL), or one 1 oz glass of hard liquor (44 mL). General instructions  Schedule regular health, dental, and eye exams.  Stay current with your vaccines.  Tell your health care provider if: ? You often feel depressed. ? You have  ever been abused or do not feel safe at home. Summary  Adopting a healthy lifestyle and getting preventive care are important in promoting health and wellness.  Follow your health care provider's instructions about healthy diet, exercising, and getting tested or screened for diseases.  Follow your health care provider's instructions on monitoring your cholesterol and blood pressure. This information is not intended to replace advice given to you by your health care provider. Make sure you discuss any questions you have with your health care provider. Document Revised: 02/02/2018 Document Reviewed: 02/02/2018 Elsevier Patient Education  [place] over the counter for hot flashes this may take up to 30 days to take effect  May take melatonin over the counter for sleep

## 2019-03-28 NOTE — Progress Notes (Addendum)
Subjective:     Patient ID: Stephanie Garrison , female    DOB: 02-17-1966 , 54 y.o.   MRN: [id number]   Chief Complaint  Patient presents with  . Annual Exam    Patient's last menstrual period was 02/12/2019 (approximate). Irregular menstrual cycles.  Negative for Dysmenorrhea and Negative for Menorrhagia , menstrual cycles are irregular.  Mammogram last done 2015 noted in computer. Negative for: breast discharge, breast lump(s), breast pain and breast Garrison exam.  Pertinent negatives include abnormal bleeding (hematology), anxiety, decreased libido, depression, difficulty falling sleep, dyspareunia, history of infertility, nocturia, sexual dysfunction, sleep disturbances, urinary incontinence, urinary urgency, vaginal discharge and vaginal itching. Diet regular.  The patient states her exercise level is minimal.       The patient's tobacco use is:  Social History   Tobacco Use  Smoking Status Never Smoker  Smokeless Tobacco Never Used   She has been exposed to passive smoke. The patient's alcohol use is:  Social History   Substance and Sexual Activity  Alcohol Use Yes   Comment: 3 drinks tonight or more    Additional information: Last pap 2019 per patient, next one scheduled for 2022.   HPI  Here for HM  Thyroid Problem Presents for follow-up visit. Patient reports no anxiety, fatigue or palpitations.  Hypertension This is a chronic problem. The current episode started more than 1 year ago. The problem is unchanged. The problem is controlled. Pertinent negatives include no anxiety, chest pain, headaches or palpitations. There are no associated agents to hypertension. Risk factors for coronary artery disease include sedentary lifestyle. Past treatments include angiotensin blockers and diuretics. There are no compliance problems.  Identifiable causes of hypertension include a thyroid problem.  Otalgia  There is pain in the right ear. This is a new problem. The current episode  started more than 1 month ago. The problem occurs every few hours. There has been no fever. Pertinent negatives include no abdominal pain, ear discharge or headaches. Associated symptoms comments: Seen by dentist who felt swollen gland on the right. She has tried nothing for the symptoms. The treatment provided no relief.  Hip Pain  The incident occurred more than 1 week ago. There was no injury mechanism. The pain is present in the right hip. The quality of the pain is described as aching. The pain is moderate. The pain has been worsening since onset. Pertinent negatives include no inability to bear weight or tingling. The symptoms are aggravated by movement. She has tried acetaminophen for the symptoms. The treatment provided mild relief.     Past Medical History:  Diagnosis Date  . GERD (gastroesophageal reflux disease)    OTC  . Hypertension   . Hypothyroidism   . RBBB (right bundle branch block)    recurrent, asymptomatic  . S/P CABG (coronary artery bypass graft) 1964   when she was 54 years old  . S/P repair of tetralogy of Fallot    2D ECHO, 10/10/2009 - EF >55%, normal to mild  . Thyroid disease    hypothyroidism     Family History  Problem Relation Age of Onset  . Hyperlipidemia Mother      Current Outpatient Medications:  .  amLODipine (NORVASC) 5 MG tablet, TAKE 1 TABLET DAILY, Disp: 90 tablet, Rfl: 3 .  diclofenac sodium (VOLTAREN) 1 % GEL, Apply 2 g topically 4 (four) times daily., Disp: 100 g, Rfl: 2 .  levothyroxine (SYNTHROID) 50 MCG tablet, TAKE 1 TABLET DAILY, Disp: 90 tablet,  Rfl: 3 .  olmesartan-hydrochlorothiazide (BENICAR HCT) 40-25 MG tablet, TAKE 1 TABLET DAILY (NEED AN APPOINTMENT), Disp: 90 tablet, Rfl: 1 .  pravastatin (PRAVACHOL) 10 MG tablet, TAKE 1 TABLET BY MOUTH EVERY DAY, Disp: 90 tablet, Rfl: 1 .  Vitamin D, Ergocalciferol, (DRISDOL) 1.25 MG (50000 UT) CAPS capsule, TAKE 1 CAPSULE (50,000 UNITS TOTAL) BY MOUTH EVERY 7 (SEVEN) DAYS., Disp: 4 capsule,  Rfl: 2   No Known Allergies   Review of Systems  Constitutional: Negative.  Negative for fatigue and fever.  HENT: Positive for ear pain. Negative for ear discharge.   Eyes: Negative.   Respiratory: Negative.   Cardiovascular: Negative.  Negative for chest pain, palpitations and leg swelling.  Gastrointestinal: Negative.  Negative for abdominal pain.  Endocrine: Negative.   Genitourinary: Negative.   Musculoskeletal: Negative.   Skin: Negative.   Allergic/Immunologic: Negative.   Neurological: Negative for dizziness, tingling and headaches.  Hematological: Negative.   Psychiatric/Behavioral: Negative.  The patient is not nervous/anxious.      Today's Vitals   03/28/19 1108  BP: 116/84  Pulse: 82  Temp: 98.2 F (36.8 C)  TempSrc: Oral  Weight: 172 lb 12.8 oz (78.4 kg)  Height: 5' 1.2" (1.554 m)  PainSc: 0-No pain   Body mass index is 32.44 kg/m.   Objective:  Physical Exam Constitutional:      General: She is not in acute distress.    Appearance: Normal appearance. She is well-developed. She is obese.  HENT:     Head: Normocephalic and atraumatic.     Right Ear: Hearing, ear canal and external ear normal. There is no impacted cerumen. Tympanic membrane is bulging.     Left Ear: Hearing, ear canal and external ear normal. There is no impacted cerumen. Tympanic membrane is bulging.  Eyes:     General: Lids are normal.     Extraocular Movements: Extraocular movements intact.     Conjunctiva/sclera: Conjunctivae normal.     Pupils: Pupils are equal, round, and reactive to light.     Funduscopic exam:    Right eye: No papilledema.        Left eye: No papilledema.  Neck:     Thyroid: No thyroid mass.     Vascular: No carotid bruit.  Cardiovascular:     Rate and Rhythm: Normal rate and regular rhythm.     Pulses: Normal pulses.     Heart sounds: Normal heart sounds. No murmur.  Pulmonary:     Effort: Pulmonary effort is normal. No respiratory distress.     Breath  sounds: Normal breath sounds.  Chest:     Breasts:        Right: Normal. No mass, nipple discharge or tenderness.        Left: Normal. No mass, nipple discharge or tenderness.  Abdominal:     General: Abdomen is flat. Bowel sounds are normal.     Palpations: Abdomen is soft.  Musculoskeletal:        General: No swelling. Normal range of motion.     Cervical back: Full passive range of motion without pain, normal range of motion and neck supple.     Right lower leg: No edema.     Left lower leg: No edema.  Skin:    General: Skin is warm and dry.     Capillary Refill: Capillary refill takes less than 2 seconds.  Neurological:     General: No focal deficit present.     Mental Status: She is  alert and oriented to person, place, and time.     Cranial Nerves: No cranial nerve deficit.     Sensory: No sensory deficit.  Psychiatric:        Mood and Affect: Mood normal.        Behavior: Behavior normal.        Thought Content: Thought content normal.        Judgment: Judgment normal.         Assessment And Plan:   1. Health maintenance examination . Behavior modifications discussed and diet history reviewed.   . Pt will continue to exercise regularly and modify diet with low GI, plant based foods and decrease intake of processed foods.  . Recommend intake of daily multivitamin, Vitamin D, and calcium.  . Recommend mammogram  for preventive screenings, as well as recommend immunizations that include influenza, TDAP  2. Essential hypertension . B/P is controlled.  . CMP ordered to check renal function.  . The importance of regular exercise and dietary modification was stressed to the patient.  . Stressed importance of losing ten percent of her body weight to help with B/P control.  - CMP14+EGFR - POCT Urinalysis Dipstick (81002) - POCT UA - Microalbumin  3. Mixed hyperlipidemia  Chronic, controlled  Continue with current medications - CMP14+EGFR - Lipid panel  4. Acquired  hypothyroidism  Chronic, controlled  Continue with current medications - TSH - T4 - T3, free  5. Vitamin D deficiency  Will check vitamin D level and supplement as needed.     Also encouraged to spend 15 minutes in the sun daily.  - VITAMIN D 25 Hydroxy (Vit-D Deficiency, Fractures)  6. Right hip pain  Persistent right hip pain  Will order a hip xray and sent Rx for prednisone taper for likely arthritis - predniSONE (STERAPRED UNI-PAK 21 TAB) 10 MG (21) TBPK tablet; Take as directed  Dispense: 21 tablet; Refill: 0 - DG HIP UNILAT WITH PELVIS 2-3 VIEWS RIGHT; Future  7. Hot flashes  Intermittent hot flashes will check metabolic cause however she is in menopausal age range  Encouraged to avoid high sugar foods.  - Hemoglobin A1c  8. Other abnormal glucose  Chronic, stable  No current medications  Encouraged to limit intake of sugary foods and drinks  Encouraged to increase physical activity to 150 minutes per week  9. Right ear pain  Bilateral TM bulging advised to take over the counter antihistamine     [name], FNP

## 2019-03-29 ENCOUNTER — Encounter: Payer: Self-pay | Admitting: Nurse Practitioner

## 2019-03-29 ENCOUNTER — Encounter: Payer: Managed Care, Other (non HMO) | Admitting: Nurse Practitioner

## 2019-03-29 LAB — T4: T4, Total: 6.8 ug/dL (ref 4.5–12.0)

## 2019-03-29 LAB — CMP14+EGFR
ALT: 17 IU/L (ref 0–32)
AST: 20 IU/L (ref 0–40)
Albumin/Globulin Ratio: 1.6 (ref 1.2–2.2)
Albumin: 4.5 g/dL (ref 3.8–4.9)
Alkaline Phosphatase: 61 IU/L (ref 39–117)
BUN/Creatinine Ratio: 21 (ref 9–23)
BUN: 19 mg/dL (ref 6–24)
Bilirubin Total: 0.4 mg/dL (ref 0.0–1.2)
CO2: 25 mmol/L (ref 20–29)
Calcium: 9.5 mg/dL (ref 8.7–10.2)
Chloride: 99 mmol/L (ref 96–106)
Creatinine, Ser: 0.91 mg/dL (ref 0.57–1.00)
GFR calc Af Amer: 83 mL/min/{1.73_m2} (ref >59)
GFR calc non Af Amer: 72 mL/min/{1.73_m2} (ref >59)
Globulin, Total: 2.8 g/dL (ref 1.5–4.5)
Glucose: 94 mg/dL (ref 65–99)
Potassium: 3.6 mmol/L (ref 3.5–5.2)
Sodium: 138 mmol/L (ref 134–144)
Total Protein: 7.3 g/dL (ref 6.0–8.5)

## 2019-03-29 LAB — LIPID PANEL
Chol/HDL Ratio: 3 ratio (ref 0.0–4.4)
Cholesterol, Total: 195 mg/dL (ref 100–199)
HDL: 66 mg/dL (ref >39)
LDL Chol Calc (NIH): 108 mg/dL — ABNORMAL HIGH (ref 0–99)
Triglycerides: 122 mg/dL (ref 0–149)
VLDL Cholesterol Cal: 21 mg/dL (ref 5–40)

## 2019-03-29 LAB — T3, FREE: T3, Free: 2.8 pg/mL (ref 2.0–4.4)

## 2019-03-29 LAB — VITAMIN D 25 HYDROXY (VIT D DEFICIENCY, FRACTURES): Vit D, 25-Hydroxy: 62 ng/mL (ref 30.0–100.0)

## 2019-03-29 LAB — HEMOGLOBIN A1C
Est. average glucose Bld gHb Est-mCnc: 128 mg/dL
Hgb A1c MFr Bld: 6.1 % — ABNORMAL HIGH (ref 4.8–5.6)

## 2019-03-29 LAB — TSH: TSH: 1.36 u[IU]/mL (ref 0.450–4.500)

## 2019-04-05 ENCOUNTER — Telehealth: Payer: Self-pay

## 2019-04-05 ENCOUNTER — Other Ambulatory Visit: Payer: Self-pay

## 2019-04-05 ENCOUNTER — Other Ambulatory Visit: Payer: Self-pay | Admitting: Nurse Practitioner

## 2019-04-05 DIAGNOSIS — M25552 Pain in left hip: Secondary | ICD-10-CM

## 2019-04-05 NOTE — Telephone Encounter (Signed)
Patient called stating she was never given the number or address to where she needs to have her xrays done. Also she would like to get both hips xrayed.  I returned her call and notified her the order has been placed and also provided her with [place] phone number and address. YRL,RMA

## 2019-04-11 ENCOUNTER — Ambulatory Visit
Admission: RE | Admit: 2019-04-11 | Discharge: 2019-04-11 | Disposition: A | Discharge status: Home / Self Care | Payer: Managed Care, Other (non HMO) | Source: Ambulatory Visit | Attending: Nurse Practitioner | Admitting: Nurse Practitioner

## 2019-04-11 ENCOUNTER — Other Ambulatory Visit: Payer: Self-pay | Admitting: Nurse Practitioner

## 2019-04-11 DIAGNOSIS — M25551 Pain in right hip: Secondary | ICD-10-CM

## 2019-04-11 DIAGNOSIS — M25552 Pain in left hip: Secondary | ICD-10-CM

## 2019-04-12 ENCOUNTER — Other Ambulatory Visit: Payer: Self-pay | Admitting: Nurse Practitioner

## 2019-05-11 ENCOUNTER — Telehealth: Payer: Self-pay | Admitting: Cardiovascular Disease

## 2019-05-11 NOTE — Telephone Encounter (Signed)
We are recommending the COVID-19 vaccine to all of our patients. Cardiac medications (including blood thinners) should not deter anyone from being vaccinated and there is no need to hold any of those medications prior to vaccine administration.     Currently, there is a hotline to call (active 03/03/19) to schedule vaccination appointments as no walk-ins will be accepted.   Number: [phone].    If an appointment is not available please go to [email or web address] to sign up for notification when additional vaccine appointments are available.   If you have further questions or concerns about the vaccine process, please visit [email or web address] or contact your primary care physician.   

## 2019-07-21 ENCOUNTER — Other Ambulatory Visit: Payer: Self-pay | Admitting: Nurse Practitioner

## 2019-07-26 ENCOUNTER — Ambulatory Visit (INDEPENDENT_AMBULATORY_CARE_PROVIDER_SITE_OTHER): Payer: Managed Care, Other (non HMO) | Admitting: Nurse Practitioner

## 2019-07-26 ENCOUNTER — Encounter: Payer: Self-pay | Admitting: Nurse Practitioner

## 2019-07-26 ENCOUNTER — Other Ambulatory Visit: Payer: Self-pay

## 2019-07-26 VITALS — BP 118/84 | HR 83 | Temp 98.3°F | Ht 61.0 in | Wt 176.0 lb

## 2019-07-26 DIAGNOSIS — R351 Nocturia: Secondary | ICD-10-CM | POA: Diagnosis not present

## 2019-07-26 DIAGNOSIS — I10 Essential (primary) hypertension: Secondary | ICD-10-CM

## 2019-07-26 DIAGNOSIS — R7309 Other abnormal glucose: Secondary | ICD-10-CM | POA: Diagnosis not present

## 2019-07-26 NOTE — Progress Notes (Signed)
Subjective:     Patient ID: Stephanie Garrison , female    DOB: [birth year] , 54 y.o.   MRN: [id number]   Chief Complaint  Patient presents with  . Hypertension    HPI   Stephanie Garrison presents today for her three month HTN follow up. She is watching her sodium but does report she is eating poorly and states that she is motivated to get back in shape. She is not exercising currently due to leg pain. She states that the voltaren gel is helping with the discomfort. She is complaint with her medications and reports no side effects. She does report frequent urination at night that wakes her up from sleep. We discussed her risk for DM II due to her increased AIC result during physical earlier this year. We discussed diabetic risk and also the DASH diet and ADA recommendations.  She reports compliance with medications.  Wt Readings from Last 3 Encounters: 07/26/19 : 176 lb (79.8 kg) 03/28/19 : 172 lb 12.8 oz (78.4 kg) 12/12/18 : 170 lb 12.8 oz (77.5 kg)  Hypertension This is a chronic problem. The current episode started more than 1 year ago. The problem is controlled. There are no associated agents to hypertension. There are no known risk factors for coronary artery disease. Past treatments include diuretics and angiotensin blockers. The current treatment provides moderate improvement. Compliance problems include exercise and diet.  There is no history of angina or kidney disease. There is no history of chronic renal disease.     Past Medical History:  Diagnosis Date  . GERD (gastroesophageal reflux disease)    OTC  . Hypertension   . Hypothyroidism   . RBBB (right bundle branch block)    recurrent, asymptomatic  . S/P CABG (coronary artery bypass graft) 1964   when she was 54 years old  . S/P repair of tetralogy of Fallot    2D ECHO, 10/10/2009 - EF >55%, normal to mild  . Thyroid disease    hypothyroidism     Family History  Problem Relation Age of Onset  . Hyperlipidemia Mother       Current Outpatient Medications:  .  amLODipine (NORVASC) 5 MG tablet, TAKE 1 TABLET DAILY, Disp: 90 tablet, Rfl: 3 .  diclofenac sodium (VOLTAREN) 1 % GEL, Apply 2 g topically 4 (four) times daily., Disp: 100 g, Rfl: 2 .  levothyroxine (SYNTHROID) 50 MCG tablet, TAKE 1 TABLET DAILY, Disp: 90 tablet, Rfl: 3 .  olmesartan-hydrochlorothiazide (BENICAR HCT) 40-25 MG tablet, TAKE 1 TABLET DAILY (NEED AN APPOINTMENT), Disp: 90 tablet, Rfl: 1 .  pravastatin (PRAVACHOL) 10 MG tablet, TAKE 1 TABLET DAILY, Disp: 90 tablet, Rfl: 3 .  predniSONE (STERAPRED UNI-PAK 21 TAB) 10 MG (21) TBPK tablet, Take as directed, Disp: 21 tablet, Rfl: 0 .  Vitamin D, Ergocalciferol, (DRISDOL) 1.25 MG (50000 UNIT) CAPS capsule, TAKE 1 CAPSULE EVERY 7 DAYS, Disp: 12 capsule, Rfl: 3   No Known Allergies   Review of Systems  Constitutional: Negative.   Respiratory: Negative.   Cardiovascular: Negative.   Endocrine: Positive for polyuria.       Reports at night that wakes from sleep  Musculoskeletal: Positive for arthralgias and joint swelling.       Knee leg pain  Skin: Negative.   Psychiatric/Behavioral: Negative.      There were no vitals filed for this visit. There is no height or weight on file to calculate BMI.   Objective:  Physical Exam Constitutional:  Appearance: Normal appearance.  Cardiovascular:     Rate and Rhythm: Normal rate and regular rhythm.     Heart sounds: Normal heart sounds.  Pulmonary:     Effort: Pulmonary effort is normal.     Breath sounds: Normal breath sounds.  Skin:    General: Skin is warm and dry.  Neurological:     Mental Status: She is alert.  Psychiatric:        Mood and Affect: Mood normal.        Behavior: Behavior normal.        Thought Content: Thought content normal.        Judgment: Judgment normal.         Assessment And Plan:      1. Essential hypertension -Chronic, good control -We discussed her current diet and lb weight gain over the past  4 months -We discussed the DASH diet and reducing sodium an d sugar intake.  2. Other abnormal glucose - chronic, slightly elevated at last visit - discussed importance of regular exercise -We discussed her risk for Pre diabetes and she understands that she is at increased risk due to her current diet.  3. Nocturia - Will send for sleep study  - Discussed risk of cardiac events, elevated blood pressure and glucose levels with poor sleep. - Ambulatory referral to Sleep Studies            Marylu Lund, RN   [name], DNP, FNP-BC  COVID-19 Education: The signs and symptoms of COVID-19 were discussed with the patient and how to seek care for testing (follow up with PCP or arrange E-visit).  The importance of social distancing was discussed today.   Patient Risk:   After full review of this patients clinical status, I feel that they are at least moderate risk at this time.   THE PATIENT IS ENCOURAGED TO PRACTICE SOCIAL DISTANCING DUE TO THE COVID-19 PANDEMIC.

## 2019-07-26 NOTE — Patient Instructions (Addendum)
DASH Eating Plan DASH stands for "Dietary Approaches to Stop Hypertension." The DASH eating plan is a healthy eating plan that has been shown to reduce high blood pressure (hypertension). It may also reduce your risk for type 2 diabetes, heart disease, and stroke. The DASH eating plan may also help with weight loss. What are tips for following this plan?  General guidelines  Avoid eating more than 2,300 mg (milligrams) of salt (sodium) a day. If you have hypertension, you may need to reduce your sodium intake to 1,500 mg a day.  Limit alcohol intake to no more than 1 drink a day for nonpregnant women and 2 drinks a day for men. One drink equals 12 oz of beer, 5 oz of wine, or 1 oz of hard liquor.  Work with your health care provider to maintain a healthy body weight or to lose weight. Ask what an ideal weight is for you.  Get at least 30 minutes of exercise that causes your heart to beat faster (aerobic exercise) most days of the week. Activities may include walking, swimming, or biking.  Work with your health care provider or diet and nutrition specialist (dietitian) to adjust your eating plan to your individual calorie needs. Reading food labels   Check food labels for the amount of sodium per serving. Choose foods with less than 5 percent of the Daily Value of sodium. Generally, foods with less than 300 mg of sodium per serving fit into this eating plan.  To find whole grains, look for the word "whole" as the first word in the ingredient list. Shopping  Buy products labeled as "low-sodium" or "no salt added."  Buy fresh foods. Avoid canned foods and premade or frozen meals. Cooking  Avoid adding salt when cooking. Use salt-free seasonings or herbs instead of table salt or sea salt. Check with your health care provider or pharmacist before using salt substitutes.  Do not fry foods. Cook foods using healthy methods such as baking, boiling, grilling, and broiling instead.  Cook with  heart-healthy oils, such as olive, canola, soybean, or sunflower oil. Meal planning  Eat a balanced diet that includes: ? 5 or more servings of fruits and vegetables each day. At each meal, try to fill half of your plate with fruits and vegetables. ? Up to 6-8 servings of whole grains each day. ? Less than 6 oz of lean meat, poultry, or fish each day. A 3-oz serving of meat is about the same size as a deck of cards. One egg equals 1 oz. ? 2 servings of low-fat dairy each day. ? A serving of nuts, seeds, or beans 5 times each week. ? Heart-healthy fats. Healthy fats called Omega-3 fatty acids are found in foods such as flaxseeds and coldwater fish, like sardines, salmon, and mackerel.  Limit how much you eat of the following: ? Canned or prepackaged foods. ? Food that is high in trans fat, such as fried foods. ? Food that is high in saturated fat, such as fatty meat. ? Sweets, desserts, sugary drinks, and other foods with added sugar. ? Full-fat dairy products.  Do not salt foods before eating.  Try to eat at least 2 vegetarian meals each week.  Eat more home-cooked food and less restaurant, buffet, and fast food.  When eating at a restaurant, ask that your food be prepared with less salt or no salt, if possible. What foods are recommended? The items listed may not be a complete list. Talk with your dietitian about   what dietary choices are best for you. Grains Whole-grain or whole-wheat bread. Whole-grain or whole-wheat pasta. Brown rice. Oatmeal. Quinoa. Bulgur. Whole-grain and low-sodium cereals. Pita bread. Low-fat, low-sodium crackers. Whole-wheat flour tortillas. Vegetables Fresh or frozen vegetables (raw, steamed, roasted, or grilled). Low-sodium or reduced-sodium tomato and vegetable juice. Low-sodium or reduced-sodium tomato sauce and tomato paste. Low-sodium or reduced-sodium canned vegetables. Fruits All fresh, dried, or frozen fruit. Canned fruit in natural juice (without  added sugar). Meat and other protein foods Skinless chicken or turkey. Ground chicken or turkey. Pork with fat trimmed off. Fish and seafood. Egg whites. Dried beans, peas, or lentils. Unsalted nuts, nut butters, and seeds. Unsalted canned beans. Lean cuts of beef with fat trimmed off. Low-sodium, lean deli meat. Dairy Low-fat (1%) or fat-free (skim) milk. Fat-free, low-fat, or reduced-fat cheeses. Nonfat, low-sodium ricotta or cottage cheese. Low-fat or nonfat yogurt. Low-fat, low-sodium cheese. Fats and oils Soft margarine without trans fats. Vegetable oil. Low-fat, reduced-fat, or light mayonnaise and salad dressings (reduced-sodium). Canola, safflower, olive, soybean, and sunflower oils. Avocado. Seasoning and other foods Herbs. Spices. Seasoning mixes without salt. Unsalted popcorn and pretzels. Fat-free sweets. What foods are not recommended? The items listed may not be a complete list. Talk with your dietitian about what dietary choices are best for you. Grains Baked goods made with fat, such as croissants, muffins, or some breads. Dry pasta or rice meal packs. Vegetables Creamed or fried vegetables. Vegetables in a cheese sauce. Regular canned vegetables (not low-sodium or reduced-sodium). Regular canned tomato sauce and paste (not low-sodium or reduced-sodium). Regular tomato and vegetable juice (not low-sodium or reduced-sodium). Pickles. Olives. Fruits Canned fruit in a light or heavy syrup. Fried fruit. Fruit in cream or butter sauce. Meat and other protein foods Fatty cuts of meat. Ribs. Fried meat. Bacon. Sausage. Bologna and other processed lunch meats. Salami. Fatback. Hotdogs. Bratwurst. Salted nuts and seeds. Canned beans with added salt. Canned or smoked fish. Whole eggs or egg yolks. Chicken or turkey with skin. Dairy Whole or 2% milk, cream, and half-and-half. Whole or full-fat cream cheese. Whole-fat or sweetened yogurt. Full-fat cheese. Nondairy creamers. Whipped toppings.  Processed cheese and cheese spreads. Fats and oils Butter. Stick margarine. Lard. Shortening. Ghee. Bacon fat. Tropical oils, such as coconut, palm kernel, or palm oil. Seasoning and other foods Salted popcorn and pretzels. Onion salt, garlic salt, seasoned salt, table salt, and sea salt. Worcestershire sauce. Tartar sauce. Barbecue sauce. Teriyaki sauce. Soy sauce, including reduced-sodium. Steak sauce. Canned and packaged gravies. Fish sauce. Oyster sauce. Cocktail sauce. Horseradish that you find on the shelf. Ketchup. Mustard. Meat flavorings and tenderizers. Bouillon cubes. Hot sauce and Tabasco sauce. Premade or packaged marinades. Premade or packaged taco seasonings. Relishes. Regular salad dressings. Where to find more information:  National Heart, Lung, and Blood Institute: www.nhlbi.nih.gov  American Heart Association: www.heart.org Summary  The DASH eating plan is a healthy eating plan that has been shown to reduce high blood pressure (hypertension). It may also reduce your risk for type 2 diabetes, heart disease, and stroke.  With the DASH eating plan, you should limit salt (sodium) intake to 2,300 mg a day. If you have hypertension, you may need to reduce your sodium intake to 1,500 mg a day.  When on the DASH eating plan, aim to eat more fresh fruits and vegetables, whole grains, lean proteins, low-fat dairy, and heart-healthy fats.  Work with your health care provider or diet and nutrition specialist (dietitian) to adjust your eating plan to your   individual calorie needs. This information is not intended to replace advice given to you by your health care provider. Make sure you discuss any questions you have with your health care provider. Document Revised: 01/22/2017 Document Reviewed: 02/03/2016 Elsevier Patient Education  2020 [place] - good starch   Healthy Eating Following a healthy eating pattern may help you to achieve and maintain a healthy body  weight, reduce the risk of chronic disease, and live a long and productive life. It is important to follow a healthy eating pattern at an appropriate calorie level for your body. Your nutritional needs should be met primarily through food by choosing a variety of nutrient-rich foods. What are tips for following this plan? Reading food labels  Read labels and choose the following: ? Reduced or low sodium. ? Juices with 100% fruit juice. ? Foods with low saturated fats and high polyunsaturated and monounsaturated fats. ? Foods with whole grains, such as whole wheat, cracked wheat, brown rice, and wild rice. ? Whole grains that are fortified with folic acid. This is recommended for women who are pregnant or who want to become pregnant.  Read labels and avoid the following: ? Foods with a lot of added sugars. These include foods that contain brown sugar, corn sweetener, corn syrup, dextrose, fructose, glucose, high-fructose corn syrup, honey, invert sugar, lactose, malt syrup, maltose, molasses, raw sugar, sucrose, trehalose, or turbinado sugar.  Do not eat more than the following amounts of added sugar per day:  6 teaspoons (25 g) for women.  9 teaspoons (38 g) for men. ? Foods that contain processed or refined starches and grains. ? Refined grain products, such as white flour, degermed cornmeal, white bread, and white rice. Shopping  Choose nutrient-rich snacks, such as vegetables, whole fruits, and nuts. Avoid high-calorie and high-sugar snacks, such as potato chips, fruit snacks, and candy.  Use oil-based dressings and spreads on foods instead of solid fats such as butter, stick margarine, or cream cheese.  Limit pre-made sauces, mixes, and "instant" products such as flavored rice, instant noodles, and ready-made pasta.  Try more plant-protein sources, such as tofu, tempeh, black beans, edamame, lentils, nuts, and seeds.  Explore eating plans such as the Mediterranean diet or  vegetarian diet. Cooking  Use oil to saut or stir-fry foods instead of solid fats such as butter, stick margarine, or lard.  Try baking, boiling, grilling, or broiling instead of frying.  Remove the fatty part of meats before cooking.  Steam vegetables in water or broth. Meal planning   At meals, imagine dividing your plate into fourths: ? One-half of your plate is fruits and vegetables. ? One-fourth of your plate is whole grains. ? One-fourth of your plate is protein, especially lean meats, poultry, eggs, tofu, beans, or nuts.  Include low-fat dairy as part of your daily diet. Lifestyle  Choose healthy options in all settings, including home, work, school, restaurants, or stores.  Prepare your food safely: ? Wash your hands after handling raw meats. ? Keep food preparation surfaces clean by regularly washing with hot, soapy water. ? Keep raw meats separate from ready-to-eat foods, such as fruits and vegetables. ? Cook seafood, meat, poultry, and eggs to the recommended internal temperature. ? Store foods at safe temperatures. In general:  Keep cold foods at 17F (4.4C) or below.  Keep hot foods at 117F (60C) or above.  Keep your freezer at [facility] (-17.8C) or below.  Foods are no longer safe to eat when  they have been between the temperatures of 40-140F (4.4-60C) for more than 2 hours. What foods should I eat? Fruits Aim to eat 2 cup-equivalents of fresh, canned (in natural juice), or frozen fruits each day. Examples of 1 cup-equivalent of fruit include 1 small apple, 8 large strawberries, 1 cup canned fruit,  cup dried fruit, or 1 cup 100% juice. Vegetables Aim to eat 2-3 cup-equivalents of fresh and frozen vegetables each day, including different varieties and colors. Examples of 1 cup-equivalent of vegetables include 2 medium carrots, 2 cups raw, leafy greens, 1 cup chopped vegetable (raw or cooked), or 1 medium baked potato. Grains Aim to eat 6 ounce-equivalents  of whole grains each day. Examples of 1 ounce-equivalent of grains include 1 slice of bread, 1 cup ready-to-eat cereal, 3 cups popcorn, or  cup cooked rice, pasta, or cereal. Meats and other proteins Aim to eat 5-6 ounce-equivalents of protein each day. Examples of 1 ounce-equivalent of protein include 1 egg, 1/2 cup nuts or seeds, or 1 tablespoon (16 g) peanut butter. A cut of meat or fish that is the size of a deck of cards is about 3-4 ounce-equivalents.  Of the protein you eat each week, try to have at least 8 ounces come from seafood. This includes salmon, trout, herring, and anchovies. Dairy Aim to eat 3 cup-equivalents of fat-free or low-fat dairy each day. Examples of 1 cup-equivalent of dairy include 1 cup (240 mL) milk, 8 ounces (250 g) yogurt, 1 ounces (44 g) natural cheese, or 1 cup (240 mL) fortified soy milk. Fats and oils  Aim for about 5 teaspoons (21 g) per day. Choose monounsaturated fats, such as canola and olive oils, avocados, peanut butter, and most nuts, or polyunsaturated fats, such as sunflower, corn, and soybean oils, walnuts, pine nuts, sesame seeds, sunflower seeds, and flaxseed. Beverages  Aim for six 8-oz glasses of water per day. Limit coffee to three to five 8-oz cups per day.  Limit caffeinated beverages that have added calories, such as soda and energy drinks.  Limit alcohol intake to no more than 1 drink a day for nonpregnant women and 2 drinks a day for men. One drink equals 12 oz of beer (355 mL), 5 oz of wine (148 mL), or 1 oz of hard liquor (44 mL). Seasoning and other foods  Avoid adding excess amounts of salt to your foods. Try flavoring foods with herbs and spices instead of salt.  Avoid adding sugar to foods.  Try using oil-based dressings, sauces, and spreads instead of solid fats. This information is based on general [place] nutrition guidelines. For more information, visit [employer]es. Exact amounts may vary based on your nutrition  needs. Summary  A healthy eating plan may help you to maintain a healthy weight, reduce the risk of chronic diseases, and stay active throughout your life.  Plan your meals. Make sure you eat the right portions of a variety of nutrient-rich foods.  Try baking, boiling, grilling, or broiling instead of frying.  Choose healthy options in all settings, including home, work, school, restaurants, or stores. This information is not intended to replace advice given to you by your health care provider. Make sure you discuss any questions you have with your health care provider. Document Revised: 05/24/2017 Document Reviewed: 05/24/2017 Elsevier Patient Education  [place].

## 2019-07-27 LAB — BMP8+EGFR
BUN/Creatinine Ratio: 25 — ABNORMAL HIGH (ref 9–23)
BUN: 17 mg/dL (ref 6–24)
CO2: 24 mmol/L (ref 20–29)
Calcium: 9.6 mg/dL (ref 8.7–10.2)
Chloride: 98 mmol/L (ref 96–106)
Creatinine, Ser: 0.69 mg/dL (ref 0.57–1.00)
GFR calc Af Amer: 115 mL/min/{1.73_m2} (ref >59)
GFR calc non Af Amer: 100 mL/min/{1.73_m2} (ref >59)
Glucose: 107 mg/dL — ABNORMAL HIGH (ref 65–99)
Potassium: 3.5 mmol/L (ref 3.5–5.2)
Sodium: 137 mmol/L (ref 134–144)

## 2019-07-27 LAB — HEMOGLOBIN A1C
Est. average glucose Bld gHb Est-mCnc: 123 mg/dL
Hgb A1c MFr Bld: 5.9 % — ABNORMAL HIGH (ref 4.8–5.6)

## 2019-09-07 ENCOUNTER — Other Ambulatory Visit: Payer: Self-pay | Admitting: Nurse Practitioner

## 2019-11-01 ENCOUNTER — Ambulatory Visit: Payer: Managed Care, Other (non HMO) | Admitting: Nurse Practitioner

## 2019-11-02 ENCOUNTER — Ambulatory Visit: Payer: Managed Care, Other (non HMO) | Admitting: Nurse Practitioner

## 2019-11-02 ENCOUNTER — Encounter: Payer: Self-pay | Admitting: Nurse Practitioner

## 2019-11-02 ENCOUNTER — Other Ambulatory Visit: Payer: Self-pay

## 2019-11-02 VITALS — BP 126/84 | HR 75 | Temp 98.2°F | Ht 61.0 in | Wt 167.0 lb

## 2019-11-02 DIAGNOSIS — R7309 Other abnormal glucose: Secondary | ICD-10-CM

## 2019-11-02 DIAGNOSIS — E559 Vitamin D deficiency, unspecified: Secondary | ICD-10-CM | POA: Diagnosis not present

## 2019-11-02 DIAGNOSIS — E039 Hypothyroidism, unspecified: Secondary | ICD-10-CM

## 2019-11-02 DIAGNOSIS — E782 Mixed hyperlipidemia: Secondary | ICD-10-CM

## 2019-11-02 DIAGNOSIS — I10 Essential (primary) hypertension: Secondary | ICD-10-CM | POA: Diagnosis not present

## 2019-11-02 DIAGNOSIS — Z1159 Encounter for screening for other viral diseases: Secondary | ICD-10-CM

## 2019-11-02 DIAGNOSIS — Z6831 Body mass index (BMI) 31.0-31.9, adult: Secondary | ICD-10-CM | POA: Insufficient documentation

## 2019-11-02 NOTE — Progress Notes (Signed)
Rutherford Nail as a scribe for [name], FNP.,have documented all relevant documentation on the behalf of [name], FNP,as directed by  [name], FNP while in the presence of [name], [place].  This visit occurred during the SARS-CoV-2 public health emergency.  Safety protocols were in place, including screening questions prior to the visit, additional usage of staff PPE, and extensive cleaning of exam room while observing appropriate contact time as indicated for disinfecting solutions.  Subjective:     Patient ID: Stephanie Garrison , female    DOB: 1965/07/27 , 54 y.o.   MRN: [id number]   Chief Complaint  Patient presents with   Hypertension    HPI  Pt here today for hypertension f/u  Wt Readings from Last 3 Encounters: 11/02/19 : 167 lb (75.8 kg) 07/26/19 : 176 lb (79.8 kg) 03/28/19 : 172 lb 12.8 oz (78.4 kg)  She has been eating a healthier diet and smaller portions. Her last appt her HgbA1c was 5.9.    Hypertension This is a chronic problem. The current episode started more than 1 year ago. The problem is unchanged. The problem is controlled. Pertinent negatives include no chest pain or palpitations. There are no associated agents to hypertension. Risk factors for coronary artery disease include obesity. Past treatments include diuretics and angiotensin blockers. The current treatment provides moderate improvement. Compliance problems include exercise and diet.  There is no history of angina or kidney disease. There is no history of chronic renal disease.     Past Medical History:  Diagnosis Date   GERD (gastroesophageal reflux disease)    OTC   Hypertension    Hypothyroidism    RBBB (right bundle branch block)    recurrent, asymptomatic   S/P CABG (coronary artery bypass graft) 1964   when she was 54 years old   S/P repair of tetralogy of Fallot    2D ECHO, 10/10/2009 - EF >55%, normal to mild   Thyroid disease    hypothyroidism      Family History  Problem Relation Age of Onset   Hyperlipidemia Mother      Current Outpatient Medications:    amLODipine (NORVASC) 5 MG tablet, TAKE 1 TABLET DAILY, Disp: 90 tablet, Rfl: 3   diclofenac sodium (VOLTAREN) 1 % GEL, Apply 2 g topically 4 (four) times daily., Disp: 100 g, Rfl: 2   levothyroxine (SYNTHROID) 50 MCG tablet, TAKE 1 TABLET DAILY, Disp: 90 tablet, Rfl: 3   olmesartan-hydrochlorothiazide (BENICAR HCT) 40-25 MG tablet, TAKE 1 TABLET DAILY (NEED AN APPOINTMENT), Disp: 90 tablet, Rfl: 3   pravastatin (PRAVACHOL) 10 MG tablet, TAKE 1 TABLET DAILY, Disp: 90 tablet, Rfl: 3   Vitamin D, Ergocalciferol, (DRISDOL) 1.25 MG (50000 UNIT) CAPS capsule, TAKE 1 CAPSULE EVERY 7 DAYS, Disp: 12 capsule, Rfl: 3   No Known Allergies   Review of Systems  Constitutional: Negative.   HENT: Negative.   Respiratory: Negative.  Negative for cough.   Cardiovascular: Negative.  Negative for chest pain, palpitations and leg swelling.  Musculoskeletal: Negative.   Skin: Negative.   Allergic/Immunologic: Negative.   Neurological: Negative.   Psychiatric/Behavioral: Negative.      Today's Vitals   11/02/19 1001  BP: 126/84  Pulse: 75  Temp: 98.2 F (36.8 C)  TempSrc: Oral  Weight: 167 lb (75.8 kg)  Height: '5\' 1"'  (1.549 m)  PainSc: 0-No pain   Body mass index is 31.55 kg/m.   Objective:  Physical Exam Constitutional:      General:  She is not in acute distress.    Appearance: Normal appearance. She is obese.  Cardiovascular:     Rate and Rhythm: Normal rate and regular rhythm.     Pulses: Normal pulses.     Heart sounds: Normal heart sounds. No murmur heard.   Pulmonary:     Effort: Pulmonary effort is normal. No respiratory distress.     Breath sounds: Normal breath sounds.  Skin:    General: Skin is warm and dry.  Neurological:     General: No focal deficit present.     Mental Status: She is alert and oriented to person, place, and time.     Cranial  Nerves: No cranial nerve deficit.  Psychiatric:        Mood and Affect: Mood normal.        Behavior: Behavior normal.        Thought Content: Thought content normal.        Judgment: Judgment normal.         Assessment And Plan:     1. Essential hypertension  Chronic, well controlled  Continue with current medications - CMP14+EGFR  2. Acquired hypothyroidism  Chronic, controlled  Continue with current medications - T4 - T3, free - TSH  3. Mixed hyperlipidemia  Chronic, controlled  Continue with current medications, tolerating medications well - Lipid panel  4. Vitamin D deficiency  Will check vitamin D level and supplement as needed.     Also encouraged to spend 15 minutes in the sun daily.   5. Abnormal glucose  Last visit her HgbA1c was 5.9  Continue to limit intake of sugary foods and drinks  Encouraged to increase physical activity to 150 minutes per week - Hemoglobin A1c  6. BMI 31.0-31.9,adult  She is encouraged to strive for BMI less than 30 to decrease cardiac risk. Advised to aim for at least 150 minutes of exercise per week.  7. Encounter for hepatitis C screening test for low risk patient  Will check Hepatitis C screening due to recent recommendations to screen all adults 18 years and older - Hepatitis C antibody   She will   Patient was given opportunity to ask questions. Patient verbalized understanding of the plan and was able to repeat key elements of the plan. All questions were answered to their satisfaction.  [name], FNP   I, [name], FNP, have reviewed all documentation for this visit. The documentation on 11/02/19 for the exam, diagnosis, procedures, and orders are all accurate and complete.  THE PATIENT IS ENCOURAGED TO PRACTICE SOCIAL DISTANCING DUE TO THE COVID-19 PANDEMIC.

## 2019-11-03 LAB — HEMOGLOBIN A1C
Est. average glucose Bld gHb Est-mCnc: 123 mg/dL
Hgb A1c MFr Bld: 5.9 % — ABNORMAL HIGH (ref 4.8–5.6)

## 2019-11-03 LAB — CMP14+EGFR
ALT: 18 IU/L (ref 0–32)
AST: 23 IU/L (ref 0–40)
Albumin/Globulin Ratio: 1.4 (ref 1.2–2.2)
Albumin: 4 g/dL (ref 3.8–4.9)
Alkaline Phosphatase: 58 IU/L (ref 48–121)
BUN/Creatinine Ratio: 17 (ref 9–23)
BUN: 11 mg/dL (ref 6–24)
Bilirubin Total: 0.7 mg/dL (ref 0.0–1.2)
CO2: 27 mmol/L (ref 20–29)
Calcium: 9 mg/dL (ref 8.7–10.2)
Chloride: 98 mmol/L (ref 96–106)
Creatinine, Ser: 0.63 mg/dL (ref 0.57–1.00)
GFR calc Af Amer: 118 mL/min/{1.73_m2} (ref >59)
GFR calc non Af Amer: 103 mL/min/{1.73_m2} (ref >59)
Globulin, Total: 2.9 g/dL (ref 1.5–4.5)
Glucose: 104 mg/dL — ABNORMAL HIGH (ref 65–99)
Potassium: 3.6 mmol/L (ref 3.5–5.2)
Sodium: 136 mmol/L (ref 134–144)
Total Protein: 6.9 g/dL (ref 6.0–8.5)

## 2019-11-03 LAB — LIPID PANEL
Chol/HDL Ratio: 3.4 ratio (ref 0.0–4.4)
Cholesterol, Total: 216 mg/dL — ABNORMAL HIGH (ref 100–199)
HDL: 63 mg/dL (ref >39)
LDL Chol Calc (NIH): 120 mg/dL — ABNORMAL HIGH (ref 0–99)
Triglycerides: 192 mg/dL — ABNORMAL HIGH (ref 0–149)
VLDL Cholesterol Cal: 33 mg/dL (ref 5–40)

## 2019-11-03 LAB — T3, FREE: T3, Free: 3.4 pg/mL (ref 2.0–4.4)

## 2019-11-03 LAB — T4: T4, Total: 7.8 ug/dL (ref 4.5–12.0)

## 2019-11-03 LAB — TSH: TSH: 2.48 u[IU]/mL (ref 0.450–4.500)

## 2019-11-03 LAB — HEPATITIS C ANTIBODY: Hep C Virus Ab: <0.1 s/co ratio (ref 0.0–0.9)

## 2019-12-08 ENCOUNTER — Other Ambulatory Visit: Payer: Self-pay

## 2019-12-08 ENCOUNTER — Ambulatory Visit: Payer: Managed Care, Other (non HMO) | Admitting: Cardiovascular Disease

## 2019-12-08 ENCOUNTER — Encounter: Payer: Self-pay | Admitting: Cardiovascular Disease

## 2019-12-08 VITALS — BP 120/78 | HR 81 | Ht 64.0 in | Wt 164.0 lb

## 2019-12-08 DIAGNOSIS — E782 Mixed hyperlipidemia: Secondary | ICD-10-CM | POA: Diagnosis not present

## 2019-12-08 DIAGNOSIS — I4510 Unspecified right bundle-branch block: Secondary | ICD-10-CM | POA: Diagnosis not present

## 2019-12-08 DIAGNOSIS — Q249 Congenital malformation of heart, unspecified: Secondary | ICD-10-CM | POA: Diagnosis not present

## 2019-12-08 MED ORDER — PRAVASTATIN SODIUM 20 MG PO TABS: 20.0000 mg | ORAL_TABLET | Freq: Every day | ORAL | 11 refills | Status: DC

## 2019-12-08 NOTE — Assessment & Plan Note (Signed)
History of essential hypertension with blood pressure measured today 120/78.  She is on amlodipine, Benicar and hydrochlorothiazide

## 2019-12-08 NOTE — Progress Notes (Signed)
12/08/2019 Stephanie Garrison   1965-07-09  [id number]  Primary Physician [name], [place] Primary Cardiologist: Lorretta Harp MD Garret Reddish, [place], [place]  HPI:  Stephanie Garrison is a 54 y.o.  mildly overweight married [place] American female, mother of 1 child, who I last saw  10/12/2018. She  has a history of repaired tetralogy of Fallot back in the 61s. Her other problems include hypertension, chronic right bundle branch block. She has been asymptomatic since I saw her last except for 1 episode of what sounds like PAF as a result of excessive alcohol intake. Her last echocardiogram performed, October 10, 2009, showed normal LV size and function with normal valvular function, and borderline dilated right ventricle with normal RV systolic function. Since I saw her a year ago she has remained entirely asymptomatic.A 2-D echo performed 11/20/15 revealed normal LV size and function, mild pulmonary hypertension with no evidence of VSD.  Since I saw her back a year ago she has done well.    She denies chest pain or shortness of breath.  Current Meds  Medication Sig  . amLODipine (NORVASC) 5 MG tablet TAKE 1 TABLET DAILY  . diclofenac sodium (VOLTAREN) 1 % GEL Apply 2 g topically 4 (four) times daily.  Marland Kitchen levothyroxine (SYNTHROID) 50 MCG tablet TAKE 1 TABLET DAILY  . olmesartan-hydrochlorothiazide (BENICAR HCT) 40-25 MG tablet TAKE 1 TABLET DAILY (NEED AN APPOINTMENT)  . pravastatin (PRAVACHOL) 20 MG tablet Take 1 tablet (20 mg total) by mouth daily.  . Vitamin D, Ergocalciferol, (DRISDOL) 1.25 MG (50000 UNIT) CAPS capsule TAKE 1 CAPSULE EVERY 7 DAYS  . [DISCONTINUED] pravastatin (PRAVACHOL) 10 MG tablet TAKE 1 TABLET DAILY     No Known Allergies  Social History   Socioeconomic History  . Marital status: Married    Spouse name: Not on file  . Number of children: Not on file  . Years of education: Not on file  . Highest education level: Not on file  Occupational History  .  Not on file  Tobacco Use  . Smoking status: Never Smoker  . Smokeless tobacco: Never Used  Substance and Sexual Activity  . Alcohol use: Yes    Comment: 3 drinks tonight or more   . Drug use: No  . Sexual activity: Yes  Other Topics Concern  . Not on file  Social History Narrative  . Not on file   Social Determinants of Health   Financial Resource Strain:   . Difficulty of Paying Living Expenses: Not on file  Food Insecurity:   . Worried About Charity fundraiser in the Last Year: Not on file  . Ran Out of Food in the Last Year: Not on file  Transportation Needs:   . Lack of Transportation (Medical): Not on file  . Lack of Transportation (Non-Medical): Not on file  Physical Activity:   . Days of Exercise per Week: Not on file  . Minutes of Exercise per Session: Not on file  Stress:   . Feeling of Stress : Not on file  Social Connections:   . Frequency of Communication with Friends and Family: Not on file  . Frequency of Social Gatherings with Friends and Family: Not on file  . Attends Religious Services: Not on file  . Active Member of Clubs or Organizations: Not on file  . Attends Archivist Meetings: Not on file  . Marital Status: Not on file  Intimate Partner Violence:   . Fear of Current  or Ex-Partner: Not on file  . Emotionally Abused: Not on file  . Physically Abused: Not on file  . Sexually Abused: Not on file     Review of Systems: General: negative for chills, fever, night sweats or weight changes.  Cardiovascular: negative for chest pain, dyspnea on exertion, edema, orthopnea, palpitations, paroxysmal nocturnal dyspnea or shortness of breath Dermatological: negative for rash Respiratory: negative for cough or wheezing Urologic: negative for hematuria Abdominal: negative for nausea, vomiting, diarrhea, bright red blood per rectum, melena, or hematemesis Neurologic: negative for visual changes, syncope, or dizziness All other systems reviewed and  are otherwise negative except as noted above.    Blood pressure 120/78, pulse 81, height 5\' 4"  (1.626 m), weight 164 lb (74.4 kg).  General appearance: alert and no distress Neck: no adenopathy, no carotid bruit, no JVD, supple, symmetrical, trachea midline and thyroid not enlarged, symmetric, no tenderness/mass/nodules Lungs: clear to auscultation bilaterally Heart: Soft outflow tract murmur Extremities: extremities normal, atraumatic, no cyanosis or edema Pulses: 2+ and symmetric Skin: Skin color, texture, turgor normal. No rashes or lesions Neurologic: Alert and oriented X 3, normal strength and tone. Normal symmetric reflexes. Normal coordination and gait  EKG sinus rhythm 81 with right bundle branch block.  I personally reviewed this EKG.  ASSESSMENT AND PLAN:   Right bundle branch block Chronic  Essential hypertension History of essential hypertension with blood pressure measured today 120/78.  She is on amlodipine, Benicar and hydrochlorothiazide  Congenital heart disease History of congenital heart disease status post repair of tetralogy of Fallot back in 76s.  Most recent 2D echo performed 11/20/2015 revealed normal LV size and function, mild pulmonary hypertension with no evidence of VSD.  Mixed hyperlipidemia History of hyperlipidemia on low-dose Pravachol with lipid profile performed 11/02/2019 revealing total cholesterol 216, LDL 120 and HDL of 63.      Lorretta Harp MD FACP,FACC,FAHA, [facility] 12/08/2019 1:30 PM

## 2019-12-08 NOTE — Assessment & Plan Note (Signed)
Chronic. 

## 2019-12-08 NOTE — Assessment & Plan Note (Signed)
History of congenital heart disease status post repair of tetralogy of Fallot back in 26s.  Most recent 2D echo performed 11/20/2015 revealed normal LV size and function, mild pulmonary hypertension with no evidence of VSD.

## 2019-12-08 NOTE — Patient Instructions (Signed)
Medication Instructions:  INCREASE the Pravastatin to 20 mg once daily  *If you need a refill on your cardiac medications before your next appointment, please call your pharmacy*   Lab Work: Your provider would like for you to return in 2 months to have the following labs drawn: fasting lipid and liver. You do not need an appointment for the lab. Once in our office lobby there is a podium where you can sign in and ring the doorbell to alert [place] that you are here. The lab is open from 8:00 am to 4:30 pm; closed for lunch from 12:45pm-1:45pm.  If you have labs (blood work) drawn today and your tests are completely normal, you will receive your results only by: Marland Kitchen MyChart Message (if you have MyChart) OR . A paper copy in the mail If you have any lab test that is abnormal or we need to change your treatment, we will call you to review the results.   Testing/Procedures: None ordered   Follow-Up: At [facility], you and your health needs are our priority.  As part of our continuing mission to provide you with exceptional heart care, we have created designated Provider Care Teams.  These Care Teams include your primary Cardiologist (physician) and Advanced Practice Providers (APPs -  Physician Assistants and Nurse Practitioners) who all work together to provide you with the care you need, when you need it.  We recommend signing up for the patient portal called "MyChart".  Sign up information is provided on this After Visit Summary.  MyChart is used to connect with patients for Virtual Visits (Telemedicine).  Patients are able to view lab/test results, encounter notes, upcoming appointments, etc.  Non-urgent messages can be sent to your provider as well.   To learn more about what you can do with MyChart, go to [email or web address].    Your next appointment:   12 month(s)  The format for your next appointment:   In Person  Provider:   You may see Quay Burow, MD or one of the  following Advanced Practice Providers on your designated Care Team:    Kerin Ransom, PA-C  [place], [place]  Coletta Memos, [place]

## 2019-12-08 NOTE — Assessment & Plan Note (Signed)
History of hyperlipidemia on low-dose Pravachol with lipid profile performed 11/02/2019 revealing total cholesterol 216, LDL 120 and HDL of 63.

## 2020-02-02 ENCOUNTER — Ambulatory Visit (INDEPENDENT_AMBULATORY_CARE_PROVIDER_SITE_OTHER): Payer: Managed Care, Other (non HMO)

## 2020-02-02 DIAGNOSIS — Z23 Encounter for immunization: Secondary | ICD-10-CM

## 2020-02-02 NOTE — Progress Notes (Signed)
   Covid-19 Vaccination Clinic  Name:  YITZEL SHASTEEN    MRN: [id number] DOB: 1965-10-19  02/02/2020  Ms. Morel was observed post Covid-19 immunization for 15 minutes without incident. She was provided with Vaccine Information Sheet and instruction to access the V-Safe system.   Ms. Willenbring was instructed to call 911 with any severe reactions post vaccine: Marland Kitchen Difficulty breathing  . Swelling of face and throat  . A fast heartbeat  . A bad rash all over body  . Dizziness and weakness   Immunizations Administered    No immunizations on file.

## 2020-02-13 ENCOUNTER — Other Ambulatory Visit: Payer: Self-pay

## 2020-02-13 MED ORDER — PRAVASTATIN SODIUM 20 MG PO TABS: 20.0000 mg | ORAL_TABLET | Freq: Every day | ORAL | 1 refills | Status: DC

## 2020-02-26 ENCOUNTER — Other Ambulatory Visit: Payer: Self-pay | Admitting: Nurse Practitioner

## 2020-03-06 ENCOUNTER — Encounter: Payer: Self-pay | Admitting: Nurse Practitioner

## 2020-03-06 ENCOUNTER — Other Ambulatory Visit: Payer: Self-pay

## 2020-03-06 ENCOUNTER — Ambulatory Visit (INDEPENDENT_AMBULATORY_CARE_PROVIDER_SITE_OTHER): Payer: Managed Care, Other (non HMO) | Admitting: Nurse Practitioner

## 2020-03-06 VITALS — BP 100/60 | HR 65 | Temp 98.2°F | Ht 60.8 in | Wt 171.2 lb

## 2020-03-06 DIAGNOSIS — I959 Hypotension, unspecified: Secondary | ICD-10-CM

## 2020-03-06 DIAGNOSIS — R5383 Other fatigue: Secondary | ICD-10-CM

## 2020-03-06 NOTE — Patient Instructions (Addendum)

## 2020-03-06 NOTE — Progress Notes (Signed)
Rutherford Nail as a scribe for [name], FNP.,have documented all relevant documentation on the behalf of [name], FNP,as directed by  [name], FNP while in the presence of [name], [place]. This visit occurred during the SARS-CoV-2 public health emergency.  Safety protocols were in place, including screening questions prior to the visit, additional usage of staff PPE, and extensive cleaning of exam room while observing appropriate contact time as indicated for disinfecting solutions.  Subjective:     Patient ID: Stephanie Garrison , female    DOB: 02-19-1966 , 55 y.o.   MRN: [id number]   Chief Complaint  Patient presents with  . Fatigue  . Hypertension    HPI   Pt is here today for b/p check and she has been feeling sluggish. She was at work feeling bad, she drank 2 cups of tea from [employer].  When she got to work started feeling bad.  She had blurred vision, sluggish, lightheaded and off balance and a slight headache.   Wt Readings from Last 3 Encounters: 03/06/20 : 171 lb 3.2 oz (77.7 kg) 12/08/19 : 164 lb (74.4 kg) 11/02/19 : 167 lb (75.8 kg)     Past Medical History:  Diagnosis Date  . GERD (gastroesophageal reflux disease)    OTC  . Hypertension   . Hypothyroidism   . RBBB (right bundle branch block)    recurrent, asymptomatic  . S/P CABG (coronary artery bypass graft) 1964   when she was 55 years old  . S/P repair of tetralogy of Fallot    2D ECHO, 10/10/2009 - EF >55%, normal to mild  . Thyroid disease    hypothyroidism     Family History  Problem Relation Age of Onset  . Hyperlipidemia Mother      Current Outpatient Medications:  .  amLODipine (NORVASC) 5 MG tablet, TAKE 1 TABLET DAILY, Disp: 90 tablet, Rfl: 3 .  levothyroxine (SYNTHROID) 50 MCG tablet, TAKE 1 TABLET DAILY, Disp: 90 tablet, Rfl: 3 .  olmesartan-hydrochlorothiazide (BENICAR HCT) 40-25 MG tablet, TAKE 1 TABLET DAILY (NEED AN APPOINTMENT), Disp: 90 tablet, Rfl: 3 .   pravastatin (PRAVACHOL) 20 MG tablet, Take 1 tablet (20 mg total) by mouth daily., Disp: 90 tablet, Rfl: 1 .  Vitamin D, Ergocalciferol, (DRISDOL) 1.25 MG (50000 UNIT) CAPS capsule, TAKE 1 CAPSULE EVERY 7 DAYS, Disp: 12 capsule, Rfl: 3   No Known Allergies   Review of Systems  Constitutional: Negative.  Negative for fatigue.  HENT: Negative.   Respiratory: Negative.   Cardiovascular: Negative.  Negative for chest pain, palpitations and leg swelling.  Endocrine: Negative for polydipsia, polyphagia and polyuria.  Musculoskeletal: Negative.   Skin: Negative.   Neurological: Negative for dizziness and headaches.  Psychiatric/Behavioral: Negative.      Today's Vitals   03/06/20 0851  BP: 100/60  Pulse: 65  Temp: 98.2 F (36.8 C)  TempSrc: Oral  Weight: 171 lb 3.2 oz (77.7 kg)  Height: 5' 0.8" (1.544 m)  PainSc: 0-No pain   Body mass index is 32.56 kg/m.   Objective:  Physical Exam Constitutional:      General: She is not in acute distress.    Appearance: Normal appearance. She is obese.  Cardiovascular:     Rate and Rhythm: Normal rate and regular rhythm.     Pulses: Normal pulses.     Heart sounds: Normal heart sounds. No murmur heard.   Pulmonary:     Effort: Pulmonary effort is normal. No respiratory distress.  Breath sounds: Normal breath sounds. No wheezing.  Skin:    General: Skin is warm and dry.  Neurological:     General: No focal deficit present.     Mental Status: She is alert and oriented to person, place, and time.     Cranial Nerves: No cranial nerve deficit.  Psychiatric:        Mood and Affect: Mood normal.        Behavior: Behavior normal.        Thought Content: Thought content normal.        Judgment: Judgment normal.         Assessment And Plan:     1. Fatigue, unspecified type  She is feeling better today, may be due to low blood pressure today is 100/60. She denies any other symptoms related to covid such as diarrhea, shortness of  breath, cough, or congestion  2. Hypotension, unspecified hypotension type  Blood pressure is low but does not have any current symptoms of lightheaded or dizziness  She is to check her blood pressure daily and keep a log to bring with her to her appt in 3 weeks.   We may need to decrease her blood pressure medications      Patient was given opportunity to ask questions. Patient verbalized understanding of the plan and was able to repeat key elements of the plan. All questions were answered to their satisfaction.  [name], FNP   I, [name], FNP, have reviewed all documentation for this visit. The documentation on 03/06/20 for the exam, diagnosis, procedures, and orders are all accurate and complete.   THE PATIENT IS ENCOURAGED TO PRACTICE SOCIAL DISTANCING DUE TO THE COVID-19 PANDEMIC.

## 2020-03-28 ENCOUNTER — Encounter: Payer: Managed Care, Other (non HMO) | Admitting: Nurse Practitioner

## 2020-04-10 ENCOUNTER — Ambulatory Visit: Payer: Managed Care, Other (non HMO) | Admitting: Nurse Practitioner

## 2020-04-10 ENCOUNTER — Other Ambulatory Visit (HOSPITAL_COMMUNITY)
Admission: RE | Admit: 2020-04-10 | Discharge: 2020-04-10 | Disposition: A | Discharge status: Home / Self Care | Payer: Managed Care, Other (non HMO) | Source: Ambulatory Visit | Attending: Nurse Practitioner | Admitting: Nurse Practitioner

## 2020-04-10 ENCOUNTER — Encounter: Payer: Self-pay | Admitting: Nurse Practitioner

## 2020-04-10 ENCOUNTER — Other Ambulatory Visit: Payer: Self-pay

## 2020-04-10 VITALS — BP 112/70 | HR 83 | Temp 99.0°F | Ht 60.8 in | Wt 169.6 lb

## 2020-04-10 DIAGNOSIS — M1610 Unilateral primary osteoarthritis, unspecified hip: Secondary | ICD-10-CM

## 2020-04-10 DIAGNOSIS — Z124 Encounter for screening for malignant neoplasm of cervix: Secondary | ICD-10-CM | POA: Diagnosis present

## 2020-04-10 DIAGNOSIS — E782 Mixed hyperlipidemia: Secondary | ICD-10-CM

## 2020-04-10 DIAGNOSIS — E559 Vitamin D deficiency, unspecified: Secondary | ICD-10-CM

## 2020-04-10 DIAGNOSIS — M79604 Pain in right leg: Secondary | ICD-10-CM

## 2020-04-10 DIAGNOSIS — Z0000 Encounter for general adult medical examination without abnormal findings: Secondary | ICD-10-CM

## 2020-04-10 DIAGNOSIS — I10 Essential (primary) hypertension: Secondary | ICD-10-CM | POA: Diagnosis not present

## 2020-04-10 DIAGNOSIS — M79605 Pain in left leg: Secondary | ICD-10-CM

## 2020-04-10 DIAGNOSIS — R7309 Other abnormal glucose: Secondary | ICD-10-CM

## 2020-04-10 LAB — POCT URINALYSIS DIPSTICK
Bilirubin, UA: NEGATIVE
Glucose, UA: NEGATIVE
Ketones, UA: NEGATIVE
Leukocytes, UA: NEGATIVE
Nitrite, UA: NEGATIVE
Protein, UA: NEGATIVE
Spec Grav, UA: >=1.03 — AB (ref 1.010–1.025)
Urobilinogen, UA: 0.2 E.U./dL
pH, UA: 5.5 (ref 5.0–8.0)

## 2020-04-10 LAB — POCT UA - MICROALBUMIN
Albumin/Creatinine Ratio, Urine, POC: 30
Creatinine, POC: 200 mg/dL
Microalbumin Ur, POC: 10 mg/L

## 2020-04-10 NOTE — Progress Notes (Signed)
Rutherford Nail as a scribe for [name], FNP.,have documented all relevant documentation on the behalf of [name], FNP,as directed by  [name], FNP while in the presence of [name], [place]. This visit occurred during the SARS-CoV-2 public health emergency.  Safety protocols were in place, including screening questions prior to the visit, additional usage of staff PPE, and extensive cleaning of exam room while observing appropriate contact time as indicated for disinfecting solutions.  Subjective:     Patient ID: Stephanie Garrison , female    DOB: 12/08/1965 , 55 y.o.   MRN: [id number]   Chief Complaint  Patient presents with  . Annual Exam    HPI  Here for HM last pap sometime last year [facility]. She has been having leg pain in both legs   Thyroid Problem Presents for follow-up visit. Patient reports no anxiety, fatigue or palpitations.  Hypertension This is a chronic problem. The current episode started more than 1 year ago. The problem is unchanged. The problem is controlled. Pertinent negatives include no anxiety, chest pain, headaches or palpitations. There are no associated agents to hypertension. Risk factors for coronary artery disease include sedentary lifestyle. Past treatments include angiotensin blockers and diuretics. There are no compliance problems.  Identifiable causes of hypertension include a thyroid problem.  Otalgia  There is pain in the right ear. This is a new problem. The current episode started more than 1 month ago. The problem occurs every few hours. There has been no fever. Pertinent negatives include no abdominal pain, ear discharge or headaches. Associated symptoms comments: Seen by dentist who felt swollen gland on the right. She has tried nothing for the symptoms. The treatment provided no relief.  Hip Pain  The incident occurred more than 1 week ago. There was no injury mechanism. The pain is  present in the right hip. The quality of the pain is described as aching. The pain is moderate. The pain has been worsening since onset. Pertinent negatives include no inability to bear weight or tingling. The symptoms are aggravated by movement. She has tried acetaminophen for the symptoms. The treatment provided mild relief.     Past Medical History:  Diagnosis Date  . GERD (gastroesophageal reflux disease)    OTC  . Hypertension   . Hypothyroidism   . RBBB (right bundle branch block)    recurrent, asymptomatic  . S/P CABG (coronary artery bypass graft) 1964   when she was 55 years old  . S/P repair of tetralogy of Fallot    2D ECHO, 10/10/2009 - EF >55%, normal to mild  . Thyroid disease    hypothyroidism     Family History  Problem Relation Age of Onset  . Hyperlipidemia Mother      Current Outpatient Medications:  .  amLODipine (NORVASC) 5 MG tablet, TAKE 1 TABLET DAILY, Disp: 90 tablet, Rfl: 3 .  levothyroxine (SYNTHROID) 50 MCG tablet, TAKE 1 TABLET DAILY, Disp: 90 tablet, Rfl: 3 .  olmesartan-hydrochlorothiazide (BENICAR HCT) 40-25 MG tablet, TAKE 1 TABLET DAILY (NEED AN APPOINTMENT), Disp: 90 tablet, Rfl: 3 .  pravastatin (PRAVACHOL) 20 MG tablet, Take 1 tablet (20 mg total) by mouth daily., Disp: 90 tablet, Rfl: 1 .  Vitamin D, Ergocalciferol, (DRISDOL) 1.25 MG (50000 UNIT) CAPS capsule, TAKE 1 CAPSULE EVERY 7 DAYS, Disp: 12 capsule, Rfl: 3   No Known Allergies    The patient states she uses tubal ligation for birth control.  Patient's last menstrual period was 03/28/2020 (approximate).. Negative for Dysmenorrhea and Negative for Menorrhagia. Negative for: breast discharge, breast lump(s), breast pain and breast self exam. Associated symptoms include abnormal vaginal bleeding. Pertinent negatives include abnormal bleeding (hematology), anxiety, decreased libido, depression, difficulty falling sleep, dyspareunia, history of infertility, nocturia, sexual dysfunction, sleep  disturbances, urinary incontinence, urinary urgency, vaginal discharge and vaginal itching. Diet regular.  The patient states her exercise level is none, she is not exercising. She is reporting her legs are hurting with sharp pains.   The patient's tobacco use is:  Social History   Tobacco Use  Smoking Status Never Smoker  Smokeless Tobacco Never Used   She has been exposed to passive smoke. The patient's alcohol use is:  Social History   Substance and Sexual Activity  Alcohol Use Yes   Comment: 3 drinks tonight or more    Additional information: Last pap 2019, next one scheduled for 2022  Review of Systems  Constitutional: Negative.  Negative for fatigue.  HENT: Negative for ear discharge and ear pain.   Cardiovascular: Positive for leg swelling. Negative for chest pain and palpitations.       Pt stated she is having leg pain in both legs   Gastrointestinal: Negative for abdominal pain.  Endocrine: Negative for polydipsia, polyphagia and polyuria.  Musculoskeletal: Negative.   Skin: Negative.   Neurological: Negative for dizziness, tingling and headaches.  Psychiatric/Behavioral: Negative.  The patient is not nervous/anxious.      Today's Vitals   04/10/20 1108  BP: 112/70  Pulse: 83  Temp: 99 F (37.2 C)  TempSrc: Oral  Weight: 169 lb 9.6 oz (76.9 kg)  Height: 5' 0.8" (1.544 m)   Body mass index is 32.26 kg/m.  Wt Readings from Last 3 Encounters:  04/10/20 169 lb 9.6 oz (76.9 kg)  03/06/20 171 lb 3.2 oz (77.7 kg)  12/08/19 164 lb (74.4 kg)   Objective:  Physical Exam Constitutional:      General: She is not in acute distress.    Appearance: Normal appearance. She is well-developed. She is obese.  HENT:     Head: Normocephalic and atraumatic.     Right Ear: Hearing, tympanic membrane, ear canal and external ear normal. There is no impacted cerumen.     Left Ear: Hearing, tympanic membrane, ear canal and external ear normal. There is no impacted cerumen.      Nose:     Comments: Deferred - masked    Mouth/Throat:     Comments: Deferred - masked Eyes:     General: Lids are normal.     Extraocular Movements: Extraocular movements intact.     Conjunctiva/sclera: Conjunctivae normal.     Pupils: Pupils are equal, round, and reactive to light.     Funduscopic exam:    Right eye: No papilledema.        Left eye: No papilledema.  Neck:     Thyroid: No thyroid mass.     Vascular: No carotid bruit.  Cardiovascular:     Rate and Rhythm: Normal rate and regular rhythm.     Pulses: Normal pulses.     Heart sounds: Normal heart sounds. No murmur heard.   Pulmonary:     Effort: Pulmonary effort is normal.     Breath sounds: Normal breath sounds.  Chest:     Chest wall: No mass.  Breasts:     Tanner Score is 5.     Right: Normal. No mass, tenderness, axillary adenopathy or  supraclavicular adenopathy.     Left: Normal. No mass, tenderness, axillary adenopathy or supraclavicular adenopathy.    Abdominal:     General: Abdomen is flat. Bowel sounds are normal. There is no distension.     Palpations: Abdomen is soft.     Tenderness: There is no abdominal tenderness.  Genitourinary:    Rectum: Guaiac result negative.  Musculoskeletal:        General: No swelling. Normal range of motion.     Cervical back: Full passive range of motion without pain, normal range of motion and neck supple.     Right lower leg: No edema.     Left lower leg: No edema.  Lymphadenopathy:     Upper Body:     Right upper body: No supraclavicular, axillary or pectoral adenopathy.     Left upper body: No supraclavicular, axillary or pectoral adenopathy.  Skin:    General: Skin is warm and dry.     Capillary Refill: Capillary refill takes less than 2 seconds.  Neurological:     General: No focal deficit present.     Mental Status: She is alert and oriented to person, place, and time.     Cranial Nerves: No cranial nerve deficit.     Sensory: No sensory deficit.   Psychiatric:        Mood and Affect: Mood normal.        Behavior: Behavior normal.        Thought Content: Thought content normal.        Judgment: Judgment normal.         Assessment And Plan:     1. Health maintenance examination . Behavior modifications discussed and diet history reviewed.   . Pt will continue to exercise regularly and modify diet with low GI, plant based foods and decrease intake of processed foods.  . Recommend intake of daily multivitamin, Vitamin D, and calcium.  . Recommend mammogram and colonoscopy (up to date) for preventive screenings, as well as recommend immunizations that include influenza, TDAP  2. Essential hypertension  Chronic, well controlled  Continue current medications  EKG done with right bundle branch block, she is also followed by the Cardiologist - POCT Urinalysis Dipstick (81002) - POCT UA - Microalbumin - EKG 12-Lead - CMP14+EGFR  3. Pain in both lower extremities  Chronic, she is encouraged to get more active   4. Hip arthritis She has mild to moderate bilateral hip osteoarthritis  5. Mixed hyperlipidemia  Chronic, controlled  Continue with current medications - Lipid panel - CMP14+EGFR  6. Other abnormal glucose  Chronic, stable  no current medications  Encouraged to limit intake of sugary foods and drinks  Encouraged to increase physical activity to 150 minutes per week as tolerated  7. Vitamin D deficiency  Will check vitamin D level and supplement as needed.     Also encouraged to spend 15 minutes in the sun daily.  - Hemoglobin A1c  8. Encounter for Papanicolaou smear of cervix  No abnormal findings on physical exam  If normal next due in 3 years - Cytology -Pap Smear     Patient was given opportunity to ask questions. Patient verbalized understanding of the plan and was able to repeat key elements of the plan. All questions were answered to their satisfaction.   [name], FNP   I,  [name], FNP, have reviewed all documentation for this visit. The documentation on 04/10/20 for the exam, diagnosis, procedures, and orders are all accurate  and complete.   THE PATIENT IS ENCOURAGED TO PRACTICE SOCIAL DISTANCING DUE TO THE COVID-19 PANDEMIC.

## 2020-04-10 NOTE — Patient Instructions (Signed)
Hypertension, Adult Hypertension is another name for high blood pressure. High blood pressure forces your heart to work harder to pump blood. This can cause problems over time. There are two numbers in a blood pressure reading. There is a top number (systolic) over a bottom number (diastolic). It is best to have a blood pressure that is below 120/80. Healthy choices can help lower your blood pressure, or you may need medicine to help lower it. What are the causes? The cause of this condition is not known. Some conditions may be related to high blood pressure. What increases the risk?  Smoking.  Having type 2 diabetes mellitus, high cholesterol, or both.  Not getting enough exercise or physical activity.  Being overweight.  Having too much fat, sugar, calories, or salt (sodium) in your diet.  Drinking too much alcohol.  Having long-term (chronic) kidney disease.  Having a family history of high blood pressure.  Age. Risk increases with age.  Race. You may be at higher risk if you are African American.  Gender. Men are at higher risk than women before age 45. After age 65, women are at higher risk than men.  Having obstructive sleep apnea.  Stress. What are the signs or symptoms?  High blood pressure may not cause symptoms. Very high blood pressure (hypertensive crisis) may cause: ? Headache. ? Feelings of worry or nervousness (anxiety). ? Shortness of breath. ? Nosebleed. ? A feeling of being sick to your stomach (nausea). ? Throwing up (vomiting). ? Changes in how you see. ? Very bad chest pain. ? Seizures. How is this treated?  This condition is treated by making healthy lifestyle changes, such as: ? Eating healthy foods. ? Exercising more. ? Drinking less alcohol.  Your health care provider may prescribe medicine if lifestyle changes are not enough to get your blood pressure under control, and if: ? Your top number is above 130. ? Your bottom number is above  80.  Your personal target blood pressure may vary. Follow these instructions at home: Eating and drinking  If told, follow the DASH eating plan. To follow this plan: ? Fill one half of your plate at each meal with fruits and vegetables. ? Fill one fourth of your plate at each meal with whole grains. Whole grains include whole-wheat pasta, brown rice, and whole-grain bread. ? Eat or drink low-fat dairy products, such as skim milk or low-fat yogurt. ? Fill one fourth of your plate at each meal with low-fat (lean) proteins. Low-fat proteins include fish, chicken without skin, eggs, beans, and tofu. ? Avoid fatty meat, cured and processed meat, or chicken with skin. ? Avoid pre-made or processed food.  Eat less than 1,500 mg of salt each day.  Do not drink alcohol if: ? Your doctor tells you not to drink. ? You are pregnant, may be pregnant, or are planning to become pregnant.  If you drink alcohol: ? Limit how much you use to:  0-1 drink a day for women.  0-2 drinks a day for men. ? Be aware of how much alcohol is in your drink. In the [place] one drink equals one 12 oz bottle of beer (355 mL), one 5 oz glass of wine (148 mL), or one 1 oz glass of hard liquor (44 mL).   Lifestyle  Work with your doctor to stay at a healthy weight or to lose weight. Ask your doctor what the best weight is for you.  Get at least 30 minutes of exercise most   days of the week. This may include walking, swimming, or biking.  Get at least 30 minutes of exercise that strengthens your muscles (resistance exercise) at least 3 days a week. This may include lifting weights or doing Pilates.  Do not use any products that contain nicotine or tobacco, such as cigarettes, e-cigarettes, and chewing tobacco. If you need help quitting, ask your doctor.  Check your blood pressure at home as told by your doctor.  Keep all follow-up visits as told by your doctor. This is important.   Medicines  Take over-the-counter  and prescription medicines only as told by your doctor. Follow directions carefully.  Do not skip doses of blood pressure medicine. The medicine does not work as well if you skip doses. Skipping doses also puts you at risk for problems.  Ask your doctor about side effects or reactions to medicines that you should watch for. Contact a doctor if you:  Think you are having a reaction to the medicine you are taking.  Have headaches that keep coming back (recurring).  Feel dizzy.  Have swelling in your ankles.  Have trouble with your vision. Get help right away if you:  Get a very bad headache.  Start to feel mixed up (confused).  Feel weak or numb.  Feel faint.  Have very bad pain in your: ? Chest. ? Belly (abdomen).  Throw up more than once.  Have trouble breathing. Summary  Hypertension is another name for high blood pressure.  High blood pressure forces your heart to work harder to pump blood.  For most people, a normal blood pressure is less than 120/80.  Making healthy choices can help lower blood pressure. If your blood pressure does not get lower with healthy choices, you may need to take medicine. This information is not intended to replace advice given to you by your health care provider. Make sure you discuss any questions you have with your health care provider. Document Revised: 10/20/2017 Document Reviewed: 10/20/2017 Elsevier Patient Education  2021 [employer] Health Maintenance, Female Adopting a healthy lifestyle and getting preventive care are important in promoting health and wellness. Ask your health care provider about:  The right schedule for you to have regular tests and exams.  Things you can do on your own to prevent diseases and keep yourself healthy. What should I know about diet, weight, and exercise? Eat a healthy diet  Eat a diet that includes plenty of vegetables, fruits, low-fat dairy products, and lean protein.  Do not eat a lot  of foods that are high in solid fats, added sugars, or sodium.   Maintain a healthy weight Body mass index (BMI) is used to identify weight problems. It estimates body fat based on height and weight. Your health care provider can help determine your BMI and help you achieve or maintain a healthy weight. Get regular exercise Get regular exercise. This is one of the most important things you can do for your health. Most adults should:  Exercise for at least 150 minutes each week. The exercise should increase your heart rate and make you sweat (moderate-intensity exercise).  Do strengthening exercises at least twice a week. This is in addition to the moderate-intensity exercise.  Spend less time sitting. Even light physical activity can be beneficial. Watch cholesterol and blood lipids Have your blood tested for lipids and cholesterol at 55 years of age, then have this test every 5 years. Have your cholesterol levels checked more often if:  Your lipid or   cholesterol levels are high.  You are older than 55 years of age.  You are at high risk for heart disease. What should I know about cancer screening? Depending on your health history and family history, you may need to have cancer screening at various ages. This may include screening for:  Breast cancer.  Cervical cancer.  Colorectal cancer.  Skin cancer.  Lung cancer. What should I know about heart disease, diabetes, and high blood pressure? Blood pressure and heart disease  High blood pressure causes heart disease and increases the risk of stroke. This is more likely to develop in people who have high blood pressure readings, are of African descent, or are overweight.  Have your blood pressure checked: ? Every 3-5 years if you are 18-39 years of age. ? Every year if you are 40 years old or older. Diabetes Have regular diabetes screenings. This checks your fasting blood sugar level. Have the screening done:  Once every three  years after age 40 if you are at a normal weight and have a low risk for diabetes.  More often and at a younger age if you are overweight or have a high risk for diabetes. What should I know about preventing infection? Hepatitis B If you have a higher risk for hepatitis B, you should be screened for this virus. Talk with your health care provider to find out if you are at risk for hepatitis B infection. Hepatitis C Testing is recommended for:  Everyone born from 1945 through 1965.  Anyone with known risk factors for hepatitis C. Sexually transmitted infections (STIs)  Get screened for STIs, including gonorrhea and chlamydia, if: ? You are sexually active and are younger than 55 years of age. ? You are older than 55 years of age and your health care provider tells you that you are at risk for this type of infection. ? Your sexual activity has changed since you were last screened, and you are at increased risk for chlamydia or gonorrhea. Ask your health care provider if you are at risk.  Ask your health care provider about whether you are at high risk for HIV. Your health care provider may recommend a prescription medicine to help prevent HIV infection. If you choose to take medicine to prevent HIV, you should first get tested for HIV. You should then be tested every 3 months for as long as you are taking the medicine. Pregnancy  If you are about to stop having your period (premenopausal) and you may become pregnant, seek counseling before you get pregnant.  Take 400 to 800 micrograms (mcg) of folic acid every day if you become pregnant.  Ask for birth control (contraception) if you want to prevent pregnancy. Osteoporosis and menopause Osteoporosis is a disease in which the bones lose minerals and strength with aging. This can result in bone fractures. If you are 65 years old or older, or if you are at risk for osteoporosis and fractures, ask your health care provider if you should:  Be  screened for bone loss.  Take a calcium or vitamin D supplement to lower your risk of fractures.  Be given hormone replacement therapy (HRT) to treat symptoms of menopause. Follow these instructions at home: Lifestyle  Do not use any products that contain nicotine or tobacco, such as cigarettes, e-cigarettes, and chewing tobacco. If you need help quitting, ask your health care provider.  Do not use street drugs.  Do not share needles.  Ask your health care provider for   help if you need support or information about quitting drugs. Alcohol use  Do not drink alcohol if: ? Your health care provider tells you not to drink. ? You are pregnant, may be pregnant, or are planning to become pregnant.  If you drink alcohol: ? Limit how much you use to 0-1 drink a day. ? Limit intake if you are breastfeeding.  Be aware of how much alcohol is in your drink. In the [place] one drink equals one 12 oz bottle of beer (355 mL), one 5 oz glass of wine (148 mL), or one 1 oz glass of hard liquor (44 mL). General instructions  Schedule regular health, dental, and eye exams.  Stay current with your vaccines.  Tell your health care provider if: ? You often feel depressed. ? You have ever been abused or do not feel safe at home. Summary  Adopting a healthy lifestyle and getting preventive care are important in promoting health and wellness.  Follow your health care provider's instructions about healthy diet, exercising, and getting tested or screened for diseases.  Follow your health care provider's instructions on monitoring your cholesterol and blood pressure. This information is not intended to replace advice given to you by your health care provider. Make sure you discuss any questions you have with your health care provider. Document Revised: 02/02/2018 Document Reviewed: 02/02/2018 Elsevier Patient Education  2021 [employer] Inc[employer]  

## 2020-04-11 LAB — CMP14+EGFR
ALT: 14 IU/L (ref 0–32)
AST: 21 IU/L (ref 0–40)
Albumin/Globulin Ratio: 1.5 (ref 1.2–2.2)
Albumin: 4.3 g/dL (ref 3.8–4.9)
Alkaline Phosphatase: 65 IU/L (ref 44–121)
BUN/Creatinine Ratio: 26 — ABNORMAL HIGH (ref 9–23)
BUN: 20 mg/dL (ref 6–24)
Bilirubin Total: 0.4 mg/dL (ref 0.0–1.2)
CO2: 24 mmol/L (ref 20–29)
Calcium: 9.4 mg/dL (ref 8.7–10.2)
Chloride: 95 mmol/L — ABNORMAL LOW (ref 96–106)
Creatinine, Ser: 0.78 mg/dL (ref 0.57–1.00)
GFR calc Af Amer: 100 mL/min/{1.73_m2} (ref >59)
GFR calc non Af Amer: 86 mL/min/{1.73_m2} (ref >59)
Globulin, Total: 2.9 g/dL (ref 1.5–4.5)
Glucose: 92 mg/dL (ref 65–99)
Potassium: 3.5 mmol/L (ref 3.5–5.2)
Sodium: 137 mmol/L (ref 134–144)
Total Protein: 7.2 g/dL (ref 6.0–8.5)

## 2020-04-11 LAB — LIPID PANEL
Chol/HDL Ratio: 3 ratio (ref 0.0–4.4)
Cholesterol, Total: 219 mg/dL — ABNORMAL HIGH (ref 100–199)
HDL: 72 mg/dL (ref >39)
LDL Chol Calc (NIH): 106 mg/dL — ABNORMAL HIGH (ref 0–99)
Triglycerides: 240 mg/dL — ABNORMAL HIGH (ref 0–149)
VLDL Cholesterol Cal: 41 mg/dL — ABNORMAL HIGH (ref 5–40)

## 2020-04-11 LAB — HEMOGLOBIN A1C
Est. average glucose Bld gHb Est-mCnc: 123 mg/dL
Hgb A1c MFr Bld: 5.9 % — ABNORMAL HIGH (ref 4.8–5.6)

## 2020-04-11 NOTE — Progress Notes (Signed)
We will recheck in 3 months, if still elevated we may need to increase the dose

## 2020-04-12 LAB — CYTOLOGY - PAP: Diagnosis: NEGATIVE

## 2020-07-04 ENCOUNTER — Other Ambulatory Visit: Payer: Self-pay

## 2020-07-04 ENCOUNTER — Encounter: Payer: Self-pay | Admitting: Nurse Practitioner

## 2020-07-04 ENCOUNTER — Ambulatory Visit: Payer: Managed Care, Other (non HMO) | Admitting: Nurse Practitioner

## 2020-07-04 VITALS — BP 114/70 | HR 80 | Temp 97.9°F | Ht 60.8 in | Wt 175.2 lb

## 2020-07-04 DIAGNOSIS — E559 Vitamin D deficiency, unspecified: Secondary | ICD-10-CM | POA: Diagnosis not present

## 2020-07-04 DIAGNOSIS — E782 Mixed hyperlipidemia: Secondary | ICD-10-CM

## 2020-07-04 DIAGNOSIS — E039 Hypothyroidism, unspecified: Secondary | ICD-10-CM

## 2020-07-04 DIAGNOSIS — R7309 Other abnormal glucose: Secondary | ICD-10-CM

## 2020-07-04 DIAGNOSIS — I10 Essential (primary) hypertension: Secondary | ICD-10-CM

## 2020-07-04 DIAGNOSIS — Z1231 Encounter for screening mammogram for malignant neoplasm of breast: Secondary | ICD-10-CM

## 2020-07-04 DIAGNOSIS — Z6833 Body mass index (BMI) 33.0-33.9, adult: Secondary | ICD-10-CM

## 2020-07-04 NOTE — Patient Instructions (Signed)

## 2020-07-04 NOTE — Progress Notes (Signed)
I,Yamilka Roman [employer] as a Education administrator for [employer], FNP.,have documented all relevant documentation on the behalf of [name], FNP,as directed by  [name], FNP while in the presence of [name], [place].  This visit occurred during the SARS-CoV-2 public health emergency.  Safety protocols were in place, including screening questions prior to the visit, additional usage of staff PPE, and extensive cleaning of exam room while observing appropriate contact time as indicated for disinfecting solutions.  Subjective:     Patient ID: Stephanie Garrison , female    DOB: 1966-02-01 , 55 y.o.   MRN: [id number]   Chief Complaint  Patient presents with  . Hypertension    HPI  Pt here today for hypertension f/u    Wt Readings from Last 3 Encounters: 07/04/20 : 175 lb 3.2 oz (79.5 kg) 04/10/20 : 169 lb 9.6 oz (76.9 kg) 03/06/20 : 171 lb 3.2 oz (77.7 kg)  Hypertension This is a chronic problem. The current episode started more than 1 year ago. The problem is unchanged. The problem is controlled. Pertinent negatives include no chest pain or palpitations. There are no associated agents to hypertension. Risk factors for coronary artery disease include obesity. Past treatments include diuretics and angiotensin blockers. The current treatment provides moderate improvement. Compliance problems include exercise and diet.  There is no history of angina or kidney disease. There is no history of chronic renal disease.     Past Medical History:  Diagnosis Date  . GERD (gastroesophageal reflux disease)    OTC  . Hypertension   . Hypothyroidism   . RBBB (right bundle branch block)    recurrent, asymptomatic  . S/P CABG (coronary artery bypass graft) 1964   when she was 55 years old  . S/P repair of tetralogy of Fallot    2D ECHO, 10/10/2009 - EF >55%, normal to mild  . Thyroid disease    hypothyroidism     Family History  Problem Relation Age of Onset  . Hyperlipidemia Mother       Current Outpatient Medications:  .  amLODipine (NORVASC) 5 MG tablet, TAKE 1 TABLET DAILY, Disp: 90 tablet, Rfl: 3 .  levothyroxine (SYNTHROID) 50 MCG tablet, TAKE 1 TABLET DAILY, Disp: 90 tablet, Rfl: 3 .  olmesartan-hydrochlorothiazide (BENICAR HCT) 40-25 MG tablet, TAKE 1 TABLET DAILY (NEED AN APPOINTMENT), Disp: 90 tablet, Rfl: 3 .  pravastatin (PRAVACHOL) 20 MG tablet, Take 1 tablet (20 mg total) by mouth daily., Disp: 90 tablet, Rfl: 1 .  Vitamin D, Ergocalciferol, (DRISDOL) 1.25 MG (50000 UNIT) CAPS capsule, TAKE 1 CAPSULE EVERY 7 DAYS, Disp: 12 capsule, Rfl: 3   No Known Allergies   Review of Systems  Constitutional: Negative.   HENT: Negative.   Respiratory: Negative.  Negative for cough.   Cardiovascular: Negative.  Negative for chest pain, palpitations and leg swelling.  Musculoskeletal: Negative.   Skin: Negative.   Allergic/Immunologic: Negative.   Neurological: Negative.   Psychiatric/Behavioral: Negative.      Today's Vitals   07/04/20 0844  BP: 114/70  Pulse: 80  Temp: 97.9 F (36.6 C)  Weight: 175 lb 3.2 oz (79.5 kg)  Height: 5' 0.8" (1.544 m)  PainSc: 0-No pain   Body mass index is 33.32 kg/m.   Objective:  Physical Exam Vitals reviewed.  Constitutional:      General: She is not in acute distress.    Appearance: Normal appearance. She is obese.  Cardiovascular:     Rate and Rhythm: Normal rate and regular  rhythm.     Pulses: Normal pulses.     Heart sounds: Murmur heard.    Pulmonary:     Effort: Pulmonary effort is normal. No respiratory distress.     Breath sounds: Normal breath sounds. No wheezing.  Skin:    General: Skin is warm and dry.  Neurological:     General: No focal deficit present.     Mental Status: She is alert and oriented to person, place, and time.     Cranial Nerves: No cranial nerve deficit.  Psychiatric:        Mood and Affect: Mood normal.        Behavior: Behavior normal.        Thought Content: Thought content  normal.        Judgment: Judgment normal.         Assessment And Plan:     1. Essential hypertension  Chronic, excellent control  Continue with current medications - CMP14+EGFR  2. Mixed hyperlipidemia  Chronic, controlled  Continue with current medications, tolerating well  Continue to limit your intake of fried and fatty foods - CMP14+EGFR - Lipid panel  3. Vitamin D deficiency  Will check vitamin D level and supplement as needed.     Also encouraged to spend 15 minutes in the sun daily.   4. Acquired hypothyroidism  Chronic, controlled  Continue with current medications, will make changes to medications pending lab results - T4 - T3, free - TSH  5. Abnormal glucose Chronic, stable No current medications Encouraged to limit intake of sugary foods and drinks Encouraged to increase physical activity to 150 minutes per week - this will help with calorie output - CMP14+EGFR  6. BMI 33.0-33.9,adult  Chronic, has gained approximately 6 lbs since last visit encouraged to increase physical activity and limit intake of carbs  Discussed healthy diet and regular exercise options   7. Encounter for screening mammogram for malignant neoplasm of breast  Pt instructed on Self Breast Exam.According to ACOG guidelines Women aged 61 and older are recommended to get an annual mammogram. Form completed and given to patient contact the [facility] for appointment scheduing.   Pt encouraged to get annual mammogram - MM DIGITAL SCREENING BILATERAL; Future   She would like to wait until her next visit for her Tdap  Patient was given opportunity to ask questions. Patient verbalized understanding of the plan and was able to repeat key elements of the plan. All questions were answered to their satisfaction.  [name], FNP   I, [name], FNP, have reviewed all documentation for this visit. The documentation on 07/04/20 for the exam, diagnosis, procedures, and  orders are all accurate and complete.   IF YOU HAVE BEEN REFERRED TO A SPECIALIST, IT MAY TAKE 1-2 WEEKS TO SCHEDULE/PROCESS THE REFERRAL. IF YOU HAVE NOT HEARD FROM US/SPECIALIST IN TWO WEEKS, PLEASE GIVE [place] A CALL AT [phone].   THE PATIENT IS ENCOURAGED TO PRACTICE SOCIAL DISTANCING DUE TO THE COVID-19 PANDEMIC.

## 2020-07-05 LAB — CMP14+EGFR
ALT: 20 IU/L (ref 0–32)
AST: 23 IU/L (ref 0–40)
Albumin/Globulin Ratio: 1.4 (ref 1.2–2.2)
Albumin: 4.2 g/dL (ref 3.8–4.9)
Alkaline Phosphatase: 66 IU/L (ref 44–121)
BUN/Creatinine Ratio: 36 — ABNORMAL HIGH (ref 9–23)
BUN: 25 mg/dL — ABNORMAL HIGH (ref 6–24)
Bilirubin Total: 0.5 mg/dL (ref 0.0–1.2)
CO2: 27 mmol/L (ref 20–29)
Calcium: 9.7 mg/dL (ref 8.7–10.2)
Chloride: 96 mmol/L (ref 96–106)
Creatinine, Ser: 0.69 mg/dL (ref 0.57–1.00)
Globulin, Total: 3 g/dL (ref 1.5–4.5)
Glucose: 102 mg/dL — ABNORMAL HIGH (ref 65–99)
Potassium: 3.8 mmol/L (ref 3.5–5.2)
Sodium: 139 mmol/L (ref 134–144)
Total Protein: 7.2 g/dL (ref 6.0–8.5)
eGFR: 103 mL/min/{1.73_m2} (ref >59)

## 2020-07-05 LAB — TSH: TSH: 3.96 u[IU]/mL (ref 0.450–4.500)

## 2020-07-05 LAB — T4: T4, Total: 8.1 ug/dL (ref 4.5–12.0)

## 2020-07-05 LAB — LIPID PANEL
Chol/HDL Ratio: 3 ratio (ref 0.0–4.4)
Cholesterol, Total: 223 mg/dL — ABNORMAL HIGH (ref 100–199)
HDL: 74 mg/dL (ref >39)
LDL Chol Calc (NIH): 98 mg/dL (ref 0–99)
Triglycerides: 310 mg/dL — ABNORMAL HIGH (ref 0–149)
VLDL Cholesterol Cal: 51 mg/dL — ABNORMAL HIGH (ref 5–40)

## 2020-07-05 LAB — T3, FREE: T3, Free: 3.2 pg/mL (ref 2.0–4.4)

## 2020-07-15 ENCOUNTER — Other Ambulatory Visit: Payer: Self-pay | Admitting: Nurse Practitioner

## 2020-07-24 ENCOUNTER — Other Ambulatory Visit: Payer: Self-pay | Admitting: Nurse Practitioner

## 2020-08-05 ENCOUNTER — Other Ambulatory Visit: Payer: Self-pay

## 2020-08-05 ENCOUNTER — Ambulatory Visit
Admission: RE | Admit: 2020-08-05 | Discharge: 2020-08-05 | Disposition: A | Discharge status: Home / Self Care | Payer: Managed Care, Other (non HMO) | Source: Ambulatory Visit | Attending: Nurse Practitioner | Admitting: Nurse Practitioner

## 2020-08-05 DIAGNOSIS — Z1231 Encounter for screening mammogram for malignant neoplasm of breast: Secondary | ICD-10-CM

## 2020-08-08 ENCOUNTER — Ambulatory Visit: Payer: Managed Care, Other (non HMO) | Admitting: Nurse Practitioner

## 2020-08-28 ENCOUNTER — Telehealth: Payer: Self-pay | Admitting: Cardiovascular Disease

## 2020-08-28 NOTE — Telephone Encounter (Addendum)
   Patient Name: Stephanie Garrison  DOB: 07/08/1965  MRN: [id number]   Primary Cardiologist: Quay Burow, MD  Chart reviewed as part of pre-operative protocol coverage.   SIMPLE CLEANINGS: Simple dental cleanings are considered low risk procedures per guidelines and generally do not require any specific cardiac clearance. However, SBE prophylaxis is required for the patient from a cardiac standpoint. Dr. Gwenlyn Found has previously recommended Amoxicillin 2000mg  (four 500mg  capsules) 60 minutes prior to scheduled dental cleaning.  I will route this recommendation to the callback team to notify patient/dentist. From the note below, please clarify whether this is something the dentist is providing today prior to cleaning or whether she needs to have it called in. OK to send in with #4 with 1 refill if patient desires.   Charlie Pitter, PA-C 08/28/2020, 9:53 AM

## 2020-08-28 NOTE — Telephone Encounter (Signed)
        [place] Pre-operative Risk Assessment    Patient Name: Stephanie Garrison  DOB: [birth year]  MRN: [id number]   HEARTCARE STAFF: - Please ensure there is not already an duplicate clearance open for this procedure. - Under Visit Info/Reason for Call, type in Other and utilize the format Clearance MM/DD/YY or Clearance TBD. Do not use dashes or single digits. - If request is for dental extraction, please clarify the # of teeth to be extracted. - If the patient is currently at the dentist's office, call Pre-Op APP to address. If the patient is not currently in the dentist office, please route to the Pre-Op pool  Request for surgical clearance:  What type of surgery is being performed? cleaning  When is this surgery scheduled? 08/28/20  What type of clearance is required (medical clearance vs. Pharmacy clearance to hold med vs. Both)? pharmacy  Are there any medications that need to be held prior to surgery and how long? Pt did not take her pre-med and would like to know if the dentist office can just give it to her now  Practice name and name of physician performing surgery? [facility] Dr. Annitta Jersey  What is the office phone number? [phone]   7.   What is the office fax number? [phone]  8.   Anesthesia type (None, local, MAC, general) ? None    Stephanie Garrison 08/28/2020, 9:41 AM  _________________________________________________________________   (provider comments below)

## 2020-08-28 NOTE — Telephone Encounter (Signed)
Spoke with Larene Beach at Dentist office, pt is there and has the medication with her, she just forgot to take it 1 hour prior.  Larene Beach was advised that pt needed to take it then wait 1 hour for her cleaning.  She stated, "that's all we needed to know, thank you!'

## 2020-09-02 ENCOUNTER — Other Ambulatory Visit: Payer: Self-pay | Admitting: Nurse Practitioner

## 2020-10-02 ENCOUNTER — Ambulatory Visit: Payer: Managed Care, Other (non HMO) | Admitting: Nurse Practitioner

## 2020-10-02 ENCOUNTER — Encounter: Payer: Self-pay | Admitting: Nurse Practitioner

## 2020-10-02 ENCOUNTER — Other Ambulatory Visit: Payer: Self-pay

## 2020-10-02 VITALS — BP 138/72 | HR 70 | Temp 97.6°F | Ht 60.8 in | Wt 178.4 lb

## 2020-10-02 DIAGNOSIS — R7309 Other abnormal glucose: Secondary | ICD-10-CM

## 2020-10-02 DIAGNOSIS — Z6833 Body mass index (BMI) 33.0-33.9, adult: Secondary | ICD-10-CM

## 2020-10-02 DIAGNOSIS — M79675 Pain in left toe(s): Secondary | ICD-10-CM | POA: Diagnosis not present

## 2020-10-02 DIAGNOSIS — E782 Mixed hyperlipidemia: Secondary | ICD-10-CM | POA: Diagnosis not present

## 2020-10-02 DIAGNOSIS — E6609 Other obesity due to excess calories: Secondary | ICD-10-CM

## 2020-10-02 DIAGNOSIS — I10 Essential (primary) hypertension: Secondary | ICD-10-CM | POA: Diagnosis not present

## 2020-10-02 NOTE — Patient Instructions (Signed)
How to Buddy Tape Buddy taping refers to taping an injured finger or toe to an uninjured finger or toe that is next to it. This protects the injured finger or toe and reducesmovement while the injury heals. You may buddy tape a finger or toe if you have a minor sprain. Your health care provider may buddy tape your finger or toe if you have a sprain, dislocation,or fracture. You may be told to replace your buddy taping as needed. What are the risks? Generally, buddy taping is safe. However, problems may occur, such as: Skin injury or infection. Reduced blood flow to the finger or toe. Skin reaction to the tape. Do not buddy tape your toe if you have diabetes. Do not buddy tape if you know that you have an allergy to adhesives or surgical tape. Supplies needed: Gauze pad, cotton, or cloth. Tape. This may be called first-aid tape, surgical tape, or medical tape. How to buddy tape Before buddy taping Lessen any pain and swelling with rest, icing, and elevation: Avoid activities that cause pain. If directed, put ice on the injured area. To do this: Put ice in a plastic bag. Place a towel between your skin and the bag. Leave the ice on for 20 minutes, 2-3 times a day. Remove the ice if your skin turns bright red. This is very important. If you cannot feel pain, heat, or cold, you have a greater risk of damage to the area. Raise (elevate) your hand or foot above the level of your heart while you are sitting or lying down.  Buddy taping procedure  Clean and dry your finger or toe as told by your health care provider. Place a gauze pad or a piece of cloth or cotton between your injured finger or toe and the uninjured finger or toe. Use tape to wrap around both fingers or toes so your injured finger or toe is secured to the uninjured finger or toe. The tape should be snug, but not tight. Make sure the ends of the piece of tape overlap. Avoid placing tape directly over the joint. Change the tape  and the padding as told by your health care provider. Remove and replace the tape or padding if it becomes loose, worn, dirty, or wet.  After buddy taping Watch the buddy-taped area and always remove buddy taping if: Your pain gets worse. Your fingers or toes turn pale or blue. Your skin becomes irritated. Follow these instructions at home: Take over-the-counter and prescription medicines only as told by your health care provider. Return to your normal activities as told by your health care provider. Ask your health care provider what activities are safe for you. Contact a health care provider if: You have pain, swelling, or bruising that lasts longer than 3 days. You have a fever. Your skin is red, cracked, or irritated. Get help right away if: The injured area becomes cold, numb, or pale. You have severe pain, swelling, bruising, or loss of movement in your finger or toe. Your finger or toe changes shape (deformity). Summary Buddy taping refers to taping an injured finger or toe to an uninjured finger or toe that is next to it. You may buddy tape a finger or toe if you have a minor sprain or fracture. Take over-the-counter and prescription medicines only as told by your health care provider. This information is not intended to replace advice given to you by your health care provider. Make sure you discuss any questions you have with your healthcare  provider. Document Revised: 11/30/2019 Document Reviewed: 11/30/2019 Elsevier Patient Education  2022 [employer].

## 2020-10-02 NOTE — Progress Notes (Signed)
I,Tianna Badgett,acting as a Education administrator for [employer], FNP.,have documented all relevant documentation on the behalf of [name], FNP,as directed by  [name], FNP while in the presence of [name], [place].  This visit occurred during the SARS-CoV-2 public health emergency.  Safety protocols were in place, including screening questions prior to the visit, additional usage of staff PPE, and extensive cleaning of exam room while observing appropriate contact time as indicated for disinfecting solutions.  Subjective:     Patient ID: Stephanie Garrison , female    DOB: 03/28/1965 , 55 y.o.   MRN: [id number]   Chief Complaint  Patient presents with   Toe Injury    HPI  Patient is here for toe pain after stumping her toe on the end of a recliner chair on Saturday. She was unable to put on hre steel toe shoes for work yesterday. Took tylenol without relief.  She states that she stumped her toe on Saturday and the pain has not gone away.     Past Medical History:  Diagnosis Date   GERD (gastroesophageal reflux disease)    OTC   Hypertension    Hypothyroidism    RBBB (right bundle branch block)    recurrent, asymptomatic   S/P CABG (coronary artery bypass graft) 1964   when she was 55 years old   S/P repair of tetralogy of Fallot    2D ECHO, 10/10/2009 - EF >55%, normal to mild   Thyroid disease    hypothyroidism     Family History  Problem Relation Age of Onset   Hyperlipidemia Mother      Current Outpatient Medications:    amLODipine (NORVASC) 5 MG tablet, TAKE 1 TABLET DAILY, Disp: 90 tablet, Rfl: 3   levothyroxine (SYNTHROID) 50 MCG tablet, TAKE 1 TABLET DAILY, Disp: 90 tablet, Rfl: 3   olmesartan-hydrochlorothiazide (BENICAR HCT) 40-25 MG tablet, TAKE 1 TABLET DAILY (NEED AN APPOINTMENT), Disp: 90 tablet, Rfl: 3   pravastatin (PRAVACHOL) 20 MG tablet, TAKE 1 TABLET DAILY, Disp: 90 tablet, Rfl: 3   Vitamin D, Ergocalciferol, (DRISDOL) 1.25 MG (50000 UNIT) CAPS capsule,  TAKE 1 CAPSULE EVERY 7 DAYS, Disp: 12 capsule, Rfl: 3   No Known Allergies   Review of Systems  Constitutional: Negative.   Respiratory: Negative.    Cardiovascular: Negative.   Musculoskeletal:        Left toe pain  Neurological:  Negative for dizziness and headaches.  Psychiatric/Behavioral: Negative.      Today's Vitals   10/02/20 1222  BP: 138/72  Pulse: 70  Temp: 97.6 F (36.4 C)  TempSrc: Oral  Weight: 178 lb 6.4 oz (80.9 kg)  Height: 5' 0.8" (1.544 m)  PainSc: 4    Body mass index is 33.93 kg/m.   Objective:  Physical Exam Vitals reviewed.  Constitutional:      Appearance: Normal appearance.  Cardiovascular:     Rate and Rhythm: Normal rate and regular rhythm.     Pulses: Normal pulses.     Heart sounds: Normal heart sounds. No murmur heard. Pulmonary:     Effort: Pulmonary effort is normal. No respiratory distress.     Breath sounds: Normal breath sounds. No wheezing.  Musculoskeletal:        General: Tenderness (left 4th metatarsal tenderness) present.  Neurological:     General: No focal deficit present.     Mental Status: She is alert and oriented to person, place, and time.     Cranial Nerves: No cranial nerve  deficit.     Motor: No weakness.  Psychiatric:        Mood and Affect: Mood normal.        Behavior: Behavior normal.        Thought Content: Thought content normal.        Judgment: Judgment normal.        Assessment And Plan:     1. Pain of toe of left foot Comments: Buddy tape to her 3rd and 4th left toe Tylenol/Ibuprofen for pain - DG Foot Complete Left; Future  2. Essential hypertension Comments: Good control, continue current medications - BMP8+eGFR  3. Mixed hyperlipidemia Comments: Cholesterol levels were increased at last visit Advised to avoid fried and fatty foods - Lipid panel  4. Abnormal glucose Comments: Stable, no curren medications Diet controlled - Hemoglobin A1c  5. Class 1 obesity due to excess calories  with serious comorbidity and body mass index (BMI) of 33.0 to 33.9 in adult Chronic Discussed healthy diet and regular exercise options  Encouraged to exercise at least 150 minutes per week with 2 days of strength training   Patient was given opportunity to ask questions. Patient verbalized understanding of the plan and was able to repeat key elements of the plan. All questions were answered to their satisfaction.  [name], FNP   I, [name], FNP, have reviewed all documentation for this visit. The documentation on 10/02/20 for the exam, diagnosis, procedures, and orders are all accurate and complete.   IF YOU HAVE BEEN REFERRED TO A SPECIALIST, IT MAY TAKE 1-2 WEEKS TO SCHEDULE/PROCESS THE REFERRAL. IF YOU HAVE NOT HEARD FROM US/SPECIALIST IN TWO WEEKS, PLEASE GIVE [place] A CALL AT [phone].   THE PATIENT IS ENCOURAGED TO PRACTICE SOCIAL DISTANCING DUE TO THE COVID-19 PANDEMIC.

## 2020-10-03 LAB — LIPID PANEL
Chol/HDL Ratio: 3.1 ratio (ref 0.0–4.4)
Cholesterol, Total: 216 mg/dL — ABNORMAL HIGH (ref 100–199)
HDL: 69 mg/dL (ref >39)
LDL Chol Calc (NIH): 112 mg/dL — ABNORMAL HIGH (ref 0–99)
Triglycerides: 207 mg/dL — ABNORMAL HIGH (ref 0–149)
VLDL Cholesterol Cal: 35 mg/dL (ref 5–40)

## 2020-10-03 LAB — BMP8+EGFR
BUN/Creatinine Ratio: 30 — ABNORMAL HIGH (ref 9–23)
BUN: 21 mg/dL (ref 6–24)
CO2: 21 mmol/L (ref 20–29)
Calcium: 9.5 mg/dL (ref 8.7–10.2)
Chloride: 99 mmol/L (ref 96–106)
Creatinine, Ser: 0.7 mg/dL (ref 0.57–1.00)
Glucose: 97 mg/dL (ref 65–99)
Potassium: 3.9 mmol/L (ref 3.5–5.2)
Sodium: 139 mmol/L (ref 134–144)
eGFR: 103 mL/min/{1.73_m2} (ref >59)

## 2020-10-03 LAB — HEMOGLOBIN A1C
Est. average glucose Bld gHb Est-mCnc: 137 mg/dL
Hgb A1c MFr Bld: 6.4 % — ABNORMAL HIGH (ref 4.8–5.6)

## 2020-10-31 ENCOUNTER — Ambulatory Visit: Payer: Managed Care, Other (non HMO) | Admitting: Nurse Practitioner

## 2020-12-10 ENCOUNTER — Telehealth: Payer: Self-pay | Admitting: Cardiovascular Disease

## 2020-12-10 NOTE — Telephone Encounter (Signed)
Pt c/o Shortness Of Breath: STAT if SOB developed within the last 24 hours or pt is noticeably SOB on the phone  1. Are you currently SOB (can you hear that pt is SOB on the phone)? no  2. How long have you been experiencing SOB? 3-4 days  3. Are you SOB when sitting or when up moving around? both  4. Are you currently experiencing any other symptoms? Patient has heart murmur feel like it is working overtime

## 2020-12-10 NOTE — Telephone Encounter (Signed)
Called patient, she states she has noticed some SOB the last 3-4 days. She states no other symptoms (no chest pains, swelling/weight gain) she states she does feel her heart beating hard. The SOB comes and goes with and without activity. She denies any medication changes, patient states her bp is fine (but does not have readings to give). Patient was suppose to follow up in 12 months per last visit with Dr.Berry. I did make an appointment with Almyra Deforest, PA next Friday for year follow up and to review SOB episodes. Last ECHO done in 2017. Patient was advised to monitor weight daily to make sure of no weight gain. Patient verbalized understanding, will route to MD to make aware and give any further recommendations.  Thanks!

## 2020-12-20 ENCOUNTER — Ambulatory Visit: Payer: Managed Care, Other (non HMO) | Admitting: Physician Assistant

## 2020-12-20 ENCOUNTER — Other Ambulatory Visit: Payer: Self-pay | Admitting: Physician Assistant

## 2020-12-20 ENCOUNTER — Ambulatory Visit (INDEPENDENT_AMBULATORY_CARE_PROVIDER_SITE_OTHER): Payer: Managed Care, Other (non HMO)

## 2020-12-20 ENCOUNTER — Other Ambulatory Visit: Payer: Self-pay

## 2020-12-20 ENCOUNTER — Encounter: Payer: Self-pay | Admitting: Physician Assistant

## 2020-12-20 ENCOUNTER — Telehealth: Payer: Self-pay | Admitting: Cardiovascular Disease

## 2020-12-20 VITALS — BP 132/84 | HR 68 | Ht 62.0 in | Wt 178.4 lb

## 2020-12-20 DIAGNOSIS — R7303 Prediabetes: Secondary | ICD-10-CM

## 2020-12-20 DIAGNOSIS — E039 Hypothyroidism, unspecified: Secondary | ICD-10-CM

## 2020-12-20 DIAGNOSIS — Z8774 Personal history of (corrected) congenital malformations of heart and circulatory system: Secondary | ICD-10-CM

## 2020-12-20 DIAGNOSIS — I4510 Unspecified right bundle-branch block: Secondary | ICD-10-CM | POA: Diagnosis not present

## 2020-12-20 DIAGNOSIS — R0600 Dyspnea, unspecified: Secondary | ICD-10-CM

## 2020-12-20 DIAGNOSIS — E782 Mixed hyperlipidemia: Secondary | ICD-10-CM | POA: Diagnosis not present

## 2020-12-20 DIAGNOSIS — I10 Essential (primary) hypertension: Secondary | ICD-10-CM

## 2020-12-20 DIAGNOSIS — R002 Palpitations: Secondary | ICD-10-CM

## 2020-12-20 LAB — CBC
Hematocrit: 40.2 % (ref 34.0–46.6)
Hemoglobin: 12.9 g/dL (ref 11.1–15.9)
MCH: 30.1 pg (ref 26.6–33.0)
MCHC: 32.1 g/dL (ref 31.5–35.7)
MCV: 94 fL (ref 79–97)
Platelets: 269 10*3/uL (ref 150–450)
RBC: 4.29 x10E6/uL (ref 3.77–5.28)
RDW: 11.6 % — ABNORMAL LOW (ref 11.7–15.4)
WBC: 6.9 10*3/uL (ref 3.4–10.8)

## 2020-12-20 NOTE — Progress Notes (Signed)
Cardiology Office Note:    Date:  12/22/2020   ID:  Stephanie Garrison, DOB 1965/07/16, MRN [id number]  PCP:  [name], [facility] Cardiologist:  Quay Burow, MD     Referring MD: [name], FNP   Chief Complaint  Patient presents with   Follow-up    Seen for Dr. Gwenlyn Found    History of Present Illness:    Stephanie Garrison is a 55 y.o. 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  According to the previous note, there was some suspicion she had an episode of A. fib as result of excessive alcohol intake.  Echocardiogram performed in August 2011 showed normal EF, normal valvular function, borderline dilated right ventricle with normal RV systolic function.  Repeat echocardiogram in September 2017 showed normal EF, mild pulm hypertension with no evidence of VSD.  Patient was last seen by Dr. Gwenlyn Found in October 2021 at which time she was doing well.  Patient presents today for evaluation of 2 weeks onset of worsening dyspnea.  Symptom only occurs when she exert herself.  It typically last about 5 minutes and goes away by itself.  She denies any chest pain however does have episodes of palpitation.  Palpitation typically occur also with shortness of breath as well.  She does not know how fast her heart rate goes up.  Previous work-up obtained this year showed a stable renal function and electrolyte.  TSH remains borderline elevated on the pravastatin.  I recommended a CBC to make sure she does not have any significant anemia.  I also recommended a echocardiogram and a 2-week ZIO monitor.   Past Medical History:  Diagnosis Date   GERD (gastroesophageal reflux disease)    OTC   Hypertension    Hypothyroidism    RBBB (right bundle branch block)    recurrent, asymptomatic   S/P CABG (coronary artery bypass graft) 1964   when she was 55 years old   S/P repair of tetralogy of Fallot     2D ECHO, 10/10/2009 - EF >55%, normal to mild   Thyroid disease    hypothyroidism    Past Surgical History:  Procedure Laterality Date   CARDIAC SURGERY  1970   repair Tetrology of Fallot   CESAREAN SECTION     COLONOSCOPY     DILATION AND CURETTAGE OF UTERUS     SHOULDER ARTHROSCOPY WITH SUBACROMIAL DECOMPRESSION, ROTATOR CUFF REPAIR AND BICEP TENDON REPAIR Right 09/20/2017   Procedure: RIGHT SHOULDER ARTHROSCOPY WITH DEBRIDEMENT EXTENSIVE, SUBACROMIAL DECOMPRESSION, ROTATOR CUFF REPAIR AND BICEP TENODESIS;  Surgeon: Hiram Gash, MD;  Location: [facility];  Service: Orthopedics;  Laterality: Right;    Current Medications: Current Meds  Medication Sig   amLODipine (NORVASC) 5 MG tablet TAKE 1 TABLET DAILY   levothyroxine (SYNTHROID) 50 MCG tablet TAKE 1 TABLET DAILY   olmesartan-hydrochlorothiazide (BENICAR HCT) 40-25 MG tablet TAKE 1 TABLET DAILY (NEED AN APPOINTMENT)   pravastatin (PRAVACHOL) 20 MG tablet TAKE 1 TABLET DAILY   Vitamin D, Ergocalciferol, (DRISDOL) 1.25 MG (50000 UNIT) CAPS capsule TAKE 1 CAPSULE EVERY 7 DAYS     Allergies:   Patient has no known allergies.   Social History   Socioeconomic History   Marital status: Married    Spouse name: Not on file   Number of children: Not on file   Years of education: Not on file   Highest education level: Not  on file  Occupational History   Not on file  Tobacco Use   Smoking status: Never   Smokeless tobacco: Never  Substance and Sexual Activity   Alcohol use: Yes    Comment: 3 drinks tonight or more    Drug use: No   Sexual activity: Yes  Other Topics Concern   Not on file  Social History Narrative   Not on file   Social Determinants of Health   Financial Resource Strain: Not on file  Food Insecurity: Not on file  Transportation Needs: Not on file  Physical Activity: Not on file  Stress: Not on file  Social Connections: Not on file     Family History: The patient's family history  includes Hyperlipidemia in her mother.  ROS:   Please see the history of present illness.     All other systems reviewed and are negative.  EKGs/Labs/Other Studies Reviewed:    The following studies were reviewed today:  Echo 11/20/2015 LV EF: 55% -   60%   -------------------------------------------------------------------  Indications:      (Z61.096).   -------------------------------------------------------------------  History:   PMH:  S/p repair of tetralogy of Fallot 1970. RBBB.  Acquired from the patient and from the patient&'s chart.  Risk  factors:  Hypertension.   -------------------------------------------------------------------  Study Conclusions   - Left ventricle: The cavity size was normal. Wall thickness was    increased in a pattern of mild LVH. Systolic function was normal.    The estimated ejection fraction was in the range of 55% to 60%.    Wall motion was normal; there were no regional wall motion    abnormalities. Left ventricular diastolic function parameters    were normal.  - Aortic valve: There was trivial regurgitation.  - Right ventricle: The cavity size was mildly dilated. Wall    thickness was normal.  - Right atrium: The atrium was mildly dilated.  - Atrial septum: No defect or patent foramen ovale was identified.  - Pulmonic valve: MIld turbulence across the valve but no stenosis    RVOT normal with no residual VSD noted  - Pulmonary arteries: PA peak pressure: 33 mm Hg (S).   EKG:  EKG is ordered today.  The ekg ordered today demonstrates normal sinus rhythm, right bundle branch block.  Recent Labs: 07/04/2020: ALT 20; TSH 3.960 10/02/2020: BUN 21; Creatinine, Ser 0.70; Potassium 3.9; Sodium 139 12/20/2020: Hemoglobin 12.9; Platelets 269  Recent Lipid Panel    Component Value Date/Time   CHOL 216 (H) 10/02/2020 1429   TRIG 207 (H) 10/02/2020 1429   HDL 69 10/02/2020 1429   CHOLHDL 3.1 10/02/2020 1429   LDLCALC 112 (H) 10/02/2020 1429      Risk Assessment/Calculations:           Physical Exam:    VS:  BP 132/84   Pulse 68   Ht 5\' 2"  (1.575 m)   Wt 178 lb 6.4 oz (80.9 kg)   SpO2 99%   BMI 32.63 kg/m     Wt Readings from Last 3 Encounters:  12/20/20 178 lb 6.4 oz (80.9 kg)  10/02/20 178 lb 6.4 oz (80.9 kg)  07/04/20 175 lb 3.2 oz (79.5 kg)     GEN:  Well nourished, well developed in no acute distress HEENT: Normal NECK: No JVD; No carotid bruits LYMPHATICS: No lymphadenopathy CARDIAC: RRR, no murmurs, rubs, gallops RESPIRATORY:  Clear to auscultation without rales, wheezing or rhonchi  ABDOMEN: Soft, non-tender, non-distended MUSCULOSKELETAL:  No edema; No  deformity  SKIN: Warm and dry NEUROLOGIC:  Alert and oriented x 3 PSYCHIATRIC:  Normal affect   ASSESSMENT:    1. Dyspnea, unspecified type   2. H/O tetralogy of Fallot repair   3. Mixed hyperlipidemia   4. Prediabetes   5. Primary hypertension   6. RBBB   7. Hypothyroidism, unspecified type    PLAN:    In order of problems listed above:  Dyspnea on exertion: Symptom has been going on for 2 weeks.  Previous blood work showed stable renal function and electrolyte.  Normal TSH as well.  We will obtain CBC to rule out anemia.  Obtain echocardiogram.  She also has intermittent palpitation, I recommended a 2-week ZIO monitor  History of tetralogy of Fallot repair: Repeat echocardiogram.  Hyperlipidemia: Continue pravastatin  Prediabetes: Managed by primary care provider  Hypertension: Blood pressure well controlled  Chronic right bundle branch block, unchanged on today's EKG  Hypothyroidism: On levothyroxine.         Medication Adjustments/Labs and Tests Ordered: Current medicines are reviewed at length with the patient today.  Concerns regarding medicines are outlined above.  Orders Placed This Encounter  Procedures   CBC   EKG 12-Lead   ECHOCARDIOGRAM COMPLETE   No orders of the defined types were placed in this  encounter.   Patient Instructions  Medication Instructions:  Your physician recommends that you continue on your current medications as directed. Please refer to the Current Medication list given to you today.  *If you need a refill on your cardiac medications before your next appointment, please call your pharmacy*  Lab Work: Your physician recommends that you return for lab work TODAY:  CBC  If you have labs (blood work) drawn today and your tests are completely normal, you will receive your results only by: MyChart Message (if you have MyChart) OR A paper copy in the mail If you have any lab test that is abnormal or we need to change your treatment, we will call you to review the results.  Testing/Procedures: Your physician has requested that you have an echocardiogram. Echocardiography is a painless test that uses sound waves to create images of your heart. It provides your doctor with information about the size and shape of your heart and how well your heart's chambers and valves are working. This procedure takes approximately one hour. There are no restrictions for this procedure.]  Please schedule for 2-3 weeks at [facility] office or [place] physician has requested you wear a ZIO patch monitor for 14 days.  This is a single patch monitor. [employer] supplies one patch monitor per enrollment. Additional stickers are not available. Please do not apply patch if you will be having a Nuclear Stress Test,  Echocardiogram, Cardiac CT, MRI, or Chest Xray during the period you would be wearing the  monitor. The patch cannot be worn during these tests. You cannot remove and re-apply the  ZIO XT patch monitor.  Your ZIO patch monitor will be mailed 3 day USPS to your address on file. It may take 3-5 days  to receive your monitor after you have been enrolled.  Once you have received your monitor, please review the enclosed instructions.  Your monitor  has already been registered assigning a specific monitor serial # to you.  Billing and Patient Assistance Program Information  We have supplied [employer] with any of your insurance information on file for billing purposes. [employer] offers a  sliding scale Patient Assistance Program for patients that do not have  insurance, or whose insurance does not completely cover the cost of the ZIO monitor.  You must apply for the Patient Assistance Program to qualify for this discounted rate.  To apply, please call [employer] at [phone], select option 4, select option 2, ask to apply for  Patient Assistance Program. Theodore Demark will ask your household income, and how many people  are in your household. They will quote your out-of-pocket cost based on that information.  [employer] will also be able to set up a 1-month, interest-free payment plan if needed.  Applying the monitor   Shave hair from upper left chest.  Hold abrader disc by orange tab. Rub abrader in 40 strokes over the upper left chest as  indicated in your monitor instructions.  Clean area with 4 enclosed alcohol pads. Let dry.  Apply patch as indicated in monitor instructions. Patch will be placed under collarbone on left  side of chest with arrow pointing upward.  Rub patch adhesive wings for 2 minutes. Remove white label marked "1". Remove the white  label marked "2". Rub patch adhesive wings for 2 additional minutes.  While looking in a mirror, press and release button in center of patch. A small green light will  flash 3-4 times. This will be your only indicator that the monitor has been turned on.  Do not shower for the first 24 hours. You may shower after the first 24 hours.  Press the button if you feel a symptom. You will hear a small click. Record Date, Time and  Symptom in the Patient Logbook.  When you are ready to remove the patch, follow instructions on the last 2 pages of Patient  Logbook. Stick patch monitor onto  the last page of Patient Logbook.  Place Patient Logbook in the blue and white box. Use locking tab on box and tape box closed  securely. The blue and white box has prepaid postage on it. Please place it in the mailbox as  soon as possible. Your physician should have your test results approximately 7 days after the  monitor has been mailed back to [facility].  Call [place] at [phone] if you have questions regarding  your ZIO XT patch monitor. Call them immediately if you see an orange light blinking on your  monitor.  If your monitor falls off in less than 4 days, contact our Monitor department at [phone].  If your monitor becomes loose or falls off after 4 days call [employer] at [phone] for  suggestions on securing your monitor  Follow-Up: At [facility], you and your health needs are our priority.  As part of our continuing mission to provide you with exceptional heart care, we have created designated Provider Care Teams.  These Care Teams include your primary Cardiologist (physician) and Advanced Practice Providers (APPs -  Physician Assistants and Nurse Practitioners) who all work together to provide you with the care you need, when you need it.  We recommend signing up for the patient portal called "MyChart".  Sign up information is provided on this After Visit Summary.  MyChart is used to connect with patients for Virtual Visits (Telemedicine).  Patients are able to view lab/test results, encounter notes, upcoming appointments, etc.  Non-urgent messages can be sent to your provider as well.   To learn more about what you can do with MyChart, go to [email or web address].    Your next appointment:   6 week(s)  The format for your next appointment:   In Person  Provider:   Quay Burow, MD or APP  Other Instructions    Signed, Almyra Deforest, [place]  12/22/2020 9:47 PM    [facility]

## 2020-12-20 NOTE — Telephone Encounter (Signed)
Left message for Melissa with Zio to call back.  Attempted to call pt, left message for pt to return call to zio.

## 2020-12-20 NOTE — Progress Notes (Unsigned)
Enrolled patient for a 14 day Zio AT monitor to be mailed to patients home  Dr. Gwenlyn Found to read

## 2020-12-20 NOTE — Patient Instructions (Signed)
Medication Instructions:  Your physician recommends that you continue on your current medications as directed. Please refer to the Current Medication list given to you today.  *If you need a refill on your cardiac medications before your next appointment, please call your pharmacy*  Lab Work: Your physician recommends that you return for lab work TODAY:  CBC  If you have labs (blood work) drawn today and your tests are completely normal, you will receive your results only by: MyChart Message (if you have MyChart) OR A paper copy in the mail If you have any lab test that is abnormal or we need to change your treatment, we will call you to review the results.  Testing/Procedures: Your physician has requested that you have an echocardiogram. Echocardiography is a painless test that uses sound waves to create images of your heart. It provides your doctor with information about the size and shape of your heart and how well your heart's chambers and valves are working. This procedure takes approximately one hour. There are no restrictions for this procedure.]  Please schedule for 2-3 weeks at [facility] office or [place] physician has requested you wear a ZIO patch monitor for 14 days.  This is a single patch monitor. [employer] supplies one patch monitor per enrollment. Additional stickers are not available. Please do not apply patch if you will be having a Nuclear Stress Test,  Echocardiogram, Cardiac CT, MRI, or Chest Xray during the period you would be wearing the  monitor. The patch cannot be worn during these tests. You cannot remove and re-apply the  ZIO XT patch monitor.  Your ZIO patch monitor will be mailed 3 day USPS to your address on file. It may take 3-5 days  to receive your monitor after you have been enrolled.  Once you have received your monitor, please review the enclosed instructions. Your monitor  has already been  registered assigning a specific monitor serial # to you.  Billing and Patient Assistance Program Information  We have supplied [employer] with any of your insurance information on file for billing purposes. [employer] offers a sliding scale Patient Assistance Program for patients that do not have  insurance, or whose insurance does not completely cover the cost of the ZIO monitor.  You must apply for the Patient Assistance Program to qualify for this discounted rate.  To apply, please call [employer] at [phone], select option 4, select option 2, ask to apply for  Patient Assistance Program. Stephanie Garrison will ask your household income, and how many people  are in your household. They will quote your out-of-pocket cost based on that information.  [employer] will also be able to set up a 77-month, interest-free payment plan if needed.  Applying the monitor   Shave hair from upper left chest.  Hold abrader disc by orange tab. Rub abrader in 40 strokes over the upper left chest as  indicated in your monitor instructions.  Clean area with 4 enclosed alcohol pads. Let dry.  Apply patch as indicated in monitor instructions. Patch will be placed under collarbone on left  side of chest with arrow pointing upward.  Rub patch adhesive wings for 2 minutes. Remove white label marked "1". Remove the white  label marked "2". Rub patch adhesive wings for 2 additional minutes.  While looking in a mirror, press and release button in center of patch. A small green light will  flash 3-4 times. This will be your only  indicator that the monitor has been turned on.  Do not shower for the first 24 hours. You may shower after the first 24 hours.  Press the button if you feel a symptom. You will hear a small click. Record Date, Time and  Symptom in the Patient Logbook.  When you are ready to remove the patch, follow instructions on the last 2 pages of Patient  Logbook. Stick patch monitor onto the last page of Patient  Logbook.  Place Patient Logbook in the blue and white box. Use locking tab on box and tape box closed  securely. The blue and white box has prepaid postage on it. Please place it in the mailbox as  soon as possible. Your physician should have your test results approximately 7 days after the  monitor has been mailed back to [facility].  Call [place] at [phone] if you have questions regarding  your ZIO XT patch monitor. Call them immediately if you see an orange light blinking on your  monitor.  If your monitor falls off in less than 4 days, contact our Monitor department at [phone].  If your monitor becomes loose or falls off after 4 days call [employer] at [phone] for  suggestions on securing your monitor  Follow-Up: At [facility], you and your health needs are our priority.  As part of our continuing mission to provide you with exceptional heart care, we have created designated Provider Care Teams.  These Care Teams include your primary Cardiologist (physician) and Advanced Practice Providers (APPs -  Physician Assistants and Nurse Practitioners) who all work together to provide you with the care you need, when you need it.  We recommend signing up for the patient portal called "MyChart".  Sign up information is provided on this After Visit Summary.  MyChart is used to connect with patients for Virtual Visits (Telemedicine).  Patients are able to view lab/test results, encounter notes, upcoming appointments, etc.  Non-urgent messages can be sent to your provider as well.   To learn more about what you can do with MyChart, go to [email or web address].    Your next appointment:   6 week(s)  The format for your next appointment:   In Person  Provider:   Quay Burow, MD or APP  Other Instructions

## 2020-12-20 NOTE — Telephone Encounter (Signed)
Melissa from [facility] is calling to make our office aware that they have been unsuccessful at getting in touch with this pt. Melissa states that she have left several detailed messages for pt to return the call

## 2020-12-22 ENCOUNTER — Encounter: Payer: Self-pay | Admitting: Physician Assistant

## 2020-12-23 DIAGNOSIS — Z8774 Personal history of (corrected) congenital malformations of heart and circulatory system: Secondary | ICD-10-CM | POA: Diagnosis not present

## 2020-12-23 DIAGNOSIS — R002 Palpitations: Secondary | ICD-10-CM | POA: Diagnosis not present

## 2020-12-24 NOTE — Progress Notes (Signed)
Normal red blood cell count

## 2020-12-31 NOTE — Telephone Encounter (Signed)
Left message for Stephanie Garrison to return the call 

## 2020-12-31 NOTE — Telephone Encounter (Signed)
Called pt, she states she has not gotten any messages. I gave her the number to [facility] to get in contact with her.

## 2021-01-09 ENCOUNTER — Ambulatory Visit (HOSPITAL_COMMUNITY): Payer: Managed Care, Other (non HMO) | Attending: Cardiology

## 2021-01-09 ENCOUNTER — Other Ambulatory Visit: Payer: Self-pay

## 2021-01-09 DIAGNOSIS — Z8774 Personal history of (corrected) congenital malformations of heart and circulatory system: Secondary | ICD-10-CM

## 2021-01-09 DIAGNOSIS — R0600 Dyspnea, unspecified: Secondary | ICD-10-CM | POA: Diagnosis present

## 2021-01-09 LAB — ECHOCARDIOGRAM COMPLETE
Area-P 1/2: 3.91 cm2
S' Lateral: 2.2 cm

## 2021-01-20 ENCOUNTER — Telehealth: Payer: Self-pay

## 2021-01-20 NOTE — Telephone Encounter (Addendum)
Left a voice message for the patient to give the office a call back for her results of her recent Echo and monitor results.   ----- Message from [place], [place] sent at 01/13/2021  4:35 PM EST ----- Normal pumping function of heart. Normal right heart pressure. Mildly dilated ascending thoracic aorta, annual repeat image recommended to follow up on the size. Otherwise, given history of repair Tetralogy of Fallot, this echo shows no sign of any septal defect, therefore considered excellent given history of surgical repair. Nothing on this echo to explain shortness of breath with exertion  -----Message from Almyra Deforest, [place] sent at 01/17/2021 12:09 PM EST ----- Will review further during next office visit. Out of the 8 patient triggered events, 5 showed ventricular bigeminy. The amount of PVCs are still rare, and only account for less than 1 percent of total heart beat. Findings are considered benign. Continue to monitor symptom is quite reasonable. Although we can potentially use medication to suppress these irregular beats, however the same medication will slow down the heart rate. My biggest concern with medication treatment is the single episode of borderline slow heart rate of 51 beats per min that occurred around 2:30PM, not sure if Mrs. Bartram was asleep at that time. Again, will discuss and review these finding further on the next follow up

## 2021-01-22 ENCOUNTER — Ambulatory Visit
Admission: RE | Admit: 2021-01-22 | Discharge: 2021-01-22 | Disposition: A | Discharge status: Home / Self Care | Payer: Managed Care, Other (non HMO) | Source: Ambulatory Visit | Attending: Nurse Practitioner | Admitting: Nurse Practitioner

## 2021-01-22 ENCOUNTER — Encounter: Payer: Self-pay | Admitting: Nurse Practitioner

## 2021-01-22 ENCOUNTER — Other Ambulatory Visit: Payer: Self-pay

## 2021-01-22 ENCOUNTER — Ambulatory Visit (INDEPENDENT_AMBULATORY_CARE_PROVIDER_SITE_OTHER): Payer: Managed Care, Other (non HMO) | Admitting: Nurse Practitioner

## 2021-01-22 VITALS — BP 124/78 | HR 77 | Temp 98.3°F | Ht 62.0 in

## 2021-01-22 DIAGNOSIS — I10 Essential (primary) hypertension: Secondary | ICD-10-CM

## 2021-01-22 DIAGNOSIS — R051 Acute cough: Secondary | ICD-10-CM | POA: Diagnosis not present

## 2021-01-22 DIAGNOSIS — R7309 Other abnormal glucose: Secondary | ICD-10-CM | POA: Diagnosis not present

## 2021-01-22 DIAGNOSIS — E782 Mixed hyperlipidemia: Secondary | ICD-10-CM | POA: Diagnosis not present

## 2021-01-22 DIAGNOSIS — L608 Other nail disorders: Secondary | ICD-10-CM

## 2021-01-22 LAB — POC INFLUENZA A&B (BINAX/QUICKVUE)
Influenza A, POC: NEGATIVE
Influenza B, POC: NEGATIVE

## 2021-01-22 LAB — POC COVID19 BINAXNOW: SARS Coronavirus 2 Ag: NEGATIVE

## 2021-01-22 MED ORDER — PREDNISONE 10 MG (21) PO TBPK: ORAL_TABLET | ORAL | 0 refills | Status: DC

## 2021-01-22 MED ORDER — ALBUTEROL SULFATE HFA 108 (90 BASE) MCG/ACT IN AERS: 2.0000 | INHALATION_SPRAY | Freq: Four times a day (QID) | RESPIRATORY_TRACT | 2 refills | Status: DC | PRN

## 2021-01-22 MED ORDER — BENZONATATE 100 MG PO CAPS: 100.0000 mg | ORAL_CAPSULE | Freq: Four times a day (QID) | ORAL | 1 refills | Status: DC | PRN

## 2021-01-22 MED ORDER — TOLNAFTATE 1 % EX AERP: INHALATION_SPRAY | Freq: Two times a day (BID) | CUTANEOUS | 2 refills | Status: DC

## 2021-01-22 NOTE — Progress Notes (Signed)
This visit occurred during the SARS-CoV-2 public health emergency.  Safety protocols were in place, including screening questions prior to the visit, additional usage of staff PPE, and extensive cleaning of exam room while observing appropriate contact time as indicated for disinfecting solutions.  Subjective:     Patient ID: Stephanie Garrison , female    DOB: 1965-12-20 , 55 y.o.   MRN: [id number]   Chief Complaint  Patient presents with   Cough     HPI  The patient is here for evaluation of a cough.  Cough This is a new problem. The current episode started in the past 7 days (Sunday). The problem has been unchanged. The problem occurs hourly. The cough is Productive of sputum (yellow in color). Associated symptoms include chills, nasal congestion, postnasal drip, rhinorrhea, shortness of breath and wheezing. Pertinent negatives include no chest pain, fever or headaches. Associated symptoms comments: Pain underneath rib cage area bilateral with coughing. Exacerbated by: unable to sleep on her back. Treatments tried: tylenol, HBP [place].    Past Medical History:  Diagnosis Date   GERD (gastroesophageal reflux disease)    OTC   Hypertension    Hypothyroidism    RBBB (right bundle branch block)    recurrent, asymptomatic   S/P CABG (coronary artery bypass graft) 1964   when she was 55 years old   S/P repair of tetralogy of Fallot    2D ECHO, 10/10/2009 - EF >55%, normal to mild   Thyroid disease    hypothyroidism     Family History  Problem Relation Age of Onset   Hyperlipidemia Mother      Current Outpatient Medications:    albuterol (VENTOLIN HFA) 108 (90 Base) MCG/ACT inhaler, Inhale 2 puffs into the lungs every 6 (six) hours as needed for wheezing or shortness of breath., Disp: 8 g, Rfl: 2   amLODipine (NORVASC) 5 MG tablet, TAKE 1 TABLET DAILY, Disp: 90 tablet, Rfl: 3   benzonatate (TESSALON PERLES) 100 MG capsule, Take 1 capsule (100 mg total) by mouth every 6 (six)  hours as needed for cough., Disp: 30 capsule, Rfl: 1   levothyroxine (SYNTHROID) 50 MCG tablet, TAKE 1 TABLET DAILY, Disp: 90 tablet, Rfl: 3   olmesartan-hydrochlorothiazide (BENICAR HCT) 40-25 MG tablet, TAKE 1 TABLET DAILY (NEED AN APPOINTMENT), Disp: 90 tablet, Rfl: 3   pravastatin (PRAVACHOL) 20 MG tablet, TAKE 1 TABLET DAILY, Disp: 90 tablet, Rfl: 3   predniSONE (STERAPRED UNI-PAK 21 TAB) 10 MG (21) TBPK tablet, Take as directed, Disp: 21 tablet, Rfl: 0   tolnaftate (TINACTIN) 1 % spray, Apply topically 2 (two) times daily., Disp: 130 g, Rfl: 2   Vitamin D, Ergocalciferol, (DRISDOL) 1.25 MG (50000 UNIT) CAPS capsule, TAKE 1 CAPSULE EVERY 7 DAYS, Disp: 12 capsule, Rfl: 3   No Known Allergies   Review of Systems  Constitutional:  Positive for chills. Negative for fever.  HENT:  Positive for postnasal drip and rhinorrhea.   Respiratory:  Positive for cough, shortness of breath and wheezing.   Cardiovascular:  Negative for chest pain.  Neurological:  Negative for headaches.    Today's Vitals   01/22/21 0922  BP: 124/78  Pulse: 77  Temp: 98.3 F (36.8 C)  SpO2: 90%  Height: '5\' 2"'  (1.575 m)   Body mass index is 32.63 kg/m.  Wt Readings from Last 3 Encounters:  12/20/20 178 lb 6.4 oz (80.9 kg)  10/02/20 178 lb 6.4 oz (80.9 kg)  07/04/20 175 lb 3.2 oz (79.5 kg)  BP Readings from Last 3 Encounters:  01/22/21 124/78  12/20/20 132/84  10/02/20 138/72    Objective:  Physical Exam Vitals reviewed.  Constitutional:      General: She is not in acute distress.    Appearance: Normal appearance. She is obese.  Cardiovascular:     Rate and Rhythm: Normal rate and regular rhythm.     Pulses: Normal pulses.     Heart sounds: Murmur heard.  Pulmonary:     Effort: Pulmonary effort is normal. No respiratory distress.     Breath sounds: Normal breath sounds. No wheezing.  Skin:    General: Skin is warm and dry.     Comments: Bilateral nailbeds are darkened with yellow color   Neurological:     General: No focal deficit present.     Mental Status: She is alert and oriented to person, place, and time.     Cranial Nerves: No cranial nerve deficit.     Motor: No weakness.  Psychiatric:        Mood and Affect: Mood normal.        Behavior: Behavior normal.        Thought Content: Thought content normal.        Judgment: Judgment normal.        Assessment And Plan:     1. Acute cough Comments: Given tessalon perles and albuterol inhaler, no abnormal lung sounds. Due to back pain will get CXR to evaluate for abnormalities. Negative rapid flu A/B and covid will send for PCR - predniSONE (STERAPRED UNI-PAK 21 TAB) 10 MG (21) TBPK tablet; Take as directed  Dispense: 21 tablet; Refill: 0 - benzonatate (TESSALON PERLES) 100 MG capsule; Take 1 capsule (100 mg total) by mouth every 6 (six) hours as needed for cough.  Dispense: 30 capsule; Refill: 1 - albuterol (VENTOLIN HFA) 108 (90 Base) MCG/ACT inhaler; Inhale 2 puffs into the lungs every 6 (six) hours as needed for wheezing or shortness of breath.  Dispense: 8 g; Refill: 2 - DG Chest 2 View; Future - Novel Coronavirus, NAA ([facility]) - POC COVID-19 - POC Influenza A&B(BINAX/QUICKVUE)  2. Essential hypertension Comments: Blood pressure is normal. Continue current medications - BMP8+eGFR  3. Mixed hyperlipidemia Comments: Good control, no current medications. Diet controlled - Lipid panel - BMP8+eGFR  4. Abnormal glucose Comments: HgbA1c was slightly up at last visit. Pending results will consider medications.  - Hemoglobin A1c  5. Nail discoloration Comments: Darkened and thickend bilateral thumb nails concerning for nail fungus, will treat with antifungal spray - tolnaftate (TINACTIN) 1 % spray; Apply topically 2 (two) times daily.  Dispense: 130 g; Refill: 2    Patient was given opportunity to ask questions. Patient verbalized understanding of the plan and was able to repeat key elements of the plan. All  questions were answered to their satisfaction.  [name], FNP   I, [name], FNP, have reviewed all documentation for this visit. The documentation on 01/22/21 for the exam, diagnosis, procedures, and orders are all accurate and complete.   IF YOU HAVE BEEN REFERRED TO A SPECIALIST, IT MAY TAKE 1-2 WEEKS TO SCHEDULE/PROCESS THE REFERRAL. IF YOU HAVE NOT HEARD FROM US/SPECIALIST IN TWO WEEKS, PLEASE GIVE [place] A CALL AT [phone].   THE PATIENT IS ENCOURAGED TO PRACTICE SOCIAL DISTANCING DUE TO THE COVID-19 PANDEMIC.

## 2021-01-22 NOTE — Patient Instructions (Signed)

## 2021-01-23 LAB — LIPID PANEL
Chol/HDL Ratio: 4.2 ratio (ref 0.0–4.4)
Cholesterol, Total: 225 mg/dL — ABNORMAL HIGH (ref 100–199)
HDL: 53 mg/dL (ref >39)
LDL Chol Calc (NIH): 131 mg/dL — ABNORMAL HIGH (ref 0–99)
Triglycerides: 231 mg/dL — ABNORMAL HIGH (ref 0–149)
VLDL Cholesterol Cal: 41 mg/dL — ABNORMAL HIGH (ref 5–40)

## 2021-01-23 LAB — BMP8+EGFR
BUN/Creatinine Ratio: 19 (ref 9–23)
BUN: 17 mg/dL (ref 6–24)
CO2: 27 mmol/L (ref 20–29)
Calcium: 9.1 mg/dL (ref 8.7–10.2)
Chloride: 97 mmol/L (ref 96–106)
Creatinine, Ser: 0.88 mg/dL (ref 0.57–1.00)
Glucose: 106 mg/dL — ABNORMAL HIGH (ref 70–99)
Potassium: 3.8 mmol/L (ref 3.5–5.2)
Sodium: 140 mmol/L (ref 134–144)
eGFR: 78 mL/min/{1.73_m2} (ref >59)

## 2021-01-23 LAB — NOVEL CORONAVIRUS, NAA: SARS-CoV-2, NAA: NOT DETECTED

## 2021-01-23 LAB — HEMOGLOBIN A1C
Est. average glucose Bld gHb Est-mCnc: 140 mg/dL
Hgb A1c MFr Bld: 6.5 % — ABNORMAL HIGH (ref 4.8–5.6)

## 2021-01-27 ENCOUNTER — Other Ambulatory Visit: Payer: Self-pay | Admitting: Nurse Practitioner

## 2021-01-27 MED ORDER — PRAVASTATIN SODIUM 40 MG PO TABS: 40.0000 mg | ORAL_TABLET | Freq: Every day | ORAL | 1 refills | Status: DC

## 2021-01-27 MED ORDER — METFORMIN HCL 500 MG PO TABS: 500.0000 mg | ORAL_TABLET | Freq: Two times a day (BID) | ORAL | 3 refills | Status: DC

## 2021-01-27 NOTE — Progress Notes (Signed)
I have increased her pravastatin to 40 mg, she can take two of the 20 mg until gone then get the new prescription from mail order.

## 2021-01-28 ENCOUNTER — Other Ambulatory Visit: Payer: Self-pay

## 2021-01-28 NOTE — Progress Notes (Signed)
Cardiology Clinic Note   Patient Name: Stephanie Garrison Date of Encounter: 01/29/2021  Primary Care Provider:  [name], [place] Primary Cardiologist:  Quay Burow, MD  Patient Profile    Stephanie Garrison 55 year old female presents the clinic today for follow-up evaluation of her essential hypertension and RBBB.  Past Medical History    Past Medical History:  Diagnosis Date   GERD (gastroesophageal reflux disease)    OTC   Hypertension    Hypothyroidism    RBBB (right bundle branch block)    recurrent, asymptomatic   S/P CABG (coronary artery bypass graft) 1964   when she was 55 years old   S/P repair of tetralogy of Fallot    2D ECHO, 10/10/2009 - EF >55%, normal to mild   Thyroid disease    hypothyroidism   Past Surgical History:  Procedure Laterality Date   CARDIAC SURGERY  1970   repair Tetrology of Fallot   CESAREAN SECTION     COLONOSCOPY     DILATION AND CURETTAGE OF UTERUS     SHOULDER ARTHROSCOPY WITH SUBACROMIAL DECOMPRESSION, ROTATOR CUFF REPAIR AND BICEP TENDON REPAIR Right 09/20/2017   Procedure: RIGHT SHOULDER ARTHROSCOPY WITH DEBRIDEMENT EXTENSIVE, SUBACROMIAL DECOMPRESSION, ROTATOR CUFF REPAIR AND BICEP TENODESIS;  Surgeon: Hiram Gash, MD;  Location: [facility];  Service: Orthopedics;  Laterality: Right;    Allergies  No Known Allergies  History of Present Illness    Stephanie Garrison has a PMH of essential hypertension, RBBB, hypothyroidism, hyperlipidemia, vitamin D deficiency and obesity.  She had repaired tetralogy of Fallot in 65.  There was some previous concern for atrial fibrillation due to excessive alcohol intake.  Echocardiogram 8/11 showed normal EF, normal valvular function, normal RV function.  Follow-up echocardiogram 9/17 showed normal EF, mild pulmonary hypertension and no evidence of VSD.  She was seen by Dr. Gwenlyn Found on 10/21 and was doing well at that time.  She presented for follow-up with Almyra Deforest PA-C on  12/20/2020.  During that time she had a 2-week period of worsening dyspnea.  Her symptoms were only present with exertion.  They would typically last for about 5 minutes and go away on their own.  She denied chest pain but did note palpitations.  Her palpitations were present with her shortness of breath.  She was not aware of the speed of her heart rate.  A cardiac event monitor was ordered and showed sinus rhythm, SVT, ST, frequent PVCs, occasional PACs, and short runs of SVT.  She presents the clinic today for follow-up evaluation states she feels well.  She has had no further episodes of irregular heartbeats and denies shortness of breath.  She reports that she does not exercise and does not eat a heart healthy diet.  She has been working on doing all of her regular activities and reports that while she wore the cardiac event monitor she did not feel any irregular heartbeats.  We reviewed her cardiac event monitor and her echocardiogram.  She expressed understanding.  I would like her to increase her physical activity, I will give her the salty 6 diet sheet, and plan follow-up in 6 months.  Today she denies chest pain, shortness of breath, lower extremity edema, fatigue, palpitations, melena, hematuria, hemoptysis, diaphoresis, weakness, presyncope, syncope, orthopnea, and PND.     Home Medications    Prior to Admission medications   Medication Sig Start Date End Date Taking? Authorizing Provider  albuterol (VENTOLIN HFA) 108 (90 Base) MCG/ACT  inhaler Inhale 2 puffs into the lungs every 6 (six) hours as needed for wheezing or shortness of breath. 01/22/21   [name], FNP  amLODipine (NORVASC) 5 MG tablet TAKE 1 TABLET DAILY 07/15/20   [name], FNP  benzonatate (TESSALON PERLES) 100 MG capsule Take 1 capsule (100 mg total) by mouth every 6 (six) hours as needed for cough. 01/22/21 01/22/22  [name], FNP  levothyroxine (SYNTHROID) 50 MCG tablet TAKE 1 TABLET DAILY 07/15/20   [name], FNP  metFORMIN (GLUCOPHAGE) 500 MG tablet Take 1 tablet (500 mg total) by mouth 2 (two) times daily with a meal. 01/27/21 01/27/22  [name], FNP  olmesartan-hydrochlorothiazide (BENICAR HCT) 40-25 MG tablet TAKE 1 TABLET DAILY (NEED AN APPOINTMENT) 09/04/20   [name], FNP  pravastatin (PRAVACHOL) 40 MG tablet Take 1 tablet (40 mg total) by mouth daily. 01/27/21   [name], FNP  predniSONE (STERAPRED UNI-PAK 21 TAB) 10 MG (21) TBPK tablet Take as directed 01/22/21   [name], FNP  tolnaftate (TINACTIN) 1 % spray Apply topically 2 (two) times daily. 01/22/21   [name], FNP  Vitamin D, Ergocalciferol, (DRISDOL) 1.25 MG (50000 UNIT) CAPS capsule TAKE 1 CAPSULE EVERY 7 DAYS 01/27/21   [name], FNP    Family History    Family History  Problem Relation Age of Onset   Hyperlipidemia Mother    She indicated that her mother is alive. She indicated that her father is deceased. She indicated that her sister is alive. She indicated that her brother is alive. She indicated that her daughter is alive.  Social History    Social History   Socioeconomic History   Marital status: Married    Spouse name: Not on file   Number of children: Not on file   Years of education: Not on file   Highest education level: Not on file  Occupational History   Not on file  Tobacco Use   Smoking status: Never   Smokeless tobacco: Never  Substance and Sexual Activity   Alcohol use: Yes    Comment: 3 drinks tonight or more    Drug use: No   Sexual activity: Yes  Other Topics Concern   Not on file  Social History Narrative   Not on file   Social Determinants of Health   Financial Resource Strain: Not on file  Food Insecurity: Not on file  Transportation Needs: Not on file  Physical Activity: Not on file  Stress: Not on file  Social Connections: Not on file  Intimate Partner Violence: Not on file     Review of Systems    General:  No chills, fever, night sweats or  weight changes.  Cardiovascular:  No chest pain, dyspnea on exertion, edema, orthopnea, palpitations, paroxysmal nocturnal dyspnea. Dermatological: No rash, lesions/masses Respiratory: No cough, dyspnea Urologic: No hematuria, dysuria Abdominal:   No nausea, vomiting, diarrhea, bright red blood per rectum, melena, or hematemesis Neurologic:  No visual changes, wkns, changes in mental status. All other systems reviewed and are otherwise negative except as noted above.  Physical Exam    VS:  BP 102/78   Pulse 78   Ht 5\' 2"  (1.575 m)   Wt 176 lb 9.6 oz (80.1 kg)   SpO2 97%   BMI 32.30 kg/m  , BMI Body mass index is 32.3 kg/m. GEN: Well nourished, well developed, in no acute distress. HEENT: normal. Neck: Supple, no JVD, carotid bruits, or masses. Cardiac: RRR, no murmurs, rubs, or  gallops. No clubbing, cyanosis, edema.  Radials/DP/PT 2+ and equal bilaterally.  Respiratory:  Respirations regular and unlabored, clear to auscultation bilaterally. GI: Soft, nontender, nondistended, BS + x 4. MS: no deformity or atrophy. Skin: warm and dry, no rash. Neuro:  Strength and sensation are intact. Psych: Normal affect.  Accessory Clinical Findings    Recent Labs: 07/04/2020: ALT 20; TSH 3.960 12/20/2020: Hemoglobin 12.9; Platelets 269 01/22/2021: BUN 17; Creatinine, Ser 0.88; Potassium 3.8; Sodium 140   Recent Lipid Panel    Component Value Date/Time   CHOL 225 (H) 01/22/2021 1022   TRIG 231 (H) 01/22/2021 1022   HDL 53 01/22/2021 1022   CHOLHDL 4.2 01/22/2021 1022   LDLCALC 131 (H) 01/22/2021 1022    ECG personally reviewed by me today-none today.  Echocardiogram 01/09/2021 IMPRESSIONS     1. No residual VSD noted. Left ventricular ejection fraction, by  estimation, is 55 to 60%. The left ventricle has normal function. The left  ventricle has no regional wall motion abnormalities. Left ventricular  diastolic parameters are consistent with  Grade I diastolic dysfunction  (impaired relaxation).   2. Right ventricular systolic function is normal. The right ventricular  size is normal. There is normal pulmonary artery systolic pressure. The  estimated right ventricular systolic pressure is 64.4 mmHg.   3. The mitral valve is normal in structure. No evidence of mitral valve  regurgitation. No evidence of mitral stenosis.   4. The aortic valve is tricuspid. Aortic valve regurgitation is trivial.  No aortic stenosis is present.   5. Aortic dilatation noted. There is mild dilatation of the ascending  aorta, measuring 43 mm.   6. The inferior vena cava is normal in size with greater than 50%  respiratory variability, suggesting right atrial pressure of 3 mmHg.   7. History of repaired Tetralogy of Fallot. No residual VSD, only trivial  pulmonic insufficiency, and the right ventricle, though poorly visualized,  looks relatively normal.  Cardiac event monitor 01/13/2021 Patch Wear Time:  13 days and 17 hours (2022-10-31T16:08:36-0400 to 2022-11-14T08:33:05-498)   Patient had a min HR of 51 bpm, max HR of 188 bpm, and avg HR of 78 bpm. Predominant underlying rhythm was Sinus Rhythm. Bundle Branch Block/IVCD was present. 3 Supraventricular Tachycardia runs occurred, the run with the fastest interval lasting 4 beats  with a max rate of 188 bpm, the longest lasting 16 beats with an avg rate of 142 bpm. Isolated SVEs were rare (<1.0%), SVE Couplets were rare (<1.0%), and SVE Triplets were rare (<1.0%). Isolated VEs were rare (<1.0%), and no VE Couplets or VE Triplets  were present. Ventricular Bigeminy was present.   1. SR/SB/ST 2. Freq PVCs, Occasional PACs 3. Short runs of SVT  Assessment & Plan   1.  DOE-breathing much better at this time.  Cardiac event monitor showed frequent PVCs and occasional PACs.  Predominant underlying rhythm was sinus rhythm.  Echocardiogram showed normal LV function and G1 DD.,  Trivial aortic regurgitation, and mild ascending aortic  dilation at 43 mm. Heart healthy low-sodium diet-salty 6 given Increase physical activity as tolerated-goal 150 minutes of moderate physical activity per week. Avoid triggers for palpitations caffeine, chocolate, EtOH, dehydration etc.  Essential hypertension-BP today 102/78.  Well-controlled at home. Continue amlodipine, olmesartan, HCTZ Heart healthy low-sodium diet-salty 6 given Increase physical activity as tolerated  Chronic RBBB-noted on cardiac event monitor.  Stable.  History of tetralogy of Fallot repair-echocardiogram showed no VSD.  Hypothyroidism-TSH 3.96 on 07/04/2020.  Reports compliance with  levothyroxine Follows with PCP  Disposition: Follow-up with Dr. Gwenlyn Found in 4 to 6 months.  Jossie Ng. Yeray Tomas NP-C    01/29/2021, 10:31 AM [address] Office [phone] Fax [phone]  Notice: This dictation was prepared with Dragon dictation along with smaller phrase technology. Any transcriptional errors that result from this process are unintentional and may not be corrected upon review.  I spent 13 minutes examining this patient, reviewing medications, and using patient centered shared decision making involving her cardiac care.  Prior to her visit I spent greater than 20 minutes reviewing her past medical history,  medications, and prior cardiac tests.

## 2021-01-29 ENCOUNTER — Other Ambulatory Visit: Payer: Self-pay

## 2021-01-29 ENCOUNTER — Encounter: Payer: Self-pay | Admitting: General Practice

## 2021-01-29 ENCOUNTER — Ambulatory Visit: Payer: Managed Care, Other (non HMO) | Admitting: General Practice

## 2021-01-29 VITALS — BP 102/78 | HR 78 | Ht 62.0 in | Wt 176.6 lb

## 2021-01-29 DIAGNOSIS — Z8774 Personal history of (corrected) congenital malformations of heart and circulatory system: Secondary | ICD-10-CM | POA: Diagnosis not present

## 2021-01-29 DIAGNOSIS — I4510 Unspecified right bundle-branch block: Secondary | ICD-10-CM | POA: Diagnosis not present

## 2021-01-29 DIAGNOSIS — R0600 Dyspnea, unspecified: Secondary | ICD-10-CM | POA: Diagnosis not present

## 2021-01-29 DIAGNOSIS — I10 Essential (primary) hypertension: Secondary | ICD-10-CM | POA: Diagnosis not present

## 2021-01-29 DIAGNOSIS — E039 Hypothyroidism, unspecified: Secondary | ICD-10-CM

## 2021-01-29 NOTE — Patient Instructions (Signed)
Medication Instructions:  The current medical regimen is effective;  continue present plan and medications as directed. Please refer to the Current Medication list given to you today.   *If you need a refill on your cardiac medications before your next appointment, please call your pharmacy*  Lab Work:   Testing/Procedures:  NONE    NONE  Special Instructions PLEASE READ AND FOLLOW SALTY 6-ATTACHED-1,800mg  daily  PLEASE INCREASE PHYSICAL ACTIVITY AS TOLERATED-AT LEAST 15 MINUTES DAILY   Please try to avoid these triggers: Do not use any products that have nicotine or tobacco in them. These include cigarettes, e-cigarettes, and chewing tobacco. If you need help quitting, ask your doctor. Eat heart-healthy foods. Talk with your doctor about the right eating plan for you. Exercise regularly as told by your doctor. Stay hydrated Do not drink alcohol, Caffeine or chocolate. Lose weight if you are overweight. Do not use drugs, including cannabis   Follow-Up: Your next appointment:  6 month(s) In Person with Quay Burow, MD   At [facility], you and your health needs are our priority.  As part of our continuing mission to provide you with exceptional heart care, we have created designated Provider Care Teams.  These Care Teams include your primary Cardiologist (physician) and Advanced Practice Providers (APPs -  Physician Assistants and Nurse Practitioners) who all work together to provide you with the care you need, when you need it.  We recommend signing up for the patient portal called "MyChart".  Sign up information is provided on this After Visit Summary.  MyChart is used to connect with patients for Virtual Visits (Telemedicine).  Patients are able to view lab/test results, encounter notes, upcoming appointments, etc.  Non-urgent messages can be sent to your provider as well.   To learn more about what you can do with MyChart, go to [email or web address].              6 SALTY  THINGS TO AVOID     1,800MG  DAILY

## 2021-01-30 ENCOUNTER — Ambulatory Visit: Payer: Managed Care, Other (non HMO) | Admitting: Nurse Practitioner

## 2021-02-03 ENCOUNTER — Ambulatory Visit: Payer: Managed Care, Other (non HMO) | Admitting: Nurse Practitioner

## 2021-03-06 ENCOUNTER — Ambulatory Visit: Payer: Managed Care, Other (non HMO) | Admitting: Nurse Practitioner

## 2021-03-11 ENCOUNTER — Other Ambulatory Visit: Payer: Self-pay

## 2021-03-11 ENCOUNTER — Encounter: Payer: Self-pay | Admitting: Nurse Practitioner

## 2021-03-11 ENCOUNTER — Ambulatory Visit (INDEPENDENT_AMBULATORY_CARE_PROVIDER_SITE_OTHER): Payer: 59 | Admitting: Nurse Practitioner

## 2021-03-11 VITALS — BP 124/86 | HR 85 | Temp 98.2°F | Ht 62.0 in | Wt 172.2 lb

## 2021-03-11 DIAGNOSIS — E6609 Other obesity due to excess calories: Secondary | ICD-10-CM

## 2021-03-11 DIAGNOSIS — Z6831 Body mass index (BMI) 31.0-31.9, adult: Secondary | ICD-10-CM

## 2021-03-11 DIAGNOSIS — E559 Vitamin D deficiency, unspecified: Secondary | ICD-10-CM

## 2021-03-11 DIAGNOSIS — I10 Essential (primary) hypertension: Secondary | ICD-10-CM | POA: Diagnosis not present

## 2021-03-11 DIAGNOSIS — E039 Hypothyroidism, unspecified: Secondary | ICD-10-CM

## 2021-03-11 NOTE — Patient Instructions (Signed)

## 2021-03-11 NOTE — Progress Notes (Signed)
I,Katawbba Wiggins,acting as a Education administrator for [employer], NP.,have documented all relevant documentation on the behalf of [employer], NP,as directed by  Bary Castilla, NP while in the presence of Bary Castilla, NP.  This visit occurred during the SARS-CoV-2 public health emergency.  Safety protocols were in place, including screening questions prior to the visit, additional usage of staff PPE, and extensive cleaning of exam room while observing appropriate contact time as indicated for disinfecting solutions.  Subjective:     Patient ID: Stephanie Garrison , female    DOB: 1965-04-09 , 56 y.o.   MRN: [id number]   Chief Complaint  Patient presents with   Hypertension    HPI  Pt here today for hypertension f/u. She is doing good with it. She has been taking her levothyroxine with other meds. Educated patient on how to take it with just itself and a full glass of water 30-1 hour prior to taking any other meds or food.  Her BP today was 124/86   Wt Readings from Last 3 Encounters: 03/11/21 : 172 lb 3.2 oz (78.1 kg) 01/29/21 : 176 lb 9.6 oz (80.1 kg) 12/20/20 : 178 lb 6.4 oz (80.9 kg)    Hypertension This is a chronic problem. The current episode started more than 1 year ago. The problem is unchanged. The problem is controlled. Pertinent negatives include no chest pain, headaches, palpitations or shortness of breath. There are no associated agents to hypertension. Risk factors for coronary artery disease include obesity. Past treatments include diuretics and angiotensin blockers. The current treatment provides moderate improvement. Compliance problems include exercise and diet.  There is no history of angina or kidney disease. There is no history of chronic renal disease.    Past Medical History:  Diagnosis Date   GERD (gastroesophageal reflux disease)    OTC   Hypertension    Hypothyroidism    RBBB (right bundle branch block)    recurrent, asymptomatic   S/P CABG  (coronary artery bypass graft) 1964   when she was 56 years old   S/P repair of tetralogy of Fallot    2D ECHO, 10/10/2009 - EF >55%, normal to mild   Thyroid disease    hypothyroidism     Family History  Problem Relation Age of Onset   Hyperlipidemia Mother      Current Outpatient Medications:    amLODipine (NORVASC) 5 MG tablet, TAKE 1 TABLET DAILY, Disp: 90 tablet, Rfl: 3   levothyroxine (SYNTHROID) 50 MCG tablet, TAKE 1 TABLET DAILY, Disp: 90 tablet, Rfl: 3   metFORMIN (GLUCOPHAGE) 500 MG tablet, Take 1 tablet (500 mg total) by mouth 2 (two) times daily with a meal., Disp: 60 tablet, Rfl: 3   olmesartan-hydrochlorothiazide (BENICAR HCT) 40-25 MG tablet, TAKE 1 TABLET DAILY (NEED AN APPOINTMENT), Disp: 90 tablet, Rfl: 3   pravastatin (PRAVACHOL) 40 MG tablet, Take 1 tablet (40 mg total) by mouth daily., Disp: 90 tablet, Rfl: 1   Vitamin D, Ergocalciferol, (DRISDOL) 1.25 MG (50000 UNIT) CAPS capsule, TAKE 1 CAPSULE EVERY 7 DAYS, Disp: 12 capsule, Rfl: 3   No Known Allergies   Review of Systems  Constitutional: Negative.  Negative for chills and fatigue.  Respiratory: Negative.  Negative for cough, shortness of breath and wheezing.   Cardiovascular: Negative.  Negative for chest pain and palpitations.  Gastrointestinal: Negative.  Negative for constipation, diarrhea and vomiting.  Musculoskeletal:  Negative for arthralgias and myalgias.  Neurological:  Negative for weakness, numbness and headaches.  Psychiatric/Behavioral: Negative.  All other systems reviewed and are negative.   Today's Vitals   03/11/21 1622  BP: 124/86  Pulse: 85  Temp: 98.2 F (36.8 C)  Weight: 172 lb 3.2 oz (78.1 kg)  Height: 5\' 2"  (1.575 m)   Body mass index is 31.5 kg/m.  Wt Readings from Last 3 Encounters:  03/11/21 172 lb 3.2 oz (78.1 kg)  01/29/21 176 lb 9.6 oz (80.1 kg)  12/20/20 178 lb 6.4 oz (80.9 kg)   BP Readings from Last 3 Encounters:  03/11/21 124/86  01/29/21 102/78  01/22/21  124/78  . Objective:  Physical Exam Constitutional:      Appearance: Normal appearance.  HENT:     Head: Normocephalic and atraumatic.  Cardiovascular:     Rate and Rhythm: Normal rate and regular rhythm.     Pulses: Normal pulses.     Heart sounds: Normal heart sounds. No murmur heard. Pulmonary:     Effort: Pulmonary effort is normal. No respiratory distress.     Breath sounds: Normal breath sounds. No wheezing.  Skin:    General: Skin is warm and dry.     Capillary Refill: Capillary refill takes less than 2 seconds.  Neurological:     Mental Status: She is alert and oriented to person, place, and time.        Assessment And Plan:     1. Essential hypertension -Stable  -Reviewed labs  -Continue meds  -advised patient to cut down her intake of processed foods and salt intake.   2. Acquired hypothyroidism -Continue levothyroxine 50 mcg. Will check labs today and adjust if needed.  -Educated patient on taking med by itself in the morning with glass of water alone. Without any other food or meds. Pt. Verbalized understanding on the direction.  - TSH + free T4  3. Vitamin D deficiency -Will check Vit D today and assess.  - Vitamin D (25 hydroxy)  4. Class 1 obesity due to excess calories with serious comorbidity and body mass index (BMI) of 31.0 to 31.9 in adult  Advised patient on a healthy diet including avoiding fast food and red meats. Increase the intake of lean meats including grilled chicken and [place].  Drink a lot of water. Decrease intake of fatty foods. Exercise for 30-45 min. 4-5 a week to decrease the risk of cardiac event.   The patient was encouraged to call or send a message through [facility] for any questions or concerns.   Follow up: if symptoms persist or do not get better.   Side effects and appropriate use of all the medication(s) were discussed with the patient today. Patient advised to use the medication(s) as directed by their healthcare provider. The  patient was encouraged to read, review, and understand all associated package inserts and contact our office with any questions or concerns. The patient accepts the risks of the treatment plan and had an opportunity to ask questions.   Patient was given opportunity to ask questions. Patient verbalized understanding of the plan and was able to repeat key elements of the plan. All questions were answered to their satisfaction.  Raman Wilder Kurowski, DNP   I, Raman Jaisa Defino have reviewed all documentation for this visit. The documentation on 03/11/21 for the exam, diagnosis, procedures, and orders are all accurate and complete.    IF YOU HAVE BEEN REFERRED TO A SPECIALIST, IT MAY TAKE 1-2 WEEKS TO SCHEDULE/PROCESS THE REFERRAL. IF YOU HAVE NOT HEARD FROM US/SPECIALIST IN TWO WEEKS, PLEASE GIVE [place] A CALL  AT [phone].   THE PATIENT IS ENCOURAGED TO PRACTICE SOCIAL DISTANCING DUE TO THE COVID-19 PANDEMIC.

## 2021-03-12 LAB — TSH+FREE T4
Free T4: 1.54 ng/dL (ref 0.82–1.77)
TSH: 2.46 u[IU]/mL (ref 0.450–4.500)

## 2021-03-12 LAB — VITAMIN D 25 HYDROXY (VIT D DEFICIENCY, FRACTURES): Vit D, 25-Hydroxy: 68.6 ng/mL (ref 30.0–100.0)

## 2021-04-14 ENCOUNTER — Encounter: Payer: Managed Care, Other (non HMO) | Admitting: Nurse Practitioner

## 2021-05-23 ENCOUNTER — Other Ambulatory Visit: Payer: Self-pay | Admitting: Nurse Practitioner

## 2021-06-11 ENCOUNTER — Ambulatory Visit (INDEPENDENT_AMBULATORY_CARE_PROVIDER_SITE_OTHER): Payer: 59 | Admitting: Nurse Practitioner

## 2021-06-11 ENCOUNTER — Encounter: Payer: Self-pay | Admitting: Nurse Practitioner

## 2021-06-11 VITALS — BP 128/70 | HR 76 | Temp 98.2°F | Ht 62.0 in | Wt 170.0 lb

## 2021-06-11 DIAGNOSIS — E782 Mixed hyperlipidemia: Secondary | ICD-10-CM

## 2021-06-11 DIAGNOSIS — Z0000 Encounter for general adult medical examination without abnormal findings: Secondary | ICD-10-CM

## 2021-06-11 DIAGNOSIS — Z79899 Other long term (current) drug therapy: Secondary | ICD-10-CM

## 2021-06-11 DIAGNOSIS — E039 Hypothyroidism, unspecified: Secondary | ICD-10-CM | POA: Diagnosis not present

## 2021-06-11 DIAGNOSIS — Z6831 Body mass index (BMI) 31.0-31.9, adult: Secondary | ICD-10-CM

## 2021-06-11 DIAGNOSIS — I10 Essential (primary) hypertension: Secondary | ICD-10-CM | POA: Diagnosis not present

## 2021-06-11 DIAGNOSIS — R7309 Other abnormal glucose: Secondary | ICD-10-CM

## 2021-06-11 DIAGNOSIS — Z8616 Personal history of COVID-19: Secondary | ICD-10-CM

## 2021-06-11 DIAGNOSIS — E559 Vitamin D deficiency, unspecified: Secondary | ICD-10-CM

## 2021-06-11 DIAGNOSIS — R319 Hematuria, unspecified: Secondary | ICD-10-CM

## 2021-06-11 LAB — POCT URINALYSIS DIPSTICK
Bilirubin, UA: NEGATIVE
Glucose, UA: NEGATIVE
Ketones, UA: NEGATIVE
Leukocytes, UA: NEGATIVE
Nitrite, UA: NEGATIVE
Protein, UA: NEGATIVE
Spec Grav, UA: 1.015 (ref 1.010–1.025)
Urobilinogen, UA: 0.2 E.U./dL
pH, UA: 8 (ref 5.0–8.0)

## 2021-06-11 MED ORDER — METFORMIN HCL 500 MG PO TABS: ORAL_TABLET | ORAL | 1 refills | Status: DC

## 2021-06-11 NOTE — Progress Notes (Signed)
?Industrial/product designer as a Education administrator for [employer], FNP.,have documented all relevant documentation on the behalf of [name], FNP,as directed by  [name], FNP while in the presence of [name], [place]. ? ?This visit occurred during the SARS-CoV-2 public health emergency.  Safety protocols were in place, including screening questions prior to the visit, additional usage of staff PPE, and extensive cleaning of exam room while observing appropriate contact time as indicated for disinfecting solutions. ? ?Subjective:  ?  ? Patient ID: Stephanie Garrison , female    DOB: January 13, 1966 , 56 y.o.   MRN: [id number] ? ? ?Chief Complaint  ?Patient presents with  ? Annual Exam  ? ? ?HPI ? ?Pt here today for HM. Reports having covid on 06/04/2021. She had pain to her back and chills. When she tried to go to work on Wednesday she did not feel right and felt sluggish.  She took Tylenol for her back pain. HBP coricidan once for cough  ? ?Hypertension ?This is a chronic problem. The current episode started more than 1 year ago. The problem is unchanged. The problem is controlled. Pertinent negatives include no chest pain, headaches, palpitations or shortness of breath. There are no associated agents to hypertension. Risk factors for coronary artery disease include obesity. Past treatments include diuretics and angiotensin blockers. The current treatment provides moderate improvement. Compliance problems include exercise and diet.  There is no history of angina or kidney disease. There is no history of chronic renal disease.   ? ?Past Medical History:  ?Diagnosis Date  ? GERD (gastroesophageal reflux disease)   ? OTC  ? Hypertension   ? Hypothyroidism   ? RBBB (right bundle branch block)   ? recurrent, asymptomatic  ? S/P CABG (coronary artery bypass graft) 1964  ? when she was 56 years old  ? S/P repair of tetralogy of Fallot   ? 2D ECHO, 10/10/2009 - EF >55%, normal to mild  ? Thyroid disease   ? hypothyroidism  ?   ? ?Family History  ?Problem Relation Age of Onset  ? Hyperlipidemia Mother   ? ? ? ?Current Outpatient Medications:  ?  amLODipine (NORVASC) 5 MG tablet, TAKE 1 TABLET DAILY, Disp: 90 tablet, Rfl: 3 ?  levothyroxine (SYNTHROID) 50 MCG tablet, TAKE 1 TABLET DAILY, Disp: 90 tablet, Rfl: 3 ?  olmesartan-hydrochlorothiazide (BENICAR HCT) 40-25 MG tablet, TAKE 1 TABLET DAILY (NEED AN APPOINTMENT), Disp: 90 tablet, Rfl: 3 ?  pravastatin (PRAVACHOL) 40 MG tablet, Take 1 tablet (40 mg total) by mouth daily., Disp: 90 tablet, Rfl: 1 ?  Vitamin D, Ergocalciferol, (DRISDOL) 1.25 MG (50000 UNIT) CAPS capsule, TAKE 1 CAPSULE EVERY 7 DAYS, Disp: 12 capsule, Rfl: 3 ?  metFORMIN (GLUCOPHAGE) 500 MG tablet, Take 2 tabs by mouth daily, Disp: 180 tablet, Rfl: 1  ? ?No Known Allergies  ? ? ?The patient states she uses none for birth control. LMP 2 days last month. No LMP recorded. (Menstrual status: Irregular Periods).   Negative for Dysmenorrhea and Negative for Menorrhagia. Negative for: breast discharge, breast lump(s), breast pain and breast self exam. Associated symptoms include abnormal vaginal bleeding. Pertinent negatives include abnormal bleeding (hematology), anxiety, decreased libido, depression, difficulty falling sleep, dyspareunia, history of infertility, nocturia, sexual dysfunction, sleep disturbances, urinary incontinence, urinary urgency, vaginal discharge and vaginal itching. Diet regular - she is trying to avoid high carb foods. The patient states her exercise level is minimal - 2 days a week for 10-20 minutes  ? ?The patient's tobacco  use is:  ?Social History  ? ?Tobacco Use  ?Smoking Status Never  ?Smokeless Tobacco Never  ? ?She has been exposed to passive smoke. The patient's alcohol use is:  ?Social History  ? ?Substance and Sexual Activity  ?Alcohol Use Yes  ? Comment: 3 drinks tonight or more   ? ?Additional information: Last pap 04/10/2020, next one scheduled for 04/11/2023.   ? ?Review of Systems   ?Constitutional: Negative.   ?HENT: Negative.    ?Eyes: Negative.   ?Respiratory: Negative.  Negative for shortness of breath.   ?Cardiovascular: Negative.  Negative for chest pain and palpitations.  ?Gastrointestinal: Negative.   ?Endocrine: Negative.   ?Genitourinary: Negative.   ?Musculoskeletal: Negative.   ?Skin: Negative.   ?Allergic/Immunologic: Negative.   ?Neurological: Negative.  Negative for headaches.  ?Hematological: Negative.   ?Psychiatric/Behavioral: Negative.     ? ?Today's Vitals  ? 06/11/21 1213  ?BP: 128/70  ?Pulse: 76  ?Temp: 98.2 ?F (36.8 ?C)  ?TempSrc: Oral  ?Weight: 170 lb (77.1 kg)  ?Height: '5\' 2"'$  (1.575 m)  ? ?Body mass index is 31.09 kg/m?.  ? ?Objective:  ?Physical Exam ?Vitals reviewed.  ?Constitutional:   ?   General: She is not in acute distress. ?   Appearance: Normal appearance. She is well-developed. She is obese.  ?HENT:  ?   Head: Normocephalic and atraumatic.  ?   Right Ear: Hearing, tympanic membrane, ear canal and external ear normal. There is no impacted cerumen.  ?   Left Ear: Hearing, tympanic membrane, ear canal and external ear normal. There is no impacted cerumen.  ?   Nose:  ?   Comments: Deferred - masked ?   Mouth/Throat:  ?   Comments: Deferred - masked ?Eyes:  ?   General: Lids are normal.  ?   Extraocular Movements: Extraocular movements intact.  ?   Conjunctiva/sclera: Conjunctivae normal.  ?   Pupils: Pupils are equal, round, and reactive to light.  ?   Funduscopic exam: ?   Right eye: No papilledema.     ?   Left eye: No papilledema.  ?Neck:  ?   Thyroid: No thyroid mass.  ?   Vascular: No carotid bruit.  ?Cardiovascular:  ?   Rate and Rhythm: Normal rate and regular rhythm.  ?   Pulses: Normal pulses.  ?   Heart sounds: Normal heart sounds. No murmur heard. ?Pulmonary:  ?   Effort: Pulmonary effort is normal. No respiratory distress.  ?   Breath sounds: Normal breath sounds. No wheezing.  ?Chest:  ?   Chest wall: No mass.  ?Breasts: ?   Tanner Score is 5.  ?    Right: Normal. No mass or tenderness.  ?   Left: Normal. No mass or tenderness.  ?Abdominal:  ?   General: Abdomen is flat. Bowel sounds are normal. There is no distension.  ?   Palpations: Abdomen is soft.  ?   Tenderness: There is no abdominal tenderness.  ?Musculoskeletal:     ?   General: No swelling or tenderness. Normal range of motion.  ?   Cervical back: Full passive range of motion without pain, normal range of motion and neck supple.  ?   Right lower leg: No edema.  ?   Left lower leg: No edema.  ?Lymphadenopathy:  ?   Upper Body:  ?   Right upper body: No supraclavicular, axillary or pectoral adenopathy.  ?   Left upper body: No supraclavicular, axillary or pectoral  adenopathy.  ?Skin: ?   General: Skin is warm and dry.  ?   Capillary Refill: Capillary refill takes less than 2 seconds.  ?   Comments: surgical scar to chest  ?Neurological:  ?   General: No focal deficit present.  ?   Mental Status: She is alert and oriented to person, place, and time.  ?   Cranial Nerves: No cranial nerve deficit.  ?   Sensory: No sensory deficit.  ?   Motor: No weakness.  ?Psychiatric:     ?   Mood and Affect: Mood normal.     ?   Behavior: Behavior normal.     ?   Thought Content: Thought content normal.     ?   Judgment: Judgment normal.  ?  ? ?   ?Assessment And Plan:  ?   ?1. Health maintenance examination ?Behavior modifications discussed and diet history reviewed.   ?Pt will continue to exercise regularly and modify diet with low GI, plant based foods and decrease intake of processed foods.  ?Recommend intake of daily multivitamin, Vitamin D, and calcium.  ?Recommend mammogram and colonoscopy for preventive screenings, as well as recommend immunizations that include influenza, TDAP, and Shingles (decline) ? ?2. Essential hypertension ?Comments: Blood pressure is controlled, continue current medications ?- POCT Urinalysis Dipstick (07371) ?- Microalbumin / Creatinine Urine Ratio ? ?3. Acquired  hypothyroidism ?Comments: discussed how to take her levothyroxine by itself and to not take any other medications at least 2 hours after.  ?- TSH ? ?4. Vitamin D deficiency ?Will check vitamin D level and supplement as needed.    ?Also encoura

## 2021-06-11 NOTE — Patient Instructions (Signed)

## 2021-06-12 LAB — CBC
Hematocrit: 37.7 % (ref 34.0–46.6)
Hemoglobin: 12.3 g/dL (ref 11.1–15.9)
MCH: 30.1 pg (ref 26.6–33.0)
MCHC: 32.6 g/dL (ref 31.5–35.7)
MCV: 92 fL (ref 79–97)
Platelets: 256 10*3/uL (ref 150–450)
RBC: 4.08 x10E6/uL (ref 3.77–5.28)
RDW: 12.5 % (ref 11.7–15.4)
WBC: 5.6 10*3/uL (ref 3.4–10.8)

## 2021-06-12 LAB — CMP14+EGFR
ALT: 33 IU/L — ABNORMAL HIGH (ref 0–32)
AST: 37 IU/L (ref 0–40)
Albumin/Globulin Ratio: 1.4 (ref 1.2–2.2)
Albumin: 4.2 g/dL (ref 3.8–4.9)
Alkaline Phosphatase: 55 IU/L (ref 44–121)
BUN/Creatinine Ratio: 22 (ref 9–23)
BUN: 14 mg/dL (ref 6–24)
Bilirubin Total: 0.4 mg/dL (ref 0.0–1.2)
CO2: 28 mmol/L (ref 20–29)
Calcium: 9.5 mg/dL (ref 8.7–10.2)
Chloride: 99 mmol/L (ref 96–106)
Creatinine, Ser: 0.64 mg/dL (ref 0.57–1.00)
Globulin, Total: 2.9 g/dL (ref 1.5–4.5)
Glucose: 101 mg/dL — ABNORMAL HIGH (ref 70–99)
Potassium: 3.9 mmol/L (ref 3.5–5.2)
Sodium: 142 mmol/L (ref 134–144)
Total Protein: 7.1 g/dL (ref 6.0–8.5)
eGFR: 104 mL/min/{1.73_m2} (ref >59)

## 2021-06-12 LAB — LIPID PANEL
Chol/HDL Ratio: 3.6 ratio (ref 0.0–4.4)
Cholesterol, Total: 199 mg/dL (ref 100–199)
HDL: 55 mg/dL (ref >39)
LDL Chol Calc (NIH): 121 mg/dL — ABNORMAL HIGH (ref 0–99)
Triglycerides: 129 mg/dL (ref 0–149)
VLDL Cholesterol Cal: 23 mg/dL (ref 5–40)

## 2021-06-12 LAB — MICROALBUMIN / CREATININE URINE RATIO
Creatinine, Urine: 54.6 mg/dL
Microalb/Creat Ratio: 10 mg/g creat (ref 0–29)
Microalbumin, Urine: 5.5 ug/mL

## 2021-06-12 LAB — HEMOGLOBIN A1C
Est. average glucose Bld gHb Est-mCnc: 128 mg/dL
Hgb A1c MFr Bld: 6.1 % — ABNORMAL HIGH (ref 4.8–5.6)

## 2021-06-12 LAB — TSH: TSH: 4.03 u[IU]/mL (ref 0.450–4.500)

## 2021-06-13 LAB — URINE CULTURE: Organism ID, Bacteria: NO GROWTH

## 2021-07-01 ENCOUNTER — Other Ambulatory Visit: Payer: Self-pay | Admitting: Nurse Practitioner

## 2021-07-01 DIAGNOSIS — Z1231 Encounter for screening mammogram for malignant neoplasm of breast: Secondary | ICD-10-CM

## 2021-07-10 ENCOUNTER — Other Ambulatory Visit: Payer: Self-pay | Admitting: Nurse Practitioner

## 2021-07-24 ENCOUNTER — Ambulatory Visit
Admission: RE | Admit: 2021-07-24 | Discharge: 2021-07-24 | Disposition: A | Discharge status: Home / Self Care | Payer: 59 | Source: Ambulatory Visit | Attending: Nurse Practitioner | Admitting: Nurse Practitioner

## 2021-07-24 DIAGNOSIS — R319 Hematuria, unspecified: Secondary | ICD-10-CM

## 2021-07-28 ENCOUNTER — Other Ambulatory Visit: Payer: Self-pay | Admitting: Nurse Practitioner

## 2021-07-28 DIAGNOSIS — N2889 Other specified disorders of kidney and ureter: Secondary | ICD-10-CM

## 2021-07-28 DIAGNOSIS — R319 Hematuria, unspecified: Secondary | ICD-10-CM

## 2021-07-28 NOTE — Progress Notes (Signed)
Called patient to inform her renal ultrasound shows a mass to the renal pole, ordered an MRI of abdomen for better visualization.

## 2021-07-29 ENCOUNTER — Encounter: Payer: Self-pay | Admitting: Cardiovascular Disease

## 2021-07-29 ENCOUNTER — Ambulatory Visit (INDEPENDENT_AMBULATORY_CARE_PROVIDER_SITE_OTHER): Payer: 59 | Admitting: Cardiovascular Disease

## 2021-07-29 VITALS — BP 110/62 | HR 72 | Ht 62.0 in | Wt 168.4 lb

## 2021-07-29 DIAGNOSIS — I10 Essential (primary) hypertension: Secondary | ICD-10-CM

## 2021-07-29 DIAGNOSIS — E782 Mixed hyperlipidemia: Secondary | ICD-10-CM

## 2021-07-29 DIAGNOSIS — Q249 Congenital malformation of heart, unspecified: Secondary | ICD-10-CM | POA: Diagnosis not present

## 2021-07-29 DIAGNOSIS — I4510 Unspecified right bundle-branch block: Secondary | ICD-10-CM | POA: Diagnosis not present

## 2021-07-29 NOTE — Assessment & Plan Note (Signed)
History of hyperlipidemia on Pravachol 40 mg a day with lipid profile recently performed 06/11/2021 revealing total cholesterol 199, LDL 121 and HDL 55.  She says that she has been trying to "modify her diet".  We will recheck a fasting lipid liver profile in 6 months.

## 2021-07-29 NOTE — Assessment & Plan Note (Signed)
Chronic. 

## 2021-07-29 NOTE — Assessment & Plan Note (Signed)
History of essential hypertension a blood pressure measured today at 110/62.  She is on amlodipine, Benicar and hydrochlorothiazide.

## 2021-07-29 NOTE — Progress Notes (Signed)
07/29/2021 [name]   07-13-1965  [id number]  Primary Physician [name], [place] Primary Cardiologist: Lorretta Harp MD Garret Reddish, [place], [place]  HPI:  Stephanie Garrison is a 56 y.o.  mildly overweight married [place] American female, mother of 1 child, who I last saw 12/08/2019. She  has a history of repaired tetralogy of Fallot back in the 48s. Her other problems include hypertension, chronic right bundle branch block. She has been asymptomatic since I saw her last except for 1 episode of what sounds like PAF as a result of excessive alcohol intake.  Since I saw her a year and a half ago she has seen Coletta Memos, FNP as well.  She was complaining of some shortness of breath and palpitations however these symptoms have resolved.  An event monitor showed occasional PACs, PVCs and short runs of SVT but she no longer complains of palpitations.  2D echo performed 01/09/2021 was essentially normal without evidence of residual VSD, normal LV systolic function and normal valvular function.  Current Meds  Medication Sig   amLODipine (NORVASC) 5 MG tablet TAKE 1 TABLET DAILY   levothyroxine (SYNTHROID) 50 MCG tablet TAKE 1 TABLET DAILY   metFORMIN (GLUCOPHAGE) 500 MG tablet Take 2 tabs by mouth daily   olmesartan-hydrochlorothiazide (BENICAR HCT) 40-25 MG tablet TAKE 1 TABLET DAILY (NEED AN APPOINTMENT)   pravastatin (PRAVACHOL) 40 MG tablet TAKE 1 TABLET DAILY   Vitamin D, Ergocalciferol, (DRISDOL) 1.25 MG (50000 UNIT) CAPS capsule TAKE 1 CAPSULE EVERY 7 DAYS     No Known Allergies  Social History   Socioeconomic History   Marital status: Married    Spouse name: Not on file   Number of children: Not on file   Years of education: Not on file   Highest education level: Not on file  Occupational History   Not on file  Tobacco Use   Smoking status: Never   Smokeless tobacco: Never  Substance and Sexual Activity   Alcohol use: Yes    Comment: 3 drinks tonight or more     Drug use: No   Sexual activity: Yes  Other Topics Concern   Not on file  Social History Narrative   Not on file   Social Determinants of Health   Financial Resource Strain: Not on file  Food Insecurity: Not on file  Transportation Needs: Not on file  Physical Activity: Insufficiently Active   Days of Exercise per Week: 2 days   Minutes of Exercise per Session: 20 min  Stress: Not on file  Social Connections: Not on file  Intimate Partner Violence: Not on file     Review of Systems: General: negative for chills, fever, night sweats or weight changes.  Cardiovascular: negative for chest pain, dyspnea on exertion, edema, orthopnea, palpitations, paroxysmal nocturnal dyspnea or shortness of breath Dermatological: negative for rash Respiratory: negative for cough or wheezing Urologic: negative for hematuria Abdominal: negative for nausea, vomiting, diarrhea, bright red blood per rectum, melena, or hematemesis Neurologic: negative for visual changes, syncope, or dizziness All other systems reviewed and are otherwise negative except as noted above.    Blood pressure 110/62, pulse 72, height '5\' 2"'$  (1.575 m), weight 168 lb 6.4 oz (76.4 kg), SpO2 94 %.  General appearance: alert and no distress Neck: no adenopathy, no carotid bruit, no JVD, supple, symmetrical, trachea midline, and thyroid not enlarged, symmetric, no tenderness/mass/nodules Lungs: clear to auscultation bilaterally Heart: Soft outflow tract murmur Extremities: extremities normal, atraumatic, no  cyanosis or edema Pulses: 2+ and symmetric Skin: Skin color, texture, turgor normal. No rashes or lesions Neurologic: Grossly normal  EKG sinus rhythm at 72 with right bundle branch block.  I personally reviewed this EKG.  ASSESSMENT AND PLAN:   Right bundle branch block Chronic  Essential hypertension History of essential hypertension a blood pressure measured today at 110/62.  She is on amlodipine, Benicar and  hydrochlorothiazide.  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  She has been asymptomatic from this since with recent 2D echo performed 01/09/2021 revealing normal LV systolic function without valvular abnormalities and no evidence of residual VSD.  Mixed hyperlipidemia History of hyperlipidemia on Pravachol 40 mg a day with lipid profile recently performed 06/11/2021 revealing total cholesterol 199, LDL 121 and HDL 55.  She says that she has been trying to "modify her diet".  We will recheck a fasting lipid liver profile in 6 months.     Lorretta Harp MD FACP,FACC,FAHA, [facility] 07/29/2021 8:13 AM

## 2021-07-29 NOTE — Assessment & Plan Note (Signed)
History of repaired tetralogy of Fallot back in the 53s in her childhood.  She has been asymptomatic from this since with recent 2D echo performed 01/09/2021 revealing normal LV systolic function without valvular abnormalities and no evidence of residual VSD.

## 2021-07-29 NOTE — Patient Instructions (Signed)
Medication Instructions:  Your physician recommends that you continue on your current medications as directed. Please refer to the Current Medication list given to you today.  *If you need a refill on your cardiac medications before your next appointment, please call your pharmacy*   Lab Work: Your physician recommends that you return for lab work in: 6 months for FASTING lipid/liver profile.  If you have labs (blood work) drawn today and your tests are completely normal, you will receive your results only by: [place] (if you have MyChart) OR A paper copy in the mail If you have any lab test that is abnormal or we need to change your treatment, we will call you to review the results.   Follow-Up: At [facility], you and your health needs are our priority.  As part of our continuing mission to provide you with exceptional heart care, we have created designated Provider Care Teams.  These Care Teams include your primary Cardiologist (physician) and Advanced Practice Providers (APPs -  Physician Assistants and Nurse Practitioners) who all work together to provide you with the care you need, when you need it.  We recommend signing up for the patient portal called "MyChart".  Sign up information is provided on this After Visit Summary.  MyChart is used to connect with patients for Virtual Visits (Telemedicine).  Patients are able to view lab/test results, encounter notes, upcoming appointments, etc.  Non-urgent messages can be sent to your provider as well.   To learn more about what you can do with MyChart, go to [email or web address].    Your next appointment:   12 month(s)  The format for your next appointment:   In Person  Provider:   Quay Burow, MD

## 2021-08-06 ENCOUNTER — Ambulatory Visit: Payer: Self-pay

## 2021-08-06 LAB — HM DIABETES EYE EXAM

## 2021-08-07 ENCOUNTER — Ambulatory Visit
Admission: RE | Admit: 2021-08-07 | Discharge: 2021-08-07 | Disposition: A | Discharge status: Home / Self Care | Payer: 59 | Source: Ambulatory Visit | Attending: Nurse Practitioner | Admitting: Nurse Practitioner

## 2021-08-07 DIAGNOSIS — Z1231 Encounter for screening mammogram for malignant neoplasm of breast: Secondary | ICD-10-CM

## 2021-08-12 ENCOUNTER — Ambulatory Visit
Admission: RE | Admit: 2021-08-12 | Discharge: 2021-08-12 | Disposition: A | Discharge status: Home / Self Care | Payer: 59 | Source: Ambulatory Visit | Attending: Nurse Practitioner | Admitting: Nurse Practitioner

## 2021-08-12 DIAGNOSIS — R319 Hematuria, unspecified: Secondary | ICD-10-CM

## 2021-08-12 DIAGNOSIS — N2889 Other specified disorders of kidney and ureter: Secondary | ICD-10-CM

## 2021-08-12 MED ORDER — GADOBENATE DIMEGLUMINE 529 MG/ML IV SOLN
14.0000 mL | Freq: Once | INTRAVENOUS | Status: AC | PRN
Start: 2021-08-12 — End: 2021-08-12
  Administered 2021-08-12: 14 mL via INTRAVENOUS

## 2021-08-23 IMAGING — MG DIGITAL SCREENING BILAT W/ CAD
4 series · 4 of 4 positions shown · non-contrast
Comparison: Previous exam(s).

ACR Breast Density Category a: The breast tissue is almost entirely
fatty.

CLINICAL DATA: Screening.

EXAM:
DIGITAL SCREENING BILATERAL MAMMOGRAM WITH CAD
TECHNIQUE: Bilateral screening digital craniocaudal and mediolateral oblique
mammograms were obtained. The images were evaluated with
computer-aided detection.

[L CC]
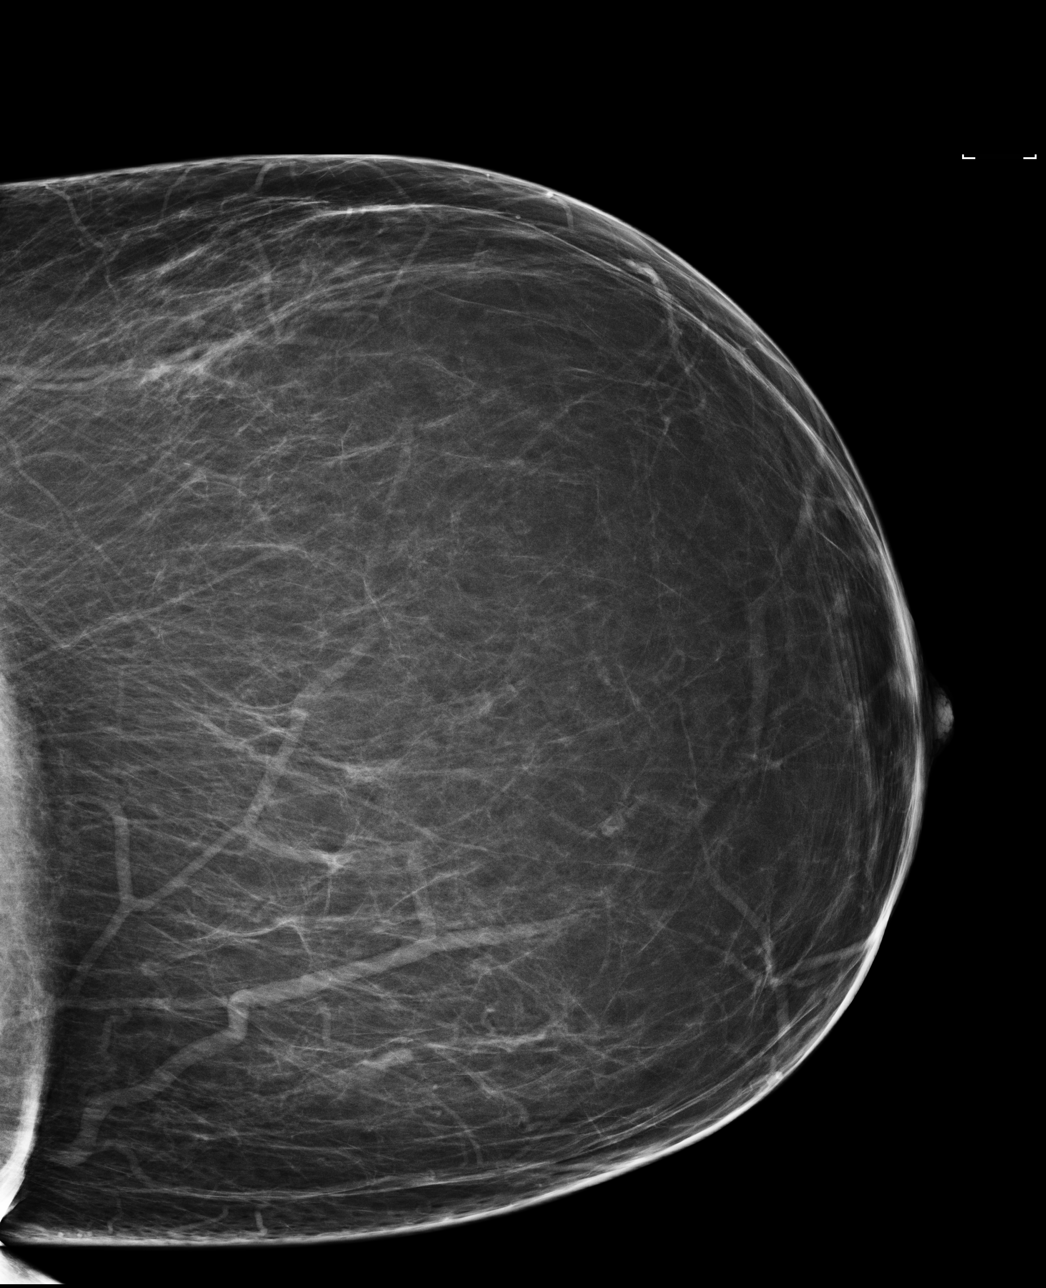

[R CC]
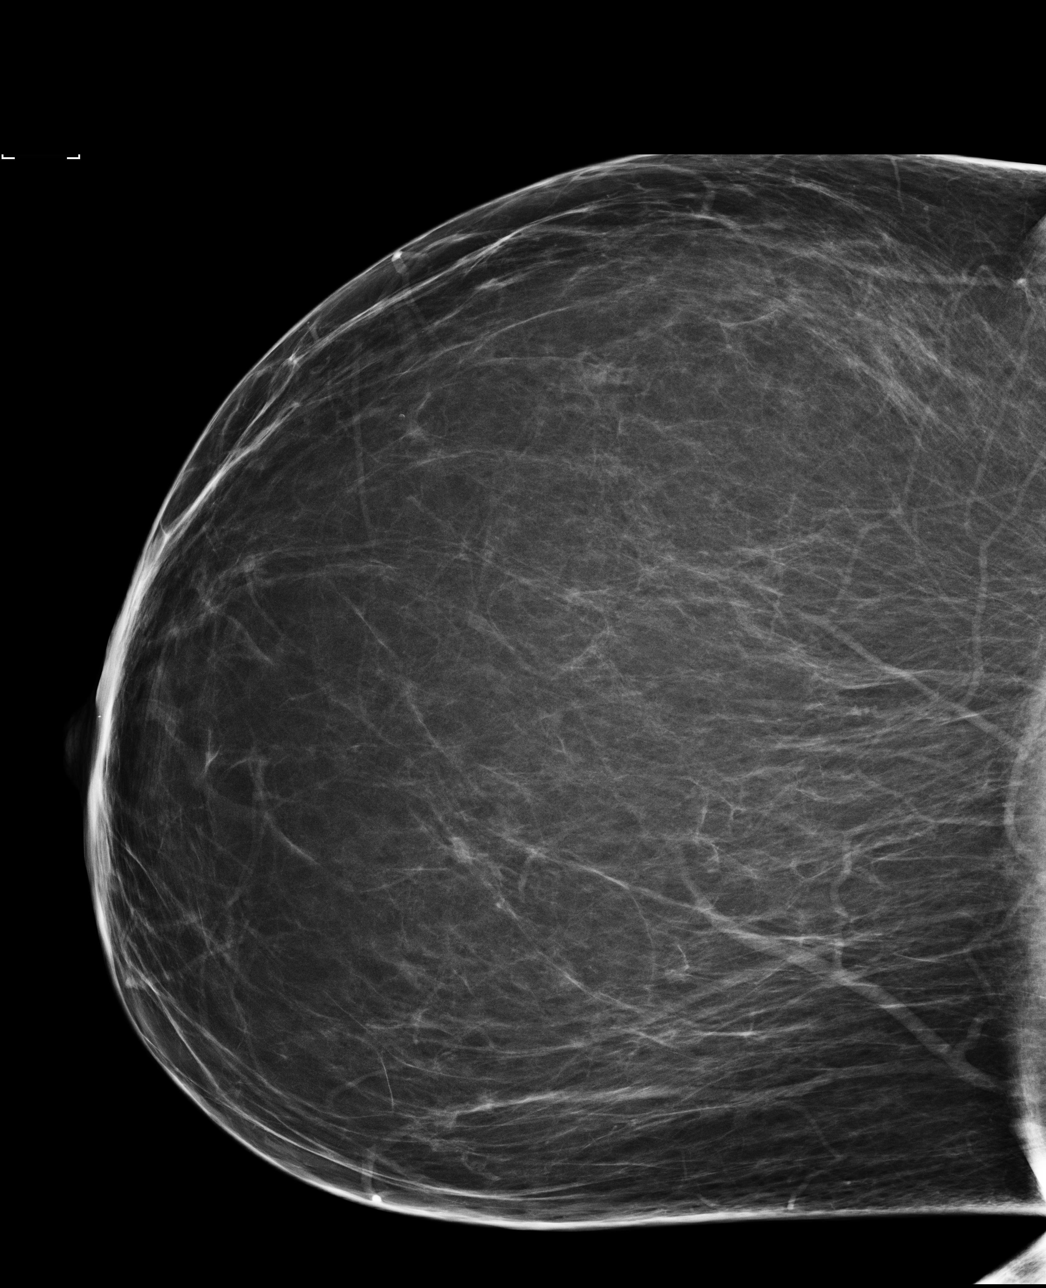

[R MLO]
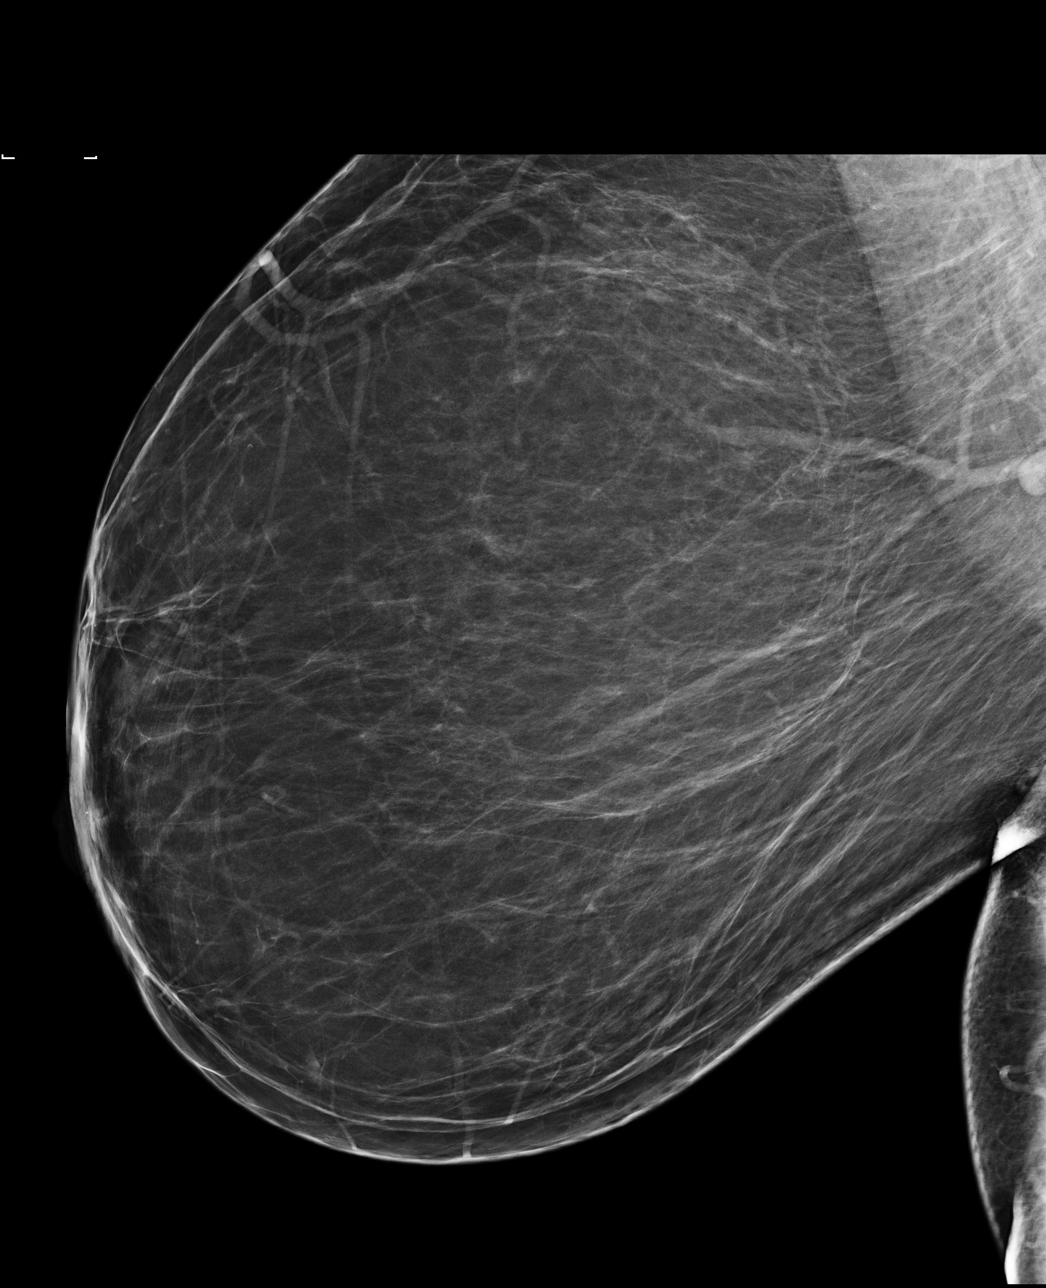

[L MLO]
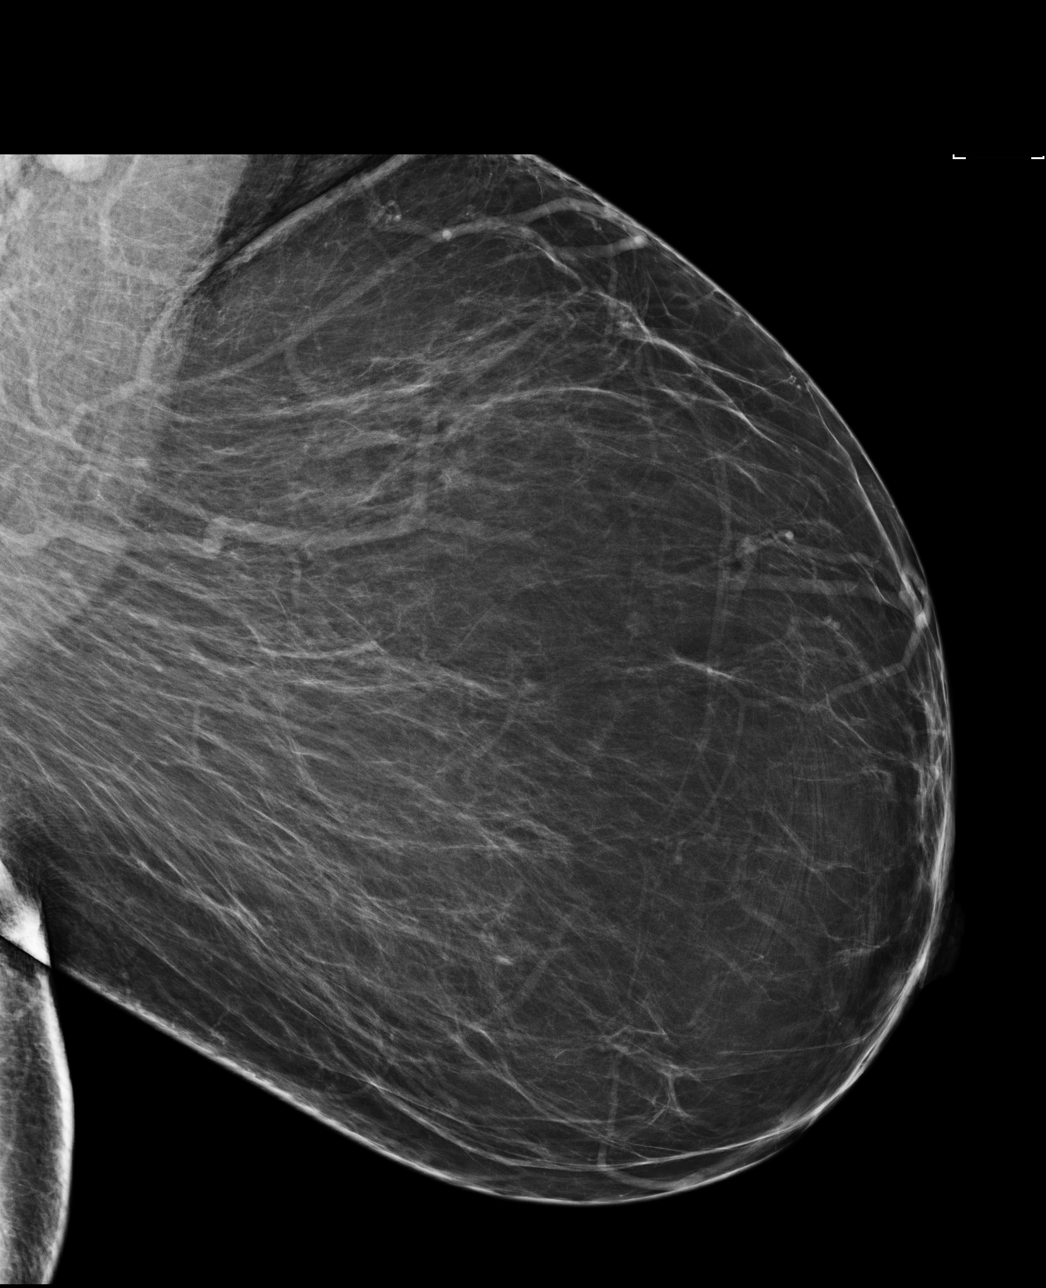

[4 of 4 positions shown; findings below may reference images not displayed]

FINDINGS: There are no findings suspicious for malignancy. The images were
evaluated with computer-aided detection.
IMPRESSION: No mammographic evidence of malignancy. A result letter of this
screening mammogram will be mailed directly to the patient.

RECOMMENDATION:
Screening mammogram in one year. (Code:XD-H-XTQ)

BI-RADS CATEGORY  1: Negative.

## 2021-08-27 ENCOUNTER — Other Ambulatory Visit: Payer: Self-pay | Admitting: Nurse Practitioner

## 2021-09-15 ENCOUNTER — Other Ambulatory Visit: Payer: Self-pay | Admitting: Nurse Practitioner

## 2021-09-15 ENCOUNTER — Telehealth: Payer: Self-pay | Admitting: Nurse Practitioner

## 2021-09-15 DIAGNOSIS — N2889 Other specified disorders of kidney and ureter: Secondary | ICD-10-CM

## 2021-09-15 NOTE — Telephone Encounter (Signed)
Called to discuss MRI results, referral made to Urology for further evaluation.

## 2021-09-17 ENCOUNTER — Ambulatory Visit: Payer: 59 | Admitting: Urology

## 2021-09-17 ENCOUNTER — Ambulatory Visit
Admission: RE | Admit: 2021-09-17 | Discharge: 2021-09-17 | Disposition: A | Discharge status: Home / Self Care | Payer: 59 | Attending: Urology | Admitting: Urology

## 2021-09-17 ENCOUNTER — Encounter: Payer: Self-pay | Admitting: Urology

## 2021-09-17 ENCOUNTER — Ambulatory Visit
Admission: RE | Admit: 2021-09-17 | Discharge: 2021-09-17 | Disposition: A | Discharge status: Home / Self Care | Payer: 59 | Source: Ambulatory Visit | Attending: Urology | Admitting: Urology

## 2021-09-17 VITALS — BP 122/85 | HR 76 | Ht 62.0 in | Wt 169.4 lb

## 2021-09-17 DIAGNOSIS — N2889 Other specified disorders of kidney and ureter: Secondary | ICD-10-CM | POA: Insufficient documentation

## 2021-09-17 NOTE — Progress Notes (Signed)
09/17/21 2:07 PM   Stephanie Garrison [birth year] [id number]  CC: Left renal mass  HPI: I saw Stephanie Garrison and her husband today for evaluation of a left renal mass.  She is an otherwise healthy 55 year old female with normal renal function(creatinine 0.64, EGFR greater than 60) who was recently found to have a 3.4 cm enhancing left lower pole renal mass.  She originally had a renal ultrasound performed on 07/25/2021 for dipstick positive blood on urinalysis showing a solid left lower pole mass, and prompted an MRI.  MRI was performed on 08/12/2021 and showed a 3.4 cm heterogenously enhancing mass of the left lower pole.  No evidence of lymphadenopathy or metastatic disease.  No chest imaging to review.  She denies any flank pain or urinary symptoms.  Denies any prior abdominal surgeries.   PMH: Past Medical History:  Diagnosis Date   GERD (gastroesophageal reflux disease)    OTC   Hypertension    Hypothyroidism    RBBB (right bundle branch block)    recurrent, asymptomatic   S/P CABG (coronary artery bypass graft) 1964   when she was 56 years old   S/P repair of tetralogy of Fallot    2D ECHO, 10/10/2009 - EF >55%, normal to mild   Thyroid disease    hypothyroidism    Surgical History: Past Surgical History:  Procedure Laterality Date   CARDIAC SURGERY  1970   repair Tetrology of Fallot   CESAREAN SECTION     COLONOSCOPY     DILATION AND CURETTAGE OF UTERUS     SHOULDER ARTHROSCOPY WITH SUBACROMIAL DECOMPRESSION, ROTATOR CUFF REPAIR AND BICEP TENDON REPAIR Right 09/20/2017   Procedure: RIGHT SHOULDER ARTHROSCOPY WITH DEBRIDEMENT EXTENSIVE, SUBACROMIAL DECOMPRESSION, ROTATOR CUFF REPAIR AND BICEP TENODESIS;  Surgeon: Hiram Gash, MD;  Location: [facility];  Service: Orthopedics;  Laterality: Right;      Family History: Family History  Problem Relation Age of Onset   Hyperlipidemia Mother    Breast cancer Cousin 70       maternal 1st cousin    Social  History:  reports that she has never smoked. She has never used smokeless tobacco. She reports current alcohol use. She reports that she does not use drugs.  Physical Exam: BP 122/85 (BP Location: Left Arm, Patient Position: Sitting, Cuff Size: Large)   Pulse 76   Ht _0  (1.575 m)   Wt 169 lb 6.4 oz (76.8 kg)   BMI 30.98 kg/m    Constitutional:  Alert and oriented, No acute distress. Cardiovascular: No clubbing, cyanosis, or edema. Respiratory: Normal respiratory effort, no increased work of breathing. GI: Abdomen is soft, nontender, nondistended, no abdominal masses  Laboratory Data: Reviewed, see HPI, normal renal function  Pertinent Imaging: I have personally viewed and interpreted the MRI showing a 3.5 cm enhancing left lower pole renal mass concerning for RCC, no evidence of metastatic disease.  Assessment & Plan:   56 year old female with 3.5 cm heterogenously enhancing left lower pole renal mass concerning for RCC.  No evidence of lymphadenopathy or metastatic disease in the abdomen.  We had a very long conversation today about her MRI findings, and we reviewed the images together in clinic.  We discussed there is a better than 80% chance that this represents a malignancy, most likely renal cell carcinoma.  We discussed options including renal mass biopsy, active surveillance, percutaneous ablation, partial nephrectomy, and radical nephrectomy.  Based on her young age and good health, I strongly recommended  considering robotic partial nephrectomy, plus or minus renal mass biopsy.  Risks and benefits were discussed extensively.  I also recommended chest x-ray today for staging purposes.  We discussed that my partner Dr. Erlene Quan performs robotic partial nephrectomies and offered her an appointment with her.  She prefers to follow-up in surgery closer to home in [place], and referral was placed to [facility] for consideration of left robotic partial nephrectomy.  -Chest x-ray  today for staging -Referral placed to [facility] for consideration of robotic left partial nephrectomy per patient preference  Nickolas Madrid, MD 09/17/2021  [facility] [address] [place], [place] [address] [phone]

## 2021-09-17 NOTE — Patient Instructions (Signed)
Renal Mass  A renal mass is an abnormal growth in the kidney. It may be found while performing an MRI, CT scan, or ultrasound to evaluate other problems of the abdomen. A renal mass that is cancerous (malignant) may grow or spread quickly. Others are not cancerous (benign). Renal masses include: Tumors. These may be malignant or benign. The most common type of kidney cancer in adults is renal cell carcinoma. In children, the most common type of kidney cancer is Wilms tumor. The most common benign tumors of the kidney include renal adenomas, oncocytomas, and angiomyolipoma (AML). Cysts. These are fluid-filled sacs that form on or in the kidney. What are the causes? Certain types of cancers, infections, or injuries can cause a renal mass. It is not always known what causes a cyst to develop in or on the kidney. What are the signs or symptoms? Often, a renal mass does not cause any signs or symptoms; most kidney cysts do not cause symptoms. How is this diagnosed? Your health care provider may recommend tests to diagnose the cause of your renal mass. These tests may be done if a renal mass is found: Physical exam. Blood tests. Urine tests. Imaging tests, such as ultrasound, CT scan, or MRI. Biopsy. This is a small sample that is removed from the renal mass and tested in a lab. The exact tests and how often they are done will depend on: The size and appearance of the renal mass. Risk factors or medical conditions that increase your risk for problems. Any symptoms associated with the renal mass, or concerns that you have about it. Tests and physical exams may be done once, or they may be done regularly for a period of time. Tests and exams that are done regularly will help monitor whether the mass is growing and beginning to cause problems. How is this treated? Treatment is not always needed for this condition. Your health care provider may recommend careful monitoring and regular tests and exams.  Treatment will depend on the cause of the mass. Treatment for a cancerous renal mass may include surgical removal, chemotherapy, radiation, or immunotherapy. Most kidney cysts do not need to be treated. Follow these instructions at home: What you need to do at home will depend on the cause of the mass. Follow the instructions that your health care provider gives to you. In general: Take over-the-counter and prescription medicines only as told by your health care provider. If you were prescribed an antibiotic medicine, take it as told by your health care provider. Do not stop taking the antibiotic even if you start to feel better. Follow any restrictions that are given to you by your health care provider. Keep all follow-up visits. This is important. You may need to see your health care provider once or twice a year to have CT scans and ultrasounds. These tests will show if your renal mass has changed or grown. Contact a health care provider if you: Have pain in your side or back (flank pain). Have a fever. Feel full soon after eating. Have pain or swelling in the abdomen. Lose weight. Get help right away if: Your pain gets worse. There is blood in your urine. You cannot urinate. You have chest pain. You have trouble breathing. These symptoms may represent a serious problem that is an emergency. Do not wait to see if the symptoms will go away. Get medical help right away. Call your local emergency services (911 in the [place] Summary A renal mass is an   abnormal growth in the kidney. It may be cancerous (malignant) and grow or spread quickly, or it may not be cancerous (benign). Renal masses often do not have any signs or symptoms. Renal masses may be found while performing an MRI, CT scan, or ultrasound for other problems of the abdomen. Your health care provider may recommend that you have tests to diagnose the cause of your renal mass. These may include a physical exam, blood tests, urine  tests, imaging, or a biopsy. Treatment is not always needed for this condition. Careful monitoring may be recommended. This information is not intended to replace advice given to you by your health care provider. Make sure you discuss any questions you have with your health care provider. Document Revised: 08/07/2019 Document Reviewed: 08/07/2019 Elsevier Patient Education  [place].    Minimally Invasive Nephrectomy Minimally invasive nephrectomy is a surgical procedure to remove a kidney. This procedure is done through several small incisions in the abdomen. You may have surgery to: Remove the entire kidney and some surrounding structures (radical nephrectomy). Remove only the damaged or diseased part of the kidney (partial nephrectomy). You may need this surgery if: Your kidney is severely damaged from disease, infection, or cancer. You were born with an abnormal kidney. You are donating a healthy kidney. Tell a health care provider about: Any allergies you have. All medicines you are taking, including vitamins, herbs, eye drops, creams, and over-the-counter medicines. Any problems you or family members have had with anesthetic medicines. Any blood disorders you have. Any surgeries you have had. Any medical conditions you have. Whether you are pregnant or may be pregnant. What are the risks? Generally, this is a safe procedure. However, problems may occur, including: Bleeding. Infection. Damage to other body structures near the kidney. Urine leaking into the abdomen. Pneumonia. A blood clot that forms in the leg and travels to the lung (pulmonary embolism). Allergic reactions to medicines. A bone (usually part of a rib) or more tissue may need to be removed so the surgeon can get to the kidney. If this happens, the minimally invasive procedure may need to be changed to an open procedure. What happens before the procedure? Staying hydrated Follow instructions from your  health care provider about hydration, which may include: Up to 2 hours before the procedure - you may continue to drink clear liquids, such as water, clear fruit juice, black coffee, and plain tea.  Eating and drinking restrictions Follow instructions from your health care provider about eating and drinking, which may include: 8 hours before the procedure - stop eating heavy meals or foods, such as meat, fried foods, or fatty foods. 6 hours before the procedure - stop eating light meals or foods, such as toast or cereal. 6 hours before the procedure - stop drinking milk or drinks that contain milk. 2 hours before the procedure - stop drinking clear liquids. Medicines Ask your health care provider about: Changing or stopping your regular medicines. This is especially important if you are taking diabetes medicines or blood thinners. Taking medicines such as aspirin and ibuprofen. These medicines can thin your blood. Do not take these medicines unless your health care provider tells you to take them. Taking over-the-counter medicines, vitamins, herbs, and supplements. Surgery safety Ask your health care provider: How your surgery site will be marked. What steps will be taken to help prevent infection. These steps may include: Removing hair at the surgery site. Washing skin with a germ-killing soap. Taking antibiotic medicine. General  instructions Do not use any products that contain nicotine or tobacco for at least 4 weeks before the procedure. These products include cigarettes, e-cigarettes, and chewing tobacco. If you need help quitting, ask your health care provider. Your health care provider may instruct you to do breathing exercises before the procedure to help prevent pneumonia. Do these exercises as directed. You may need to have your blood drawn (type and cross) in case you need to receive blood (get a transfusion) during the procedure. Plan to have a responsible adult take you home  from the hospital or clinic. Plan to have a responsible adult care for you for the time you are told after you leave the hospital or clinic. This is important. What happens during the procedure?     Before surgery begins An IV will be inserted into one of your veins. You will be given a medicine to make you fall asleep (general anesthetic). Your health care provider may take steps to help blood flow in your legs. You may have: Tight stockings, also called compression stockings, on your legs. Compression sleeves wrapped around your legs. A machine called a sequential compression device (SCD) will pump air into the sleeves. A small, thin tube will be placed in your bladder (urinary catheter) to drain urine. A tube will be placed through your nose and into your stomach (nasogastric tube) to drain stomach fluids. During surgery A small incision will be made in your abdomen to insert a thin, lighted tube (laparoscope) with a camera attached. This lets your surgeon see your kidney during the procedure. More small incisions will be made to insert other surgical tools. One incision may be slightly larger to remove your kidney. In some cases, the surgeon will do the procedure by controlling tools that are attached to a surgical robot. This is called a robot-assisted nephrectomy. The next steps depend on the type of procedure you are having. If you are having a partial nephrectomy: The blood vessels attached to your kidney will be clamped. Part of your kidney will be removed. The kidney will be closed with stitches (sutures), and the clamp on the blood vessels will be removed. If you are having a radical nephrectomy: All of the blood vessels that attach to your kidney will be closed and cut. Part of the tube that carries urine from your kidney to your bladder (ureter) will be removed. Your kidney will be removed. All of the surgical tools will be removed from your body. A small tube (drain) may be  placed near one of the incisions to drain extra fluid from the surgery area. The incisions may be closed with sutures or another type of closure. A bandage (dressing) will be placed over the incision area. The procedure may vary among health care providers and hospitals. What happens after the procedure? Your blood pressure, heart rate, breathing rate, and blood oxygen level will be monitored until you leave the hospital or clinic. Your nasogastric tube will be removed. You may continue to have: An IV until you can drink fluids on your own. A urinary catheter. Your urine output will be checked. A drain. Your surgical drain will be removed after a few days. Your health care provider will tell you how to care for the drain at home. Pain medicine. This will be given through your IV. Compression stockings or compression sleeves.This helps to prevent blood clots and reduce swelling in your legs. You will be encouraged to: Get out of bed and walk as soon as  possible. Do breathing exercises, such as coughing and breathing deeply. This helps prevent pneumonia. Summary Minimally invasive nephrectomy is a surgical procedure to remove a kidney. In some cases, only the damaged or diseased part of the kidney is removed. This procedure is done through several small incisions in the abdomen. Follow instructions from your health care provider about taking medicines and about eating and drinking before the procedure. You will be given a medicine to make you fall asleep (general anesthetic) during the procedure. This information is not intended to replace advice given to you by your health care provider. Make sure you discuss any questions you have with your health care provider. Document Revised: 06/05/2019 Document Reviewed: 06/05/2019 Elsevier Patient Education  [place].

## 2021-09-22 ENCOUNTER — Telehealth: Payer: Self-pay

## 2021-09-22 NOTE — Telephone Encounter (Signed)
Called pt informed her of the information below. Pt voiced understanding.  

## 2021-09-22 NOTE — Telephone Encounter (Signed)
-----   Message from Billey Co, MD sent at 09/19/2021  1:21 PM EDT ----- Good news, chest x-ray normal, keep follow-up with [facility] regarding renal mass  Nickolas Madrid, MD 09/19/2021

## 2021-10-07 ENCOUNTER — Encounter: Payer: Self-pay | Admitting: Nurse Practitioner

## 2021-10-07 ENCOUNTER — Ambulatory Visit: Payer: 59 | Admitting: Nurse Practitioner

## 2021-10-07 VITALS — BP 128/70 | HR 88 | Temp 98.6°F | Ht 62.0 in | Wt 166.0 lb

## 2021-10-07 DIAGNOSIS — Z79899 Other long term (current) drug therapy: Secondary | ICD-10-CM

## 2021-10-07 DIAGNOSIS — N2889 Other specified disorders of kidney and ureter: Secondary | ICD-10-CM

## 2021-10-07 DIAGNOSIS — E039 Hypothyroidism, unspecified: Secondary | ICD-10-CM | POA: Diagnosis not present

## 2021-10-07 DIAGNOSIS — E782 Mixed hyperlipidemia: Secondary | ICD-10-CM | POA: Diagnosis not present

## 2021-10-07 DIAGNOSIS — I10 Essential (primary) hypertension: Secondary | ICD-10-CM | POA: Diagnosis not present

## 2021-10-07 DIAGNOSIS — R7309 Other abnormal glucose: Secondary | ICD-10-CM

## 2021-10-07 DIAGNOSIS — E6609 Other obesity due to excess calories: Secondary | ICD-10-CM

## 2021-10-07 DIAGNOSIS — Z6830 Body mass index (BMI) 30.0-30.9, adult: Secondary | ICD-10-CM

## 2021-10-07 DIAGNOSIS — E559 Vitamin D deficiency, unspecified: Secondary | ICD-10-CM

## 2021-10-07 LAB — BMP8+EGFR
BUN/Creatinine Ratio: 22 (ref 9–23)
BUN: 16 mg/dL (ref 6–24)
CO2: 25 mmol/L (ref 20–29)
Calcium: 9.6 mg/dL (ref 8.7–10.2)
Chloride: 99 mmol/L (ref 96–106)
Creatinine, Ser: 0.74 mg/dL (ref 0.57–1.00)
Glucose: 111 mg/dL — ABNORMAL HIGH (ref 70–99)
Potassium: 3.6 mmol/L (ref 3.5–5.2)
Sodium: 140 mmol/L (ref 134–144)
eGFR: 95 mL/min/{1.73_m2} (ref >59)

## 2021-10-07 LAB — HEMOGLOBIN A1C
Est. average glucose Bld gHb Est-mCnc: 128 mg/dL
Hgb A1c MFr Bld: 6.1 % — ABNORMAL HIGH (ref 4.8–5.6)

## 2021-10-07 LAB — LIPID PANEL
Chol/HDL Ratio: 2.6 ratio (ref 0.0–4.4)
Cholesterol, Total: 192 mg/dL (ref 100–199)
HDL: 73 mg/dL (ref >39)
LDL Chol Calc (NIH): 97 mg/dL (ref 0–99)
Triglycerides: 130 mg/dL (ref 0–149)
VLDL Cholesterol Cal: 22 mg/dL (ref 5–40)

## 2021-10-07 LAB — CBC
Hematocrit: 40.2 % (ref 34.0–46.6)
Hemoglobin: 13.3 g/dL (ref 11.1–15.9)
MCH: 31.4 pg (ref 26.6–33.0)
MCHC: 33.1 g/dL (ref 31.5–35.7)
MCV: 95 fL (ref 79–97)
Platelets: 285 10*3/uL (ref 150–450)
RBC: 4.23 x10E6/uL (ref 3.77–5.28)
RDW: 12.6 % (ref 11.7–15.4)
WBC: 6.5 10*3/uL (ref 3.4–10.8)

## 2021-10-07 MED ORDER — METFORMIN HCL 500 MG PO TABS: ORAL_TABLET | ORAL | 1 refills | Status: DC

## 2021-10-07 NOTE — Patient Instructions (Signed)
Hypertension, Adult High blood pressure (hypertension) is when the force of blood pumping through the arteries is too strong. The arteries are the blood vessels that carry blood from the heart throughout the body. Hypertension forces the heart to work harder to pump blood and may cause arteries to become narrow or stiff. Untreated or uncontrolled hypertension can lead to a heart attack, heart failure, a stroke, kidney disease, and other problems. A blood pressure reading consists of a higher number over a lower number. Ideally, your blood pressure should be below 120/80. The first ("top") number is called the systolic pressure. It is a measure of the pressure in your arteries as your heart beats. The second ("bottom") number is called the diastolic pressure. It is a measure of the pressure in your arteries as the heart relaxes. What are the causes? The exact cause of this condition is not known. There are some conditions that result in high blood pressure. What increases the risk? Certain factors may make you more likely to develop high blood pressure. Some of these risk factors are under your control, including: Smoking. Not getting enough exercise or physical activity. Being overweight. Having too much fat, sugar, calories, or salt (sodium) in your diet. Drinking too much alcohol. Other risk factors include: Having a personal history of heart disease, diabetes, high cholesterol, or kidney disease. Stress. Having a family history of high blood pressure and high cholesterol. Having obstructive sleep apnea. Age. The risk increases with age. What are the signs or symptoms? High blood pressure may not cause symptoms. Very high blood pressure (hypertensive crisis) may cause: Headache. Fast or irregular heartbeats (palpitations). Shortness of breath. Nosebleed. Nausea and vomiting. Vision changes. Severe chest pain, dizziness, and seizures. How is this diagnosed? This condition is diagnosed by  measuring your blood pressure while you are seated, with your arm resting on a flat surface, your legs uncrossed, and your feet flat on the floor. The cuff of the blood pressure monitor will be placed directly against the skin of your upper arm at the level of your heart. Blood pressure should be measured at least twice using the same arm. Certain conditions can cause a difference in blood pressure between your right and left arms. If you have a high blood pressure reading during one visit or you have normal blood pressure with other risk factors, you may be asked to: Return on a different day to have your blood pressure checked again. Monitor your blood pressure at home for 1 week or longer. If you are diagnosed with hypertension, you may have other blood or imaging tests to help your health care provider understand your overall risk for other conditions. How is this treated? This condition is treated by making healthy lifestyle changes, such as eating healthy foods, exercising more, and reducing your alcohol intake. You may be referred for counseling on a healthy diet and physical activity. Your health care provider may prescribe medicine if lifestyle changes are not enough to get your blood pressure under control and if: Your systolic blood pressure is above 130. Your diastolic blood pressure is above 80. Your personal target blood pressure may vary depending on your medical conditions, your age, and other factors. Follow these instructions at home: Eating and drinking  Eat a diet that is high in fiber and potassium, and low in sodium, added sugar, and fat. An example of this eating plan is called the DASH diet. DASH stands for Dietary Approaches to Stop Hypertension. To eat this way: Eat   plenty of fresh fruits and vegetables. Try to fill one half of your plate at each meal with fruits and vegetables. Eat whole grains, such as whole-wheat pasta, brown rice, or whole-grain bread. Fill about one  fourth of your plate with whole grains. Eat or drink low-fat dairy products, such as skim milk or low-fat yogurt. Avoid fatty cuts of meat, processed or cured meats, and poultry with skin. Fill about one fourth of your plate with lean proteins, such as fish, chicken without skin, beans, eggs, or tofu. Avoid pre-made and processed foods. These tend to be higher in sodium, added sugar, and fat. Reduce your daily sodium intake. Many people with hypertension should eat less than 1,500 mg of sodium a day. Do not drink alcohol if: Your health care provider tells you not to drink. You are pregnant, may be pregnant, or are planning to become pregnant. If you drink alcohol: Limit how much you have to: 0-1 drink a day for women. 0-2 drinks a day for men. Know how much alcohol is in your drink. In the [place] one drink equals one 12 oz bottle of beer (355 mL), one 5 oz glass of wine (148 mL), or one 1 oz glass of hard liquor (44 mL). Lifestyle  Work with your health care provider to maintain a healthy body weight or to lose weight. Ask what an ideal weight is for you. Get at least 30 minutes of exercise that causes your heart to beat faster (aerobic exercise) most days of the week. Activities may include walking, swimming, or biking. Include exercise to strengthen your muscles (resistance exercise), such as Pilates or lifting weights, as part of your weekly exercise routine. Try to do these types of exercises for 30 minutes at least 3 days a week. Do not use any products that contain nicotine or tobacco. These products include cigarettes, chewing tobacco, and vaping devices, such as e-cigarettes. If you need help quitting, ask your health care provider. Monitor your blood pressure at home as told by your health care provider. Keep all follow-up visits. This is important. Medicines Take over-the-counter and prescription medicines only as told by your health care provider. Follow directions carefully. Blood  pressure medicines must be taken as prescribed. Do not skip doses of blood pressure medicine. Doing this puts you at risk for problems and can make the medicine less effective. Ask your health care provider about side effects or reactions to medicines that you should watch for. Contact a health care provider if you: Think you are having a reaction to a medicine you are taking. Have headaches that keep coming back (recurring). Feel dizzy. Have swelling in your ankles. Have trouble with your vision. Get help right away if you: Develop a severe headache or confusion. Have unusual weakness or numbness. Feel faint. Have severe pain in your chest or abdomen. Vomit repeatedly. Have trouble breathing. These symptoms may be an emergency. Get help right away. Call 911. Do not wait to see if the symptoms will go away. Do not drive yourself to the hospital. Summary Hypertension is when the force of blood pumping through your arteries is too strong. If this condition is not controlled, it may put you at risk for serious complications. Your personal target blood pressure may vary depending on your medical conditions, your age, and other factors. For most people, a normal blood pressure is less than 120/80. Hypertension is treated with lifestyle changes, medicines, or a combination of both. Lifestyle changes include losing weight, eating a healthy,   low-sodium diet, exercising more, and limiting alcohol. This information is not intended to replace advice given to you by your health care provider. Make sure you discuss any questions you have with your health care provider. Document Revised: 12/17/2020 Document Reviewed: 12/17/2020 Elsevier Patient Education  2023 [employer]  

## 2021-10-07 NOTE — Progress Notes (Signed)
I,Tianna Badgett,acting as a Education administrator for [employer], FNP.,have documented all relevant documentation on the behalf of [name], FNP,as directed by  [name], FNP while in the presence of [name], [employer].  Subjective:     Patient ID: Stephanie Garrison , female    DOB: 06/02/1965 , 56 y.o.   MRN: [id number]   Chief Complaint  Patient presents with   Hypertension    HPI  Pt here today for hypertension f/u.  She is to follow up with [facility] here in [place] on 8/24 for removal of the mass on the left kidney.  She is feeling "okay".   Hypertension This is a chronic problem. The current episode started more than 1 year ago. The problem is unchanged. The problem is controlled. Pertinent negatives include no chest pain, headaches, palpitations or shortness of breath. There are no associated agents to hypertension. Risk factors for coronary artery disease include obesity. Past treatments include diuretics and angiotensin blockers. The current treatment provides moderate improvement. Compliance problems include exercise and diet.  There is no history of angina or kidney disease. There is no history of chronic renal disease.     Past Medical History:  Diagnosis Date   GERD (gastroesophageal reflux disease)    OTC   Hypertension    Hypothyroidism    RBBB (right bundle branch block)    recurrent, asymptomatic   S/P CABG (coronary artery bypass graft) 1964   when she was 56 years old   S/P repair of tetralogy of Fallot    2D ECHO, 10/10/2009 - EF >55%, normal to mild   Thyroid disease    hypothyroidism     Family History  Problem Relation Age of Onset   Hyperlipidemia Mother    Breast cancer Cousin 56       maternal 1st cousin     Current Outpatient Medications:    amLODipine (NORVASC) 5 MG tablet, TAKE 1 TABLET DAILY, Disp: 90 tablet, Rfl: 3   levothyroxine (SYNTHROID) 50 MCG tablet, TAKE 1 TABLET DAILY, Disp: 90 tablet, Rfl: 3   metFORMIN (GLUCOPHAGE) 500 MG  tablet, Take 1 tab by mouth daily, Disp: 90 tablet, Rfl: 1   olmesartan-hydrochlorothiazide (BENICAR HCT) 40-25 MG tablet, TAKE 1 TABLET DAILY (NEED AN APPOINTMENT), Disp: 90 tablet, Rfl: 3   pravastatin (PRAVACHOL) 40 MG tablet, TAKE 1 TABLET DAILY, Disp: 90 tablet, Rfl: 3   Vitamin D, Ergocalciferol, (DRISDOL) 1.25 MG (50000 UNIT) CAPS capsule, TAKE 1 CAPSULE EVERY 7 DAYS, Disp: 12 capsule, Rfl: 3   No Known Allergies   Review of Systems  Constitutional: Negative.   Respiratory: Negative.  Negative for shortness of breath.   Cardiovascular:  Positive for leg swelling (bilateral ankle swelling worse after putting feet down on floor.). Negative for chest pain and palpitations.  Gastrointestinal: Negative.   Neurological: Negative.  Negative for headaches.  Psychiatric/Behavioral: Negative.       Today's Vitals   10/07/21 0922  BP: 128/70  Pulse: 88  Temp: 98.6 F (37 C)  TempSrc: Oral  Weight: 166 lb (75.3 kg)  Height: '5\' 2"'  (1.575 m)   Body mass index is 30.36 kg/m.  Wt Readings from Last 3 Encounters:  10/07/21 166 lb (75.3 kg)  09/17/21 169 lb 6.4 oz (76.8 kg)  07/29/21 168 lb 6.4 oz (76.4 kg)    Objective:  Physical Exam Vitals reviewed.  Constitutional:      General: She is not in acute distress.    Appearance: Normal appearance. She  is obese.  Cardiovascular:     Rate and Rhythm: Normal rate and regular rhythm.     Pulses: Normal pulses.     Heart sounds: Murmur heard.  Pulmonary:     Effort: Pulmonary effort is normal. No respiratory distress.     Breath sounds: Normal breath sounds. No wheezing.  Skin:    General: Skin is warm and dry.  Neurological:     General: No focal deficit present.     Mental Status: She is alert and oriented to person, place, and time.     Cranial Nerves: No cranial nerve deficit.  Psychiatric:        Mood and Affect: Mood normal.        Behavior: Behavior normal.        Thought Content: Thought content normal.        Judgment:  Judgment normal.         Assessment And Plan:     1. Essential hypertension Comments: Blood pressure is well controlled, continue current medications  2. Acquired hypothyroidism Comments: Stable,   3. Mixed hyperlipidemia Comments: Will check cholesterol levels today, had been slowly worsened at last visit - Lipid panel  4. Abnormal glucose She is only taking metformin once a day, made change to Rx, will check HgbA1c - metFORMIN (GLUCOPHAGE) 500 MG tablet; Take 1 tab by mouth daily  Dispense: 90 tablet; Refill: 1 - Hemoglobin A1c - BMP8+eGFR  5. Vitamin D deficiency Will check vitamin D level and supplement as needed.    Also encouraged to spend 15 minutes in the sun daily.   6. Left renal mass Comments: Has appt to discuss surgery optionson 8/24 with [facility]. Overall she is feeling "okay"  7. Class 1 obesity due to excess calories with serious comorbidity and body mass index (BMI) of 30.0 to 30.9 in adult She is encouraged to strive for BMI less than 30 to decrease cardiac risk. Advised to aim for at least 150 minutes of exercise per week.  8. Other long term (current) drug therapy - CBC   Patient was given opportunity to ask questions. Patient verbalized understanding of the plan and was able to repeat key elements of the plan. All questions were answered to their satisfaction.  [name], FNP   I, [name], FNP, have reviewed all documentation for this visit. The documentation on 10/07/21 for the exam, diagnosis, procedures, and orders are all accurate and complete.   IF YOU HAVE BEEN REFERRED TO A SPECIALIST, IT MAY TAKE 1-2 WEEKS TO SCHEDULE/PROCESS THE REFERRAL. IF YOU HAVE NOT HEARD FROM US/SPECIALIST IN TWO WEEKS, PLEASE GIVE [place] A CALL AT [phone] X [phone].   THE PATIENT IS ENCOURAGED TO PRACTICE SOCIAL DISTANCING DUE TO THE COVID-19 PANDEMIC.

## 2021-12-29 ENCOUNTER — Other Ambulatory Visit: Payer: Self-pay | Admitting: Nurse Practitioner

## 2021-12-31 ENCOUNTER — Telehealth: Payer: Self-pay | Admitting: Cardiovascular Disease

## 2021-12-31 ENCOUNTER — Other Ambulatory Visit: Payer: Self-pay | Admitting: Urology

## 2021-12-31 NOTE — Telephone Encounter (Signed)
   Pre-operative Risk Assessment    Patient Name: Stephanie Garrison  DOB: [birth year] MRN: [id number]      Request for Surgical Clearance    Procedure:   Robotic partial nephrectomy  Date of Surgery:  Clearance 03/13/22                                 Surgeon:  Dr. Ellison Hughs  Surgeon's Group or Practice Name:  [facility] Phone number:  [phone] Fax number:  [phone]   Type of Clearance Requested:   - Medical    Type of Anesthesia:  General    Additional requests/questions:    Dorthey Sawyer   12/31/2021, 3:32 PM

## 2021-12-31 NOTE — Telephone Encounter (Signed)
Left message to set up tele pre op appt

## 2021-12-31 NOTE — Telephone Encounter (Signed)
   Name: Stephanie Garrison  DOB: 01-26-1966  MRN: [id number]  Primary Cardiologist: Quay Burow, MD   Preoperative team, please contact this patient and set up a phone call appointment for further preoperative risk assessment. Please obtain consent and complete medication review. Thank you for your help.  I confirm that guidance regarding antiplatelet and oral anticoagulation therapy has been completed and, if necessary, noted below.(None requested)   Deberah Pelton, NP 12/31/2021, 4:13 PM [place]

## 2022-01-01 ENCOUNTER — Telehealth: Payer: Self-pay | Admitting: *Deleted

## 2022-01-01 NOTE — Telephone Encounter (Signed)
I s/w the pt and she has been scheduled for tele pre op appt 02/06/22 @ 10 am. Med rec and consent are done. Pt thanked me for the help and the call.     Patient Consent for Virtual Visit        Stephanie Garrison has provided verbal consent on 01/01/2022 for a virtual visit (video or telephone).   CONSENT FOR VIRTUAL VISIT FOR:  Stephanie Garrison  By participating in this virtual visit I agree to the following:  I hereby voluntarily request, consent and authorize [facility] and its employed or contracted physicians, physician assistants, nurse practitioners or other licensed health care professionals (the Practitioner), to provide me with telemedicine health care services (the "Services") as deemed necessary by the treating Practitioner. I acknowledge and consent to receive the Services by the Practitioner via telemedicine. I understand that the telemedicine visit will involve communicating with the Practitioner through live audiovisual communication technology and the disclosure of certain medical information by electronic transmission. I acknowledge that I have been given the opportunity to request an in-person assessment or other available alternative prior to the telemedicine visit and am voluntarily participating in the telemedicine visit.  I understand that I have the right to withhold or withdraw my consent to the use of telemedicine in the course of my care at any time, without affecting my right to future care or treatment, and that the Practitioner or I may terminate the telemedicine visit at any time. I understand that I have the right to inspect all information obtained and/or recorded in the course of the telemedicine visit and may receive copies of available information for a reasonable fee.  I understand that some of the potential risks of receiving the Services via telemedicine include:  Delay or interruption in medical evaluation due to technological equipment failure or  disruption; Information transmitted may not be sufficient (e.g. poor resolution of images) to allow for appropriate medical decision making by the Practitioner; and/or  In rare instances, security protocols could fail, causing a breach of personal health information.  Furthermore, I acknowledge that it is my responsibility to provide information about my medical history, conditions and care that is complete and accurate to the best of my ability. I acknowledge that Practitioner's advice, recommendations, and/or decision may be based on factors not within their control, such as incomplete or inaccurate data provided by me or distortions of diagnostic images or specimens that may result from electronic transmissions. I understand that the practice of medicine is not an exact science and that Practitioner makes no warranties or guarantees regarding treatment outcomes. I acknowledge that a copy of this consent can be made available to me via my patient portal ([facility]), or I can request a printed copy by calling the office of [facility].    I understand that my insurance will be billed for this visit.   I have read or had this consent read to me. I understand the contents of this consent, which adequately explains the benefits and risks of the Services being provided via telemedicine.  I have been provided ample opportunity to ask questions regarding this consent and the Services and have had my questions answered to my satisfaction. I give my informed consent for the services to be provided through the use of telemedicine in my medical care

## 2022-01-01 NOTE — Telephone Encounter (Signed)
I s/w the pt and she has been scheduled for tele pre op appt 02/06/22 @ 10 am. Med rec and consent are done. Pt thanked me for the help and the call.

## 2022-01-01 NOTE — Telephone Encounter (Signed)
Spoke with patient regarding scheduling a telephone visit for preop clearance. Informed her I would send message to Dr. Kennon Holter nurse for her to be scheduled.

## 2022-01-01 NOTE — Telephone Encounter (Signed)
Pt is returning call. Requesting call back.  

## 2022-01-01 NOTE — Telephone Encounter (Signed)
Patient was returning call. Please advise ?

## 2022-02-05 NOTE — Progress Notes (Signed)
Virtual Visit via Telephone Note   Because of Stephanie Garrison's co-morbid illnesses, she is at least at moderate risk for complications without adequate follow up.  This format is felt to be most appropriate for this patient at this time.  The patient did not have access to video technology/had technical difficulties with video requiring transitioning to audio format only (telephone).  All issues noted in this document were discussed and addressed.  No physical exam could be performed with this format.  Please refer to the patient's chart for her consent to telehealth for [facility].  Evaluation Performed:  Preoperative cardiovascular risk assessment _____________   Date:  02/05/2022   Patient ID:  Stephanie Garrison, DOB [birth year], MRN [id number] Patient Location:  Home Provider location:   Office  Primary Care Provider:  [name], FNP Primary Cardiologist:  Quay Burow, MD  Chief Complaint / Patient Profile   56 y.o. y/o female with a h/o repaired tetralogy of Fallot in the 1970s with normal LV function on echo 12/2020 and no valvular abnormalities or residual VSD, HTN, hyperlipidemia, RBBB who is pending robotic partial nephrectomy and presents today for telephonic preoperative cardiovascular risk assessment.  History of Present Illness    Stephanie Garrison is a 56 y.o. female who presents via audio/video conferencing for a telehealth visit today.  Pt was last seen in cardiology clinic on 07/29/21 by Dr. Gwenlyn Found.  At that time REJOICE HEATWOLE was doing well.  The patient is now pending procedure as outlined above. Since her last visit, she  denies chest pain, shortness of breath, lower extremity edema, fatigue, palpitations, melena, hematuria, hemoptysis, diaphoresis, weakness, presyncope, syncope, orthopnea, and PND. She does not exercise on a consistent basis but works for 12 hour shifts spending the majority of the time on her feet. Is able to achieve > 4 METS  activity on a consistent basis without cardiac symptoms.   Past Medical History    Past Medical History:  Diagnosis Date   GERD (gastroesophageal reflux disease)    OTC   Hypertension    Hypothyroidism    RBBB (right bundle branch block)    recurrent, asymptomatic   S/P CABG (coronary artery bypass graft) 1964   when she was 56 years old   S/P repair of tetralogy of Fallot    2D ECHO, 10/10/2009 - EF >55%, normal to mild   Thyroid disease    hypothyroidism   Past Surgical History:  Procedure Laterality Date   CARDIAC SURGERY  1970   repair Tetrology of Fallot   CESAREAN SECTION     COLONOSCOPY     DILATION AND CURETTAGE OF UTERUS     SHOULDER ARTHROSCOPY WITH SUBACROMIAL DECOMPRESSION, ROTATOR CUFF REPAIR AND BICEP TENDON REPAIR Right 09/20/2017   Procedure: RIGHT SHOULDER ARTHROSCOPY WITH DEBRIDEMENT EXTENSIVE, SUBACROMIAL DECOMPRESSION, ROTATOR CUFF REPAIR AND BICEP TENODESIS;  Surgeon: Hiram Gash, MD;  Location: [facility];  Service: Orthopedics;  Laterality: Right;    Allergies  No Known Allergies  Home Medications    Prior to Admission medications   Medication Sig Start Date End Date Taking? Authorizing Provider  amLODipine (NORVASC) 5 MG tablet TAKE 1 TABLET DAILY 07/10/21   [name], FNP  levothyroxine (SYNTHROID) 50 MCG tablet TAKE 1 TABLET DAILY 07/10/21   [name], FNP  metFORMIN (GLUCOPHAGE) 500 MG tablet Take 1 tab by mouth daily 10/07/21   [name], FNP  olmesartan-hydrochlorothiazide (BENICAR HCT) 40-25 MG tablet TAKE 1 TABLET DAILY (  NEED AN APPOINTMENT) 08/27/21   [name], FNP  pravastatin (PRAVACHOL) 40 MG tablet TAKE 1 TABLET DAILY 07/10/21   [name], FNP  Vitamin D, Ergocalciferol, (DRISDOL) 1.25 MG (50000 UNIT) CAPS capsule TAKE 1 CAPSULE EVERY 7 DAYS 12/29/21   [name], FNP    Physical Exam    Vital Signs:  LAUREL HARNDEN does not have vital signs available for review today.  Given telephonic nature of  communication, physical exam is limited. AAOx3. NAD. Normal affect.  Speech and respirations are unlabored.  Accessory Clinical Findings    None  Assessment & Plan    1.  Preoperative Cardiovascular Risk Assessment: The patient is doing well from a cardiac perspective. Therefore, based on ACC/AHA guidelines, the patient would be at acceptable risk for the planned procedure without further cardiovascular testing. According to the Revised Cardiac Risk Index (RCRI), her Perioperative Risk of Major Cardiac Event is (%): 0.9 Her Functional Capacity in METs is: 7.04 according to the [place] Activity Status Index (DASI).   The patient was advised that if she develops new symptoms prior to surgery to contact our office to arrange for a follow-up visit, and she verbalized understanding.   A copy of this note will be routed to requesting surgeon.  Time:   Today, I have spent 7 minutes with the patient with telehealth technology discussing medical history, symptoms, and management plan.    Emmaline Life, NP-C  02/05/2022, 4:09 PM [address], [address] Office [phone] Fax [phone]

## 2022-02-06 ENCOUNTER — Ambulatory Visit: Payer: 59 | Attending: Cardiovascular Disease | Admitting: Nurse Practitioner

## 2022-02-06 ENCOUNTER — Encounter: Payer: Self-pay | Admitting: Nurse Practitioner

## 2022-02-06 DIAGNOSIS — Z01810 Encounter for preprocedural cardiovascular examination: Secondary | ICD-10-CM | POA: Diagnosis not present

## 2022-02-26 NOTE — Patient Instructions (Addendum)
DUE TO COVID-19 ONLY TWO VISITORS  (aged 57 and older)  ARE ALLOWED TO COME WITH YOU AND STAY IN THE WAITING ROOM ONLY DURING PRE OP AND PROCEDURE.   **NO VISITORS ARE ALLOWED IN THE SHORT STAY AREA OR RECOVERY ROOM!!**  IF YOU WILL BE ADMITTED INTO THE HOSPITAL YOU ARE ALLOWED ONLY FOUR SUPPORT PEOPLE DURING VISITATION HOURS ONLY (7 AM -8PM)   The support person(s) must pass our screening, gel in and out, and wear a mask at all times, including in the patient's room. Patients must also wear a mask when staff or their support person are in the room. Visitors GUEST BADGE MUST BE WORN VISIBLY  One adult visitor may remain with you overnight and MUST be in the room by 8 P.M.     Your procedure is scheduled on: 03/13/22   Report to [facility] Main Entrance    Report to admitting at: 10:15 AM   Call this number if you have problems the morning of surgery [phone]  Clear liquids starting the day before surgery until : 9:30 AM DAY OF SURGERY.Drink plenty clear liquids the day before surgery.  Water Black Coffee (sugar ok, NO MILK/CREAM OR CREAMERS)  Tea (sugar ok, NO MILK/CREAM OR CREAMERS) regular and decaf                             Plain Jell-O (NO RED)                                           Fruit ices (not with fruit pulp, NO RED)                                     Popsicles (NO RED)                                                                  Juice: apple, WHITE grape, WHITE cranberry Sports drinks like Gatorade (NO RED)              FOLLOW BOWEL PREP AND ANY ADDITIONAL PRE OP INSTRUCTIONS YOU RECEIVED FROM YOUR SURGEON'S OFFICE!!!   Oral Hygiene is also important to reduce your risk of infection.                                    Remember - BRUSH YOUR TEETH THE MORNING OF SURGERY WITH YOUR REGULAR TOOTHPASTE  DENTURES WILL BE REMOVED PRIOR TO SURGERY PLEASE DO NOT APPLY "Poly grip" OR ADHESIVES!!!   Do NOT smoke after Midnight   Take these medicines the  morning of surgery with A SIP OF WATER: amlodipine,levothyroxine. How to Manage Your Diabetes Before and After Surgery  Why is it important to control my blood sugar before and after surgery? Improving blood sugar levels before and after surgery helps healing and can limit problems. A way of improving blood sugar control is eating a healthy diet by:  Eating less sugar and carbohydrates  Increasing activity/exercise  Talking with your doctor about reaching your blood sugar goals High blood sugars (greater than 180 mg/dL) can raise your risk of infections and slow your recovery, so you will need to focus on controlling your diabetes during the weeks before surgery. Make sure that the doctor who takes care of your diabetes knows about your planned surgery including the date and location.  How do I manage my blood sugar before surgery? Check your blood sugar at least 4 times a day, starting 2 days before surgery, to make sure that the level is not too high or low. Check your blood sugar the morning of your surgery when you wake up and every 2 hours until you get to the Short Stay unit. If your blood sugar is less than 70 mg/dL, you will need to treat for low blood sugar: Do not take insulin. Treat a low blood sugar (less than 70 mg/dL) with  cup of clear juice (cranberry or apple), 4 glucose tablets, OR glucose gel. Recheck blood sugar in 15 minutes after treatment (to make sure it is greater than 70 mg/dL). If your blood sugar is not greater than 70 mg/dL on recheck, call [phone] for further instructions. Report your blood sugar to the short stay nurse when you get to Short Stay.  If you are admitted to the hospital after surgery: Your blood sugar will be checked by the staff and you will probably be given insulin after surgery (instead of oral diabetes medicines) to make sure you have good blood sugar levels. The goal for blood sugar control after surgery is 80-180 mg/dL.   WHAT DO I  DO ABOUT MY DIABETES MEDICATION?  Do not take oral diabetes medicines (pills) the morning of surgery.  THE DAY BEFORE SURGERY, take metformin as usual.      THE MORNING OF SURGERY,DO NOT TAKE ANY ORAL DIABETIC MEDICATIONS DAY OF YOUR SURGERY  Bring CPAP mask and tubing day of surgery.                              You may not have any metal on your body including hair pins, jewelry, and body piercing             Do not wear make-up, lotions, powders, perfumes/cologne, or deodorant  Do not wear nail polish including gel and S&S, artificial/acrylic nails, or any other type of covering on natural nails including finger and toenails. If you have artificial nails, gel coating, etc. that needs to be removed by a nail salon please have this removed prior to surgery or surgery may need to be canceled/ delayed if the surgeon/ anesthesia feels like they are unable to be safely monitored.   Do not shave  48 hours prior to surgery.    Do not bring valuables to the hospital. [place].   Contacts, glasses, or bridgework may not be worn into surgery.   Bring small overnight bag day of surgery.   DO NOT [place]. PHARMACY WILL DISPENSE MEDICATIONS LISTED ON YOUR MEDICATION LIST TO YOU DURING YOUR ADMISSION [place]!    Patients discharged on the day of surgery will not be allowed to drive home.  Someone NEEDS to stay with you for the first 24 hours after anesthesia.   Special Instructions: Bring a  copy of your healthcare power of attorney and living will documents         the day of surgery if you haven't scanned them before.              Please read over the following fact sheets you were given: IF YOU HAVE QUESTIONS ABOUT YOUR PRE-OP INSTRUCTIONS PLEASE CALL [phone]    DUE TO COVID-19 ONLY TWO VISITORS  (aged 80 and older)  ARE ALLOWED TO COME WITH YOU AND STAY IN THE WAITING ROOM ONLY DURING PRE OP  AND PROCEDURE.   **NO VISITORS ARE ALLOWED IN THE SHORT STAY AREA OR RECOVERY ROOM!!**  IF YOU WILL BE ADMITTED INTO THE HOSPITAL YOU ARE ALLOWED ONLY FOUR SUPPORT PEOPLE DURING VISITATION HOURS ONLY (7 AM -8PM)   The support person(s) must pass our screening, gel in and out, and wear a mask at all times, including in the patient's room. Patients must also wear a mask when staff or their support person are in the room. Visitors GUEST BADGE MUST BE WORN VISIBLY  One adult visitor may remain with you overnight and MUST be in the room by 8 P.M.     [facility] - Preparing for Surgery Before surgery, you can play an important role.  Because skin is not sterile, your skin needs to be as free of germs as possible.  You can reduce the number of germs on your skin by washing with CHG (chlorahexidine gluconate) soap before surgery.  CHG is an antiseptic cleaner which kills germs and bonds with the skin to continue killing germs even after washing. Please DO NOT use if you have an allergy to CHG or antibacterial soaps.  If your skin becomes reddened/irritated stop using the CHG and inform your nurse when you arrive at Short Stay. Do not shave (including legs and underarms) for at least 48 hours prior to the first CHG shower.  You may shave your face/neck. Please follow these instructions carefully:  1.  Shower with CHG Soap the night before surgery and the  morning of Surgery.  2.  If you choose to wash your hair, wash your hair first as usual with your  normal  shampoo.  3.  After you shampoo, rinse your hair and body thoroughly to remove the  shampoo.                           4.  Use CHG as you would any other liquid soap.  You can apply chg directly  to the skin and wash                       Gently with a scrungie or clean washcloth.  5.  Apply the CHG Soap to your body ONLY FROM THE NECK DOWN.   Do not use on face/ open                           Wound or open sores. Avoid contact with eyes, ears mouth  and genitals (private parts).                       Wash face,  Genitals (private parts) with your normal soap.             6.  Wash thoroughly, paying special attention to the area where your surgery  will be performed.  7.  Thoroughly rinse your body with warm water from the neck down.  8.  DO NOT shower/wash with your normal soap after using and rinsing off  the CHG Soap.                9.  Pat yourself dry with a clean towel.            10.  Wear clean pajamas.            11.  Place clean sheets on your bed the night of your first shower and do not  sleep with pets. Day of Surgery : Do not apply any lotions/deodorants the morning of surgery.  Please wear clean clothes to the hospital/surgery center.  FAILURE TO FOLLOW THESE INSTRUCTIONS MAY RESULT IN THE CANCELLATION OF YOUR SURGERY PATIENT SIGNATURE_________________________________  NURSE SIGNATURE__________________________________  ________________________________________________________________________ WHAT IS A BLOOD TRANSFUSION? Blood Transfusion Information  A transfusion is the replacement of blood or some of its parts. Blood is made up of multiple cells which provide different functions. Red blood cells carry oxygen and are used for blood loss replacement. White blood cells fight against infection. Platelets control bleeding. Plasma helps clot blood. Other blood products are available for specialized needs, such as hemophilia or other clotting disorders. BEFORE THE TRANSFUSION  Who gives blood for transfusions?  Healthy volunteers who are fully evaluated to make sure their blood is safe. This is blood bank blood. Transfusion therapy is the safest it has ever been in the practice of medicine. Before blood is taken from a donor, a complete history is taken to make sure that person has no history of diseases nor engages in risky social behavior (examples are intravenous drug use or sexual activity with multiple partners). The  donor's travel history is screened to minimize risk of transmitting infections, such as malaria. The donated blood is tested for signs of infectious diseases, such as HIV and hepatitis. The blood is then tested to be sure it is compatible with you in order to minimize the chance of a transfusion reaction. If you or a relative donates blood, this is often done in anticipation of surgery and is not appropriate for emergency situations. It takes many days to process the donated blood. RISKS AND COMPLICATIONS Although transfusion therapy is very safe and saves many lives, the main dangers of transfusion include:  Getting an infectious disease. Developing a transfusion reaction. This is an allergic reaction to something in the blood you were given. Every precaution is taken to prevent this. The decision to have a blood transfusion has been considered carefully by your caregiver before blood is given. Blood is not given unless the benefits outweigh the risks. AFTER THE TRANSFUSION Right after receiving a blood transfusion, you will usually feel much better and more energetic. This is especially true if your red blood cells have gotten low (anemic). The transfusion raises the level of the red blood cells which carry oxygen, and this usually causes an energy increase. The nurse administering the transfusion will monitor you carefully for complications. HOME CARE INSTRUCTIONS  No special instructions are needed after a transfusion. You may find your energy is better. Speak with your caregiver about any limitations on activity for underlying diseases you may have. SEEK MEDICAL CARE IF:  Your condition is not improving after your transfusion. You develop redness or irritation at the intravenous (IV) site. SEEK IMMEDIATE MEDICAL CARE IF:  Any of the following symptoms occur over the next 12 hours: Shaking chills. You have a  temperature by mouth above 102 F (38.9 C), not controlled by medicine. Chest, back,  or muscle pain. People around you feel you are not acting correctly or are confused. Shortness of breath or difficulty breathing. Dizziness and fainting. You get a rash or develop hives. You have a decrease in urine output. Your urine turns a dark color or changes to pink, red, or brown. Any of the following symptoms occur over the next 10 days: You have a temperature by mouth above 102 F (38.9 C), not controlled by medicine. Shortness of breath. Weakness after normal activity. The white part of the eye turns yellow (jaundice). You have a decrease in the amount of urine or are urinating less often. Your urine turns a dark color or changes to pink, red, or brown. Document Released: 02/07/2000 Document Revised: 05/04/2011 Document Reviewed: 09/26/2007 [facility] Patient Information 2014 [place], [place].  _______________________________________________________________________

## 2022-02-27 ENCOUNTER — Encounter (HOSPITAL_COMMUNITY): Payer: Self-pay

## 2022-02-27 ENCOUNTER — Other Ambulatory Visit: Payer: Self-pay

## 2022-02-27 ENCOUNTER — Encounter (HOSPITAL_COMMUNITY)
Admission: RE | Admit: 2022-02-27 | Discharge: 2022-02-27 | Disposition: A | Discharge status: Home / Self Care | Payer: 59 | Source: Ambulatory Visit | Attending: Urology | Admitting: Urology

## 2022-02-27 VITALS — BP 131/98 | HR 76 | Temp 98.3°F | Ht 62.0 in | Wt 173.0 lb

## 2022-02-27 DIAGNOSIS — Z8774 Personal history of (corrected) congenital malformations of heart and circulatory system: Secondary | ICD-10-CM | POA: Insufficient documentation

## 2022-02-27 DIAGNOSIS — I10 Essential (primary) hypertension: Secondary | ICD-10-CM | POA: Diagnosis not present

## 2022-02-27 DIAGNOSIS — Z951 Presence of aortocoronary bypass graft: Secondary | ICD-10-CM | POA: Diagnosis not present

## 2022-02-27 DIAGNOSIS — Z01818 Encounter for other preprocedural examination: Secondary | ICD-10-CM | POA: Diagnosis present

## 2022-02-27 DIAGNOSIS — N2889 Other specified disorders of kidney and ureter: Secondary | ICD-10-CM | POA: Insufficient documentation

## 2022-02-27 DIAGNOSIS — R7303 Prediabetes: Secondary | ICD-10-CM | POA: Diagnosis not present

## 2022-02-27 DIAGNOSIS — E782 Mixed hyperlipidemia: Secondary | ICD-10-CM

## 2022-02-27 HISTORY — DX: Cardiac murmur, unspecified: R01.1

## 2022-02-27 HISTORY — DX: Prediabetes: R73.03

## 2022-02-27 HISTORY — DX: Unspecified osteoarthritis, unspecified site: M19.90

## 2022-02-27 LAB — BASIC METABOLIC PANEL
Anion gap: 11 (ref 5–15)
BUN: 21 mg/dL — ABNORMAL HIGH (ref 6–20)
CO2: 26 mmol/L (ref 22–32)
Calcium: 9.4 mg/dL (ref 8.9–10.3)
Chloride: 97 mmol/L — ABNORMAL LOW (ref 98–111)
Creatinine, Ser: 0.63 mg/dL (ref 0.44–1.00)
GFR, Estimated: >60 mL/min (ref >60)
Glucose, Bld: 101 mg/dL — ABNORMAL HIGH (ref 70–99)
Potassium: 3.3 mmol/L — ABNORMAL LOW (ref 3.5–5.1)
Sodium: 134 mmol/L — ABNORMAL LOW (ref 135–145)

## 2022-02-27 LAB — GLUCOSE, CAPILLARY: Glucose-Capillary: 111 mg/dL — ABNORMAL HIGH (ref 70–99)

## 2022-02-27 LAB — TYPE AND SCREEN
ABO/RH(D): O POS
Antibody Screen: NEGATIVE

## 2022-02-27 LAB — CBC
HCT: 38.5 % (ref 36.0–46.0)
Hemoglobin: 12.5 g/dL (ref 12.0–15.0)
MCH: 30.6 pg (ref 26.0–34.0)
MCHC: 32.5 g/dL (ref 30.0–36.0)
MCV: 94.4 fL (ref 80.0–100.0)
Platelets: 248 10*3/uL (ref 150–400)
RBC: 4.08 MIL/uL (ref 3.87–5.11)
RDW: 12.6 % (ref 11.5–15.5)
WBC: 6.2 10*3/uL (ref 4.0–10.5)
nRBC: 0 % (ref 0.0–0.2)

## 2022-02-27 NOTE — Progress Notes (Signed)
For Short Stay: [place] appointment date:  Bowel Prep reminder: N/A   For Anesthesia: PCP - [name]: FNP Cardiologist - Dr. Quay Burow Clearance: Lanice Schwab Swinyer: NP-C Chest x-ray - 09/18/21 EKG - 07/29/21 Stress Test -  ECHO - 01/09/21 Cardiac Cath -  Pacemaker/ICD device last checked: Pacemaker orders received: Device Rep notified:  Spinal Cord Stimulator:  Sleep Study -  CPAP -   Fasting Blood Sugar - N/A Checks Blood Sugar __0___ times a day Date and result of last Hgb A1c-  Last dose of GLP1 agonist-  GLP1 instructions:   Last dose of SGLT-2 inhibitors-  SGLT-2 instructions:   Blood Thinner Instructions: Aspirin Instructions: Last Dose:  Activity level: Can go up a flight of stairs and activities of daily living without stopping and without chest pain and/or shortness of breath   Able to exercise without chest pain and/or shortness of breath    Anesthesia review: Hx: HTN,Pre-DIA,Rt. BBB,CABG  Patient denies shortness of breath, fever, cough and chest pain at PAT appointment   Patient verbalized understanding of instructions that were given to them at the PAT appointment. Patient was also instructed that they will need to review over the PAT instructions again at home before surgery.

## 2022-02-28 LAB — HEMOGLOBIN A1C
Hgb A1c MFr Bld: 6.2 % — ABNORMAL HIGH (ref 4.8–5.6)
Mean Plasma Glucose: 131 mg/dL

## 2022-03-03 NOTE — Anesthesia Preprocedure Evaluation (Addendum)
Anesthesia Evaluation  Patient identified by MRN, date of birth, ID band Patient awake    Reviewed: Allergy & Precautions, H&P , NPO status , Patient's Chart, lab work & pertinent test results  Airway Mallampati: II  TM Distance: >3 FB Neck ROM: Full    Dental no notable dental hx. (+) Teeth Intact, Dental Advisory Given   Pulmonary neg pulmonary ROS   Pulmonary exam normal breath sounds clear to auscultation       Cardiovascular hypertension, Pt. on medications + dysrhythmias  Rhythm:Regular Rate:Normal     Neuro/Psych negative neurological ROS  negative psych ROS   GI/Hepatic Neg liver ROS,GERD  ,,  Endo/Other  diabetesHypothyroidism    Renal/GU Renal disease  negative genitourinary   Musculoskeletal  (+) Arthritis , Osteoarthritis,    Abdominal   Peds  Hematology negative hematology ROS (+)   Anesthesia Other Findings   Reproductive/Obstetrics negative OB ROS                             Anesthesia Physical Anesthesia Plan  ASA: 2  Anesthesia Plan: General   Post-op Pain Management: Tylenol PO (pre-op)*   Induction: Intravenous  PONV Risk Score and Plan: 4 or greater and Ondansetron, Dexamethasone and Midazolam  Airway Management Planned: Oral ETT  Additional Equipment:   Intra-op Plan:   Post-operative Plan: Extubation in OR  Informed Consent: I have reviewed the patients History and Physical, chart, labs and discussed the procedure including the risks, benefits and alternatives for the proposed anesthesia with the patient or authorized representative who has indicated his/her understanding and acceptance.     Dental advisory given  Plan Discussed with: CRNA  Anesthesia Plan Comments: (See PAT note 02/27/2022)       Anesthesia Quick Evaluation

## 2022-03-03 NOTE — Progress Notes (Signed)
Anesthesia Chart Review   Case: [id number] Date/Time: 03/13/22 1230   Procedure: XI ROBOTIC ASSITED PARTIAL NEPHRECTOMY (Left)   Anesthesia type: General   Pre-op diagnosis: LEFT RENAL MASS   Location: [facility] ROOM 03 / [facility]   Surgeons: Ceasar Mons, MD       DISCUSSION:57 y.o. never smoker with h/o HTN, RBBB, s/p repair of tetralogy of fallot, s/p CABG, left renal mass scheduled for above procedure 03/13/2022 with Dr. Harrell Gave Stephanie [facility].   Pt last seen by cardiology 02/06/2022. Per OV note, "Preoperative Cardiovascular Risk Assessment: The patient is doing well from a cardiac perspective. Therefore, based on ACC/AHA guidelines, the patient would be at acceptable risk for the planned procedure without further cardiovascular testing. According to the Revised Cardiac Risk Index (RCRI), her Perioperative Risk of Major Cardiac Event is (%): 0.9 Her Functional Capacity in METs is: 7.04 according to the Duke Activity Status Index (DASI)."  Anticipate pt can proceed with planned procedure barring acute status change.   VS: BP (!) 131/98   Pulse 76   Temp 36.8 C (Oral)   Ht '5\' 2"'$  (1.575 m)   Wt 78.5 kg   SpO2 100%   BMI 31.64 kg/m   PROVIDERS: Stephanie Brine, FNP is PCP   Primary Cardiologist:  Stephanie Burow, MD  LABS: Labs reviewed: Acceptable for surgery. (all labs ordered are listed, but only abnormal results are displayed)  Labs Reviewed  BASIC METABOLIC PANEL - Abnormal; Notable for the following components:      Result Value   Sodium 134 (*)    Potassium 3.3 (*)    Chloride 97 (*)    Glucose, Bld 101 (*)    BUN 21 (*)    All other components within normal limits  HEMOGLOBIN A1C - Abnormal; Notable for the following components:   Hgb A1c MFr Bld 6.2 (*)    All other components within normal limits  GLUCOSE, CAPILLARY - Abnormal; Notable for the following components:   Glucose-Capillary 111 (*)    All other components within normal limits  CBC  TYPE AND SCREEN      IMAGES:   EKG:   CV: Echo 01/09/2021  1. No residual VSD noted. Left ventricular ejection fraction, by  estimation, is 55 to 60%. The left ventricle has normal function. The left  ventricle has no regional wall motion abnormalities. Left ventricular  diastolic parameters are consistent with  Grade I diastolic dysfunction (impaired relaxation).   2. Right ventricular systolic function is normal. The right ventricular  size is normal. There is normal pulmonary artery systolic pressure. The  estimated right ventricular systolic pressure is 72.5 mmHg.   3. The mitral valve is normal in structure. No evidence of mitral valve  regurgitation. No evidence of mitral stenosis.   4. The aortic valve is tricuspid. Aortic valve regurgitation is trivial.  No aortic stenosis is present.   5. Aortic dilatation noted. There is mild dilatation of the ascending  aorta, measuring 43 mm.   6. The inferior vena cava is normal in size with greater than 50%  respiratory variability, suggesting right atrial pressure of 3 mmHg.   7. History of repaired Tetralogy of Fallot. No residual VSD, only trivial  pulmonic insufficiency, and the right ventricle, though poorly visualized,  looks relatively normal.  Past Medical History:  Diagnosis Date   Arthritis    GERD (gastroesophageal reflux disease)    OTC   Heart murmur    Hypertension    Hypothyroidism  Pre-diabetes    RBBB (right bundle branch block)    recurrent, asymptomatic   S/P CABG (coronary artery bypass graft) 1964   when she was 57 years old   S/P repair of tetralogy of Fallot    2D ECHO, 10/10/2009 - EF >55%, normal to mild   Thyroid disease    hypothyroidism    Past Surgical History:  Procedure Laterality Date   CARDIAC SURGERY  1970   repair Tetrology of Fallot   CESAREAN SECTION     COLONOSCOPY     CORONARY ARTERY BYPASS GRAFT     DILATION AND CURETTAGE OF UTERUS     SHOULDER ARTHROSCOPY WITH SUBACROMIAL DECOMPRESSION,  ROTATOR CUFF REPAIR AND BICEP TENDON REPAIR Right 09/20/2017   Procedure: RIGHT SHOULDER ARTHROSCOPY WITH DEBRIDEMENT EXTENSIVE, SUBACROMIAL DECOMPRESSION, ROTATOR CUFF REPAIR AND BICEP TENODESIS;  Surgeon: Hiram Gash, MD;  Location: [facility];  Service: Orthopedics;  Laterality: Right;    MEDICATIONS:  bisacodyl (DULCOLAX) 5 MG EC tablet   famotidine (PEPCID) 20 MG tablet   acetaminophen (TYLENOL) 500 MG tablet   acetaminophen (TYLENOL) 650 MG CR tablet   amLODipine (NORVASC) 5 MG tablet   levothyroxine (SYNTHROID) 50 MCG tablet   metFORMIN (GLUCOPHAGE) 500 MG tablet   olmesartan-hydrochlorothiazide (BENICAR HCT) 40-25 MG tablet   pravastatin (PRAVACHOL) 40 MG tablet   Vitamin D, Ergocalciferol, (DRISDOL) 1.25 MG (50000 UNIT) CAPS capsule   No current facility-administered medications for this encounter.     Stephanie Felix Ward, PA-C [facility] Pre-Surgical Testing [phone]

## 2022-03-12 NOTE — H&P (Addendum)
Office Visit Report     03/03/2022   --------------------------------------------------------------------------------   Stephanie Garrison  MRN: [id number]  DOB: 1965-06-03, 57 year old Female   PRIMARY CARE:  [name], [place]  PRIMARY CARE FAX:  [phone]  REFERRING:  Harrell Gave A. Lovena Neighbours, MD  PROVIDER:  Link Snuffer, III, M.D.  TREATING:  Mcarthur Rossetti, [place]  LOCATION:  [facility] [phone]     --------------------------------------------------------------------------------   CC/HPI: Pt presents today for pre-operative history and physical exam in anticipation of robotic assisted lap left partial nephrectomy by Dr. Lovena Neighbours on 03/13/22. She is doing well and is without complaint. Her cardiologist is Dr. Gwenlyn Found and she sees him on a regular basis.   Pt denies F/C, HA, CP, SOB, N/V, diarrhea/constipation, back pain, flank pain, hematuria, and dysuria.   HX:   Renal mass   Stephanie Garrison is a 57 female with a solid and enhancing renal mass measuring 3.6 cm involving the lower pole of the LEFT kidney.   -The mass was initially discovered on RUS during an evaluation of microscopic hematuria (negative cysto in 2017) and further characterized via MRI.  -Anatomy: mesophytic left lower pole mass with likely extension into the renal sinus fat  -Personal/family history of GU malignancies: denies  -PMHx of teratology fallot --s/p open heart surgery in 1970--currently followed by Dr. Quay Burow  -Smoking history: denies  -Prior abdominal surgeries: c-section  -Renal function: Serum creatinine was 0.74 with a eGFR of 95 on BMP from 09/2021  -History of kidney stones: denies  -She already has a cruise vacation planned for Christmas and would like to wait until after the holidays for surgery.     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 Microscopic hematuria - 10/16/2021, - 2017 Nocturia - 2017      PMH Notes: high blood pressure  hypothyroidism   NON-GU PMH: Anxiety Cardiac murmur, unspecified Encounter for general adult medical examination without abnormal findings, Encounter for preventive health examination Heartburn    FAMILY HISTORY: 1 Daughter - Other Breast Cancer - Cousin Diabetes - Cousin Gout - Brother High Blood Pressure - Runs in Family Patient's father is deceased - Other Patient's mother is still living 74 - Other Prostate Cancer - Grandfather    Notes: mother is 24   SOCIAL HISTORY: Marital Status: Married Preferred Language: English; Race: Black or African American Current Smoking Status: Patient has never smoked.  Drinks 6 drinks per week.  Does not use drugs. Drinks 1 caffeinated drink per day. Has not had a blood transfusion. Patient's occupation Engineer, maintenance.     Notes: ETOH beer 12 pack each sitting a couple times a week    REVIEW OF SYSTEMS:    GU Review Female:   Patient denies frequent urination, hard to postpone urination, burning /pain with urination, get up at night to urinate, leakage of urine, stream starts and stops, trouble starting your stream, have to strain to urinate, and being pregnant.  Gastrointestinal (Upper):   Patient denies  nausea, vomiting, and indigestion/ heartburn.  Gastrointestinal (Lower):   Patient denies diarrhea and constipation.  Constitutional:   Patient denies fever, night sweats, weight loss, and fatigue.  Skin:   Patient denies skin rash/ lesion and itching.  Eyes:   Patient denies blurred vision and double vision.  Ears/ Nose/ Throat:    Patient denies sinus problems and sore throat.  Hematologic/Lymphatic:   Patient denies swollen glands and easy bruising.  Cardiovascular:   Patient denies leg swelling and chest pains.  Respiratory:   Patient denies cough and shortness of breath.  Endocrine:   Patient denies excessive thirst.  Musculoskeletal:   Patient denies back pain and joint pain.  Neurological:   Patient denies headaches and dizziness.  Psychologic:   Patient denies depression and anxiety.   VITAL SIGNS:      03/03/2022 01:13 PM  Weight 170 lb / 77.11 kg  Height 62 in / 157.48 cm  BP 115/81 mmHg  Pulse 93 /min  Temperature 97.8 F / 36.5 C  BMI 31.1 kg/m   MULTI-SYSTEM PHYSICAL EXAMINATION:    Constitutional: Well-nourished. No physical deformities. Normally developed. Good grooming.  Neck: Neck symmetrical, not swollen. Normal tracheal position.  Respiratory: Normal breath sounds. No labored breathing, no use of accessory muscles.   Cardiovascular: Regular rate and rhythm. II/VI sys murmur, no gallop.   Lymphatic: No enlargement of neck, axillae, groin.  Skin: No paleness, no jaundice, no cyanosis. No lesion, no ulcer, no rash.  Neurologic / Psychiatric: Oriented to time, oriented to place, oriented to person. No depression, no anxiety, no agitation.  Gastrointestinal: No mass, no tenderness, no rigidity, obese abdomen.   Eyes: Normal conjunctivae. Normal eyelids.  Ears, Nose, Mouth, and Throat: Left ear no scars, no lesions, no masses. Right ear no scars, no lesions, no masses. Nose no scars, no lesions, no masses. Normal hearing. Normal lips.  Musculoskeletal: Normal gait and station of head and neck.     Complexity of Data:  Records Review:   Previous Patient Records  Urine Test Review:   Urinalysis   03/03/22  Urinalysis  Urine Appearance Clear   Urine Color Yellow   Urine Glucose Neg mg/dL  Urine Bilirubin Neg mg/dL  Urine Ketones Neg mg/dL  Urine Specific Gravity 1.020   Urine Blood 3+ ery/uL   Urine pH 6.5   Urine Protein Neg mg/dL  Urine Urobilinogen 0.2 mg/dL  Urine Nitrites Neg   Urine Leukocyte Esterase Neg leu/uL  Urine WBC/hpf 0 - 5/hpf   Urine RBC/hpf 10 - 20/hpf   Urine Epithelial Cells 0 - 5/hpf   Urine Bacteria Few (10-25/hpf)   Urine Mucous Not Present   Urine Yeast NS (Not Seen)   Urine Trichomonas Not Present   Urine Cystals NS (Not Seen)   Urine Casts NS (Not Seen)   Urine Sperm Not Present    PROCEDURES:          Urinalysis w/Scope - 81001 Dipstick Dipstick Cont'd Micro  Color: Yellow Bilirubin: Neg mg/dL WBC/hpf: 0 - 5/hpf  Appearance: Clear Ketones: Neg mg/dL RBC/hpf: 10 - 20/hpf  Specific Gravity: 1.020 Blood: 3+ ery/uL Bacteria: Few (10-25/hpf)  pH: 6.5 Protein: Neg mg/dL Cystals: NS (Not Seen)  Glucose: Neg mg/dL Urobilinogen: 0.2 mg/dL Casts: NS (Not Seen)    Nitrites: Neg Trichomonas: Not Present    Leukocyte Esterase: Neg leu/uL Mucous: Not Present      Epithelial Cells: 0 - 5/hpf      Yeast: NS (Not Seen)  Sperm: Not Present    ASSESSMENT:      ICD-10 Details  1 GU:   Left renal neoplasm - D49.512    PLAN:           Orders Labs Urine Culture          Schedule Return Visit/Planned Activity: Keep Scheduled Appointment - Schedule Surgery          Document Letter(s):  Created for Patient: Clinical Summary         Notes:   There are no changes in the patients history or physical exam since last evaluation by Dr. Lovena Neighbours. Pt is scheduled to undergo RAL left partial nephrectomy on 03/13/22.   Send urine for culture to r/o infection.   All pt's questions were answered to the best of my ability.     -I personally reviewed imaging results and films with the patient. We discussed that the mass in question has features concerning for malignancy. I explained the natural history of presumed renal cell carcinoma. I reviewed the AUA guidelines for evaluation and treatment of the small renal mass. The options of active surveillance, in  situ tumor ablation, partial and radical nephrectomy was discussed. The risks of robot-assisted LEFT partial nephrectomy were discussed in detail including but not limited to: negative pathology, open conversion, completion nephrectomy, infection of the urinary tract/skin/abdominal cavity, VTE, MI/CVA, lymphatic leak, injury to adjacent solid/hollow viscus organs, bleeding requiring a blood transfusion, catastrophic bleeding, hernia formation, need for postoperative angioembolization, urinary leak requiring stent/drain, and other imponderables.

## 2022-03-13 ENCOUNTER — Observation Stay (HOSPITAL_COMMUNITY)
Admission: RE | Admit: 2022-03-13 | Discharge: 2022-03-14 | Disposition: A | Discharge status: Home / Self Care | Payer: 59 | Source: Ambulatory Visit | Attending: Urology | Admitting: Urology

## 2022-03-13 ENCOUNTER — Other Ambulatory Visit: Payer: Self-pay

## 2022-03-13 ENCOUNTER — Ambulatory Visit (HOSPITAL_COMMUNITY): Payer: 59 | Admitting: Physician Assistant

## 2022-03-13 ENCOUNTER — Encounter (HOSPITAL_COMMUNITY)
Admission: RE | Disposition: A | Discharge status: Home / Self Care | Payer: Self-pay | Source: Ambulatory Visit | Attending: Urology

## 2022-03-13 ENCOUNTER — Ambulatory Visit (HOSPITAL_BASED_OUTPATIENT_CLINIC_OR_DEPARTMENT_OTHER): Payer: 59 | Admitting: Anesthesiology

## 2022-03-13 ENCOUNTER — Encounter (HOSPITAL_COMMUNITY): Payer: Self-pay | Admitting: Urology

## 2022-03-13 DIAGNOSIS — N2889 Other specified disorders of kidney and ureter: Principal | ICD-10-CM | POA: Diagnosis present

## 2022-03-13 DIAGNOSIS — Z01818 Encounter for other preprocedural examination: Secondary | ICD-10-CM

## 2022-03-13 DIAGNOSIS — I10 Essential (primary) hypertension: Secondary | ICD-10-CM | POA: Insufficient documentation

## 2022-03-13 DIAGNOSIS — E039 Hypothyroidism, unspecified: Secondary | ICD-10-CM | POA: Insufficient documentation

## 2022-03-13 DIAGNOSIS — Z79899 Other long term (current) drug therapy: Secondary | ICD-10-CM | POA: Insufficient documentation

## 2022-03-13 DIAGNOSIS — Z7984 Long term (current) use of oral hypoglycemic drugs: Secondary | ICD-10-CM | POA: Insufficient documentation

## 2022-03-13 DIAGNOSIS — E119 Type 2 diabetes mellitus without complications: Secondary | ICD-10-CM | POA: Diagnosis not present

## 2022-03-13 DIAGNOSIS — Z951 Presence of aortocoronary bypass graft: Secondary | ICD-10-CM | POA: Insufficient documentation

## 2022-03-13 HISTORY — DX: Type 2 diabetes mellitus without complications: E11.9

## 2022-03-13 HISTORY — PX: ROBOTIC ASSITED PARTIAL NEPHRECTOMY: SHX6087

## 2022-03-13 LAB — HEMOGLOBIN AND HEMATOCRIT, BLOOD
HCT: 43 % (ref 36.0–46.0)
Hemoglobin: 13.8 g/dL (ref 12.0–15.0)

## 2022-03-13 LAB — GLUCOSE, CAPILLARY
Glucose-Capillary: 105 mg/dL — ABNORMAL HIGH (ref 70–99)
Glucose-Capillary: 132 mg/dL — ABNORMAL HIGH (ref 70–99)
Glucose-Capillary: 151 mg/dL — ABNORMAL HIGH (ref 70–99)

## 2022-03-13 LAB — POCT PREGNANCY, URINE: Preg Test, Ur: NEGATIVE

## 2022-03-13 SURGERY — NEPHRECTOMY, PARTIAL, ROBOT-ASSISTED
Anesthesia: General | Laterality: Left

## 2022-03-13 MED ORDER — HYOSCYAMINE SULFATE 0.125 MG SL SUBL: 0.1250 mg | SUBLINGUAL_TABLET | SUBLINGUAL | Status: DC | PRN

## 2022-03-13 MED ORDER — CHLORHEXIDINE GLUCONATE 0.12 % MT SOLN
15.0000 mL | Freq: Once | OROMUCOSAL | Status: AC
  Administered 2022-03-13: 15 mL via OROMUCOSAL

## 2022-03-13 MED ORDER — HYDROMORPHONE HCL 1 MG/ML IJ SOLN
0.5000 mg | INTRAMUSCULAR | Status: DC | PRN
  Administered 2022-03-14: 1 mg via INTRAVENOUS
  Filled 2022-03-13: qty 1

## 2022-03-13 MED ORDER — HYDROCODONE-ACETAMINOPHEN 5-325 MG PO TABS: 1.0000 | ORAL_TABLET | Freq: Four times a day (QID) | ORAL | 0 refills | Status: DC | PRN

## 2022-03-13 MED ORDER — KETAMINE HCL 10 MG/ML IJ SOLN
INTRAMUSCULAR | Status: DC | PRN
  Administered 2022-03-13: 25 mg via INTRAVENOUS

## 2022-03-13 MED ORDER — "VISTASEAL 4 ML SINGLE DOSE KIT "
4.0000 mL | PACK | Freq: Once | CUTANEOUS | Status: AC
  Administered 2022-03-13: 4 mL via TOPICAL
  Filled 2022-03-13: qty 4

## 2022-03-13 MED ORDER — OXYCODONE HCL 5 MG PO TABS
5.0000 mg | ORAL_TABLET | ORAL | Status: DC | PRN
  Administered 2022-03-13 – 2022-03-14 (×3): 5 mg via ORAL
  Filled 2022-03-13 (×4): qty 1

## 2022-03-13 MED ORDER — KETAMINE HCL 10 MG/ML IJ SOLN
INTRAMUSCULAR | Status: AC
  Filled 2022-03-13: qty 1

## 2022-03-13 MED ORDER — ROCURONIUM BROMIDE 10 MG/ML (PF) SYRINGE
PREFILLED_SYRINGE | INTRAVENOUS | Status: AC
  Filled 2022-03-13: qty 10

## 2022-03-13 MED ORDER — ORAL CARE MOUTH RINSE: 15.0000 mL | Freq: Once | OROMUCOSAL | Status: AC

## 2022-03-13 MED ORDER — DIPHENHYDRAMINE HCL 50 MG/ML IJ SOLN: 12.5000 mg | Freq: Four times a day (QID) | INTRAMUSCULAR | Status: DC | PRN

## 2022-03-13 MED ORDER — BUPIVACAINE LIPOSOME 1.3 % IJ SUSP
INTRAMUSCULAR | Status: AC
  Filled 2022-03-13: qty 20

## 2022-03-13 MED ORDER — BUPIVACAINE LIPOSOME 1.3 % IJ SUSP
INTRAMUSCULAR | Status: DC | PRN
  Administered 2022-03-13: 20 mL

## 2022-03-13 MED ORDER — DEXAMETHASONE SODIUM PHOSPHATE 10 MG/ML IJ SOLN
INTRAMUSCULAR | Status: DC | PRN
  Administered 2022-03-13: 4 mg via INTRAVENOUS

## 2022-03-13 MED ORDER — SUGAMMADEX SODIUM 200 MG/2ML IV SOLN
INTRAVENOUS | Status: DC | PRN
  Administered 2022-03-13: 200 mg via INTRAVENOUS

## 2022-03-13 MED ORDER — SODIUM CHLORIDE (PF) 0.9 % IJ SOLN
INTRAMUSCULAR | Status: AC
  Filled 2022-03-13: qty 20

## 2022-03-13 MED ORDER — ONDANSETRON HCL 4 MG/2ML IJ SOLN
INTRAMUSCULAR | Status: DC | PRN
  Administered 2022-03-13: 4 mg via INTRAVENOUS

## 2022-03-13 MED ORDER — FAMOTIDINE 20 MG PO TABS
20.0000 mg | ORAL_TABLET | ORAL | Status: DC | PRN
  Administered 2022-03-14: 20 mg via ORAL
  Filled 2022-03-13: qty 1

## 2022-03-13 MED ORDER — LACTATED RINGERS IV SOLN: INTRAVENOUS | Status: DC

## 2022-03-13 MED ORDER — SODIUM CHLORIDE 0.45 % IV SOLN: INTRAVENOUS | Status: DC

## 2022-03-13 MED ORDER — LIDOCAINE 20MG/ML (2%) 15 ML SYRINGE OPTIME
INTRAMUSCULAR | Status: DC | PRN
  Administered 2022-03-13: 1.5 mg/kg/h via INTRAVENOUS

## 2022-03-13 MED ORDER — CEFAZOLIN SODIUM-DEXTROSE 2-4 GM/100ML-% IV SOLN
2.0000 g | INTRAVENOUS | Status: AC
  Administered 2022-03-13: 2 g via INTRAVENOUS
  Filled 2022-03-13: qty 100

## 2022-03-13 MED ORDER — MIDAZOLAM HCL 2 MG/2ML IJ SOLN
INTRAMUSCULAR | Status: DC | PRN
  Administered 2022-03-13: 2 mg via INTRAVENOUS

## 2022-03-13 MED ORDER — PROPOFOL 10 MG/ML IV BOLUS
INTRAVENOUS | Status: DC | PRN
  Administered 2022-03-13: 150 mg via INTRAVENOUS

## 2022-03-13 MED ORDER — SODIUM CHLORIDE (PF) 0.9 % IJ SOLN
INTRAMUSCULAR | Status: DC | PRN
  Administered 2022-03-13: 10 mL

## 2022-03-13 MED ORDER — ONDANSETRON HCL 4 MG/2ML IJ SOLN
INTRAMUSCULAR | Status: AC
  Filled 2022-03-13: qty 2

## 2022-03-13 MED ORDER — ACETAMINOPHEN 10 MG/ML IV SOLN
1000.0000 mg | Freq: Four times a day (QID) | INTRAVENOUS | Status: AC
  Administered 2022-03-13 – 2022-03-14 (×4): 1000 mg via INTRAVENOUS
  Filled 2022-03-13 (×5): qty 100

## 2022-03-13 MED ORDER — INSULIN ASPART 100 UNIT/ML IJ SOLN: 0.0000 [IU] | Freq: Three times a day (TID) | INTRAMUSCULAR | Status: DC

## 2022-03-13 MED ORDER — DIPHENHYDRAMINE HCL 12.5 MG/5ML PO ELIX: 12.5000 mg | ORAL_SOLUTION | Freq: Four times a day (QID) | ORAL | Status: DC | PRN

## 2022-03-13 MED ORDER — MIDAZOLAM HCL 2 MG/2ML IJ SOLN
INTRAMUSCULAR | Status: AC
  Filled 2022-03-13: qty 2

## 2022-03-13 MED ORDER — LEVOTHYROXINE SODIUM 50 MCG PO TABS
50.0000 ug | ORAL_TABLET | Freq: Every day | ORAL | Status: DC
  Administered 2022-03-14: 50 ug via ORAL
  Filled 2022-03-13: qty 1

## 2022-03-13 MED ORDER — ACETAMINOPHEN 500 MG PO TABS
1000.0000 mg | ORAL_TABLET | Freq: Once | ORAL | Status: AC
  Administered 2022-03-13: 1000 mg via ORAL
  Filled 2022-03-13: qty 2

## 2022-03-13 MED ORDER — LIDOCAINE HCL 2 % IJ SOLN
INTRAMUSCULAR | Status: AC
  Filled 2022-03-13: qty 20

## 2022-03-13 MED ORDER — AMLODIPINE BESYLATE 5 MG PO TABS
5.0000 mg | ORAL_TABLET | Freq: Every day | ORAL | Status: DC
  Administered 2022-03-14: 5 mg via ORAL
  Filled 2022-03-13: qty 1

## 2022-03-13 MED ORDER — ONDANSETRON HCL 4 MG/2ML IJ SOLN
4.0000 mg | INTRAMUSCULAR | Status: DC | PRN
  Administered 2022-03-13 – 2022-03-14 (×2): 4 mg via INTRAVENOUS
  Filled 2022-03-13 (×2): qty 2

## 2022-03-13 MED ORDER — LACTATED RINGERS IR SOLN
Status: DC | PRN
  Administered 2022-03-13: 1

## 2022-03-13 MED ORDER — LIDOCAINE 2% (20 MG/ML) 5 ML SYRINGE
INTRAMUSCULAR | Status: DC | PRN
  Administered 2022-03-13: 60 mg via INTRAVENOUS

## 2022-03-13 MED ORDER — PRAVASTATIN SODIUM 40 MG PO TABS
40.0000 mg | ORAL_TABLET | Freq: Every day | ORAL | Status: DC
  Administered 2022-03-13 – 2022-03-14 (×2): 40 mg via ORAL
  Filled 2022-03-13 (×2): qty 1

## 2022-03-13 MED ORDER — ROCURONIUM BROMIDE 10 MG/ML (PF) SYRINGE
PREFILLED_SYRINGE | INTRAVENOUS | Status: DC | PRN
  Administered 2022-03-13: 70 mg via INTRAVENOUS
  Administered 2022-03-13: 10 mg via INTRAVENOUS
  Administered 2022-03-13: 20 mg via INTRAVENOUS

## 2022-03-13 MED ORDER — HYDROMORPHONE HCL 1 MG/ML IJ SOLN: 0.2500 mg | INTRAMUSCULAR | Status: DC | PRN

## 2022-03-13 MED ORDER — PROPOFOL 10 MG/ML IV BOLUS
INTRAVENOUS | Status: AC
  Filled 2022-03-13: qty 20

## 2022-03-13 MED ORDER — DOCUSATE SODIUM 100 MG PO CAPS
100.0000 mg | ORAL_CAPSULE | Freq: Two times a day (BID) | ORAL | Status: DC
  Administered 2022-03-13 – 2022-03-14 (×2): 100 mg via ORAL
  Filled 2022-03-13 (×2): qty 1

## 2022-03-13 MED ORDER — LIDOCAINE HCL (PF) 2 % IJ SOLN
INTRAMUSCULAR | Status: AC
  Filled 2022-03-13: qty 5

## 2022-03-13 MED ORDER — FENTANYL CITRATE (PF) 250 MCG/5ML IJ SOLN
INTRAMUSCULAR | Status: DC | PRN
  Administered 2022-03-13 (×3): 50 ug via INTRAVENOUS
  Administered 2022-03-13: 100 ug via INTRAVENOUS

## 2022-03-13 MED ORDER — DEXAMETHASONE SODIUM PHOSPHATE 10 MG/ML IJ SOLN
INTRAMUSCULAR | Status: AC
  Filled 2022-03-13: qty 1

## 2022-03-13 MED ORDER — FENTANYL CITRATE (PF) 250 MCG/5ML IJ SOLN
INTRAMUSCULAR | Status: AC
  Filled 2022-03-13: qty 5

## 2022-03-13 SURGICAL SUPPLY — 84 items
ADH SKN CLS APL DERMABOND .7 (GAUZE/BANDAGES/DRESSINGS) ×1
AGENT HMST KT MTR STRL THRMB (HEMOSTASIS)
APL ESCP 34 STRL LF DISP (HEMOSTASIS)
APL LAPSCP 35 DL APL RGD (MISCELLANEOUS) ×1
APL PRP STRL LF DISP 70% ISPRP (MISCELLANEOUS) ×1
APPLICATOR SURGIFLO ENDO (HEMOSTASIS) IMPLANT
APPLICATOR VISTASEAL 35 (MISCELLANEOUS) ×2 IMPLANT
BAG COUNTER SPONGE SURGICOUNT (BAG) IMPLANT
BAG SPNG CNTER NS LX DISP (BAG)
CHLORAPREP W/TINT 26 (MISCELLANEOUS) ×2 IMPLANT
CLIP LIGATING HEM O LOK PURPLE (MISCELLANEOUS) ×4 IMPLANT
CLIP LIGATING HEMO LOK XL GOLD (MISCELLANEOUS) IMPLANT
CLIP LIGATING HEMO O LOK GREEN (MISCELLANEOUS) ×2 IMPLANT
CLIP SUT LAPRA TY ABSORB (SUTURE) ×4 IMPLANT
COVER SURGICAL LIGHT HANDLE (MISCELLANEOUS) ×2 IMPLANT
COVER TIP SHEARS 8 DVNC (MISCELLANEOUS) ×2 IMPLANT
COVER TIP SHEARS 8MM DA VINCI (MISCELLANEOUS) ×1
CUTTER ECHEON FLEX ENDO 45 340 (ENDOMECHANICALS) IMPLANT
DERMABOND ADVANCED .7 DNX12 (GAUZE/BANDAGES/DRESSINGS) ×2 IMPLANT
DRAIN CHANNEL 15F RND FF 3/16 (WOUND CARE) IMPLANT
DRAPE ARM DVNC X/XI (DISPOSABLE) ×8 IMPLANT
DRAPE COLUMN DVNC XI (DISPOSABLE) ×2 IMPLANT
DRAPE DA VINCI XI ARM (DISPOSABLE) ×4
DRAPE DA VINCI XI COLUMN (DISPOSABLE) ×1
DRAPE INCISE IOBAN 66X45 STRL (DRAPES) ×2 IMPLANT
DRAPE SHEET LG 3/4 BI-LAMINATE (DRAPES) ×2 IMPLANT
ELECT PENCIL ROCKER SW 15FT (MISCELLANEOUS) ×2 IMPLANT
ELECT REM PT RETURN 15FT ADLT (MISCELLANEOUS) ×2 IMPLANT
EVACUATOR SILICONE 100CC (DRAIN) IMPLANT
GAUZE 4X4 16PLY ~~LOC~~+RFID DBL (SPONGE) IMPLANT
GLOVE BIO SURGEON STRL SZ 6.5 (GLOVE) ×2 IMPLANT
GLOVE BIOGEL PI IND STRL 8 (GLOVE) ×2 IMPLANT
GLOVE SURG LX STRL 7.5 STRW (GLOVE) ×4 IMPLANT
GOWN SRG XL LVL 4 BRTHBL STRL (GOWNS) ×2 IMPLANT
GOWN STRL NON-REIN XL LVL4 (GOWNS) ×1
GOWN STRL REUS W/ TWL XL LVL3 (GOWN DISPOSABLE) ×4 IMPLANT
GOWN STRL REUS W/TWL XL LVL3 (GOWN DISPOSABLE) ×3
HEMOSTAT SURGICEL 4X8 (HEMOSTASIS) ×2 IMPLANT
HOLDER FOLEY CATH W/STRAP (MISCELLANEOUS) ×2 IMPLANT
IRRIG SUCT STRYKERFLOW 2 WTIP (MISCELLANEOUS) ×1
IRRIGATION SUCT STRKRFLW 2 WTP (MISCELLANEOUS) ×2 IMPLANT
KIT BASIN OR (CUSTOM PROCEDURE TRAY) ×2 IMPLANT
KIT TURNOVER KIT A (KITS) IMPLANT
LOOP VESSEL MAXI BLUE (MISCELLANEOUS) IMPLANT
MARKER SKIN DUAL TIP RULER LAB (MISCELLANEOUS) ×2 IMPLANT
NDL INSUFFLATION 14GA 120MM (NEEDLE) ×2 IMPLANT
NEEDLE INSUFFLATION 14GA 120MM (NEEDLE) ×1 IMPLANT
PROTECTOR NERVE ULNAR (MISCELLANEOUS) ×4 IMPLANT
RELOAD STAPLE 45 2.6 WHT THIN (STAPLE) IMPLANT
SCISSORS LAP 5X45 EPIX DISP (ENDOMECHANICALS) ×2 IMPLANT
SEAL CANN UNIV 5-8 DVNC XI (MISCELLANEOUS) ×8 IMPLANT
SEAL XI 5MM-8MM UNIVERSAL (MISCELLANEOUS) ×4
SET TUBE SMOKE EVAC HIGH FLOW (TUBING) ×2 IMPLANT
SLEEVE ADV FIXATION 12X100MM (TROCAR) IMPLANT
SOLUTION ELECTROLUBE (MISCELLANEOUS) ×2 IMPLANT
SPIKE FLUID TRANSFER (MISCELLANEOUS) ×2 IMPLANT
STAPLE RELOAD 45 WHT (STAPLE) IMPLANT
STAPLE RELOAD 45MM WHITE (STAPLE)
SURGIFLO W/THROMBIN 8M KIT (HEMOSTASIS) IMPLANT
SUT ETHILON 2 0 PSLX (SUTURE) IMPLANT
SUT MNCRL AB 4-0 PS2 18 (SUTURE) ×4 IMPLANT
SUT PDS AB 0 CT1 36 (SUTURE) IMPLANT
SUT V-LOC BARB 180 2/0GR6 GS22 (SUTURE)
SUT V-LOC BARB 180 2/0GR9 GS23 (SUTURE) ×1
SUT VIC AB 0 CT1 27 (SUTURE) ×1
SUT VIC AB 0 CT1 27XBRD ANTBC (SUTURE) IMPLANT
SUT VIC AB 1 CT1 36 (SUTURE) ×8 IMPLANT
SUT VIC AB 2-0 SH 27 (SUTURE) ×1
SUT VIC AB 2-0 SH 27X BRD (SUTURE) IMPLANT
SUT VIC AB 4-0 SH 27 (SUTURE) ×2
SUT VIC AB 4-0 SH 27XBRD (SUTURE) IMPLANT
SUT VICRYL 0 UR6 27IN ABS (SUTURE) IMPLANT
SUT VLOC BARB 180 ABS3/0GR12 (SUTURE) ×4
SUTURE V-LC BRB 180 2/0GR6GS22 (SUTURE) IMPLANT
SUTURE V-LC BRB 180 2/0GR9GS23 (SUTURE) ×2 IMPLANT
SUTURE VLOC BRB 180 ABS3/0GR12 (SUTURE) ×4 IMPLANT
SYS BAG RETRIEVAL 10MM (BASKET) ×1
SYSTEM BAG RETRIEVAL 10MM (BASKET) ×2 IMPLANT
TOWEL OR 17X26 10 PK STRL BLUE (TOWEL DISPOSABLE) ×2 IMPLANT
TRAY FOLEY MTR SLVR 16FR STAT (SET/KITS/TRAYS/PACK) ×2 IMPLANT
TRAY LAPAROSCOPIC (CUSTOM PROCEDURE TRAY) ×2 IMPLANT
TROCAR Z THREAD OPTICAL 12X100 (TROCAR) ×2 IMPLANT
TROCAR Z-THREAD OPTICAL 5X100M (TROCAR) IMPLANT
WATER STERILE IRR 1000ML POUR (IV SOLUTION) ×2 IMPLANT

## 2022-03-13 NOTE — Transfer of Care (Signed)
Immediate Anesthesia Transfer of Care Note  Patient: ANIYAH NOBIS  Procedure(s) Performed: XI ROBOTIC ASSITED PARTIAL NEPHRECTOMY (Left)  Patient Location: PACU  Anesthesia Type:General  Level of Consciousness: sedated  Airway & Oxygen Therapy: Patient Spontanous Breathing and Patient connected to face mask oxygen  Post-op Assessment: Report given to RN and Post -op Vital signs reviewed and stable  Post vital signs: Reviewed and stable  Last Vitals:  Vitals Value Taken Time  BP    Temp    Pulse 90 03/13/22 1500  Resp 18 03/13/22 1500  SpO2 100 % 03/13/22 1500  Vitals shown include unvalidated device data.  Last Pain:  Vitals:   03/13/22 1111  TempSrc:   PainSc: 0-No pain         Complications: No notable events documented.

## 2022-03-13 NOTE — Op Note (Signed)
Operative Note  Preoperative diagnosis:  1.  3.6 cm left renal mass  Postoperative diagnosis: 1.  3.6 cm left renal mass  Procedure(s): 1.  Robot-assisted laparoscopic left partial nephrectomy 2.  Intraoperative ultrasound of single retroperitoneal organ  Surgeon: Ellison Hughs, MD  Assistants: Debbrah Alar, PA-C An assistant was required for this surgical procedure.  The duties of the assistant included but were not limited to suctioning, passing suture, camera manipulation, retraction.  This procedure would not be able to be performed without an Environmental consultant.   Anesthesia:  General  Complications:  None  EBL: 100 mL  Specimens: 1.  Left renal mass  Drains/Catheters: 1.  Foley catheter  Intraoperative findings:   Grossly negative margins following excision of left renal mass The left renorrhaphy was hemostatic at the conclusion of the case  Indication:  Stephanie Garrison is a 57 y.o. female with a solid enhancing 3.6 cm left renal mass with features concerning for renal cell carcinoma.  She has been consented for the above procedures, voiced understanding and wishes to proceed.  Description of procedure:  After informed consent was obtained, the patient was brought to the operating room and general endotracheal anesthesia was administered.  A 16 French Foley catheter was then sterilely placed and set to gravity drainage.  The patient was then placed in the right lateral decubitus position and prepped and draped in usual sterile fashion.  A timeout was performed.  An 8 mm incision was then made lateral to the left rectus muscle at the level of the left 12th rib.  Abdominal access was obtained via a Veress needle.  The abdominal cavity was then insufflated up to 15 mmHg.  An 8 mm port was then introduced into the abdominal cavity.  Inspection of the port entry site by the robotic camera revealed no adjacent organ injury.  We then placed 3 additional 8 mm robotic ports to  triangulate the left renal hilum.  A 12 mm assistant port was then placed between the carmera port and 3rd robotic arm.  The white line of Toldt along the descending colon was incised sharply and the colon, along with its mesocolonic fat, was reflected medially until the aorta was identified.  We then made a small window adjacent to the lower pole of the left kidney, identifying the left psoas muscle, left ureter and left gonadal vein.  The left ureter and gonadal vein were then reflected anteriorly allowing [place] to then incised the perihilar attachments using electrocautery.  We encountered a small lumbar vein adjacent to the insertion of the left gonadal vein into the left renal vein.  This lumbar vein was ligated with hemo-lock clips in 2 places and incised sharply.  This provided [place] excellent exposure to the left renal hilum.  The perilymphatic tissue surrounding the left renal artery was carefully dissected away, creating a window to place a bulldog clamp later in the procedure.  The anterior portion of Gerota's fascia was incised, allowing reflection the perinephric fat medially and laterally until there was adequate exposure of the lower pole left renal mass.  Intraoperative ultrasound confirmed the heterogenous echogenicity of the lesion compared to the remainder of the renal parenchyma and allowed identification of the depth/borders of the mass, which were demarcated using electrocautery along the renal capsule.  We then exposed the left renal artery and placed a bulldog clamp, marking warm ischemia time.  The left kidney immediately became ischemic and pale in appearance.  The left renal mass was then sharply  excised with minimal blood loss.  After the mass was free, it was placed in the left upper quadrant to be retrieved later on during the operation.    The renorrhaphy was then performed using a series of 3-0 V-lock sutures in the deep layer of the small area of exposed collecting system and renal  parenchyma.  The bulldog clamp was then removed marking warm ischemia time at 26 minutes.   A series of 1-0 Vicryl sutures with Hem-o-lok clips acting as a buttress were then used to reapproximate the renal capsule.  There did not appear to be any obvious bleeding around the renal hilum nor surrounding our repair.  The incised Gerota's fascia overlying the mass was then reapproximated using a running 2-0 V lock suture.  The mass was then placed in an Endo Catch bag.  Surgicel and Vistaseal were then applied to the resection bed.  The robot was then de-docked and the camera was then reinserted into the assistant port. Laparoscopic graspers were then used to grab the string of the Endo Catch bag, which was brought out through the 12 mm assistant port.  The abdomen was then desufflated and all ports were removed.  The assistant port incision was then extended approximately 1-2 cm and the left renal mass, within the Endo Catch bag, was removed and sent to pathology for permanent section.  The fascia within the assistant port incision was then reapproximated using a 0 Vicryl suture.  The remainder of the incisions were then closed using 4-0 Monocryl and dressed appropriately.  Patient tolerated the procedure well and was transferred to the postanesthesia unit in stable condition.  Plan: Monitor on the floor overnight with bedrest

## 2022-03-13 NOTE — Discharge Instructions (Signed)
Activity:  You are encouraged to ambulate frequently (about every hour during waking hours) to help prevent blood clots from forming in your legs or lungs.  However, you should not engage in any heavy lifting (> 10-15 lbs), strenuous activity, or straining. Diet: You should advance your diet as instructed by your physician.  It will be normal to have some bloating, nausea, and abdominal discomfort intermittently. Prescriptions:  You will be provided a prescription for pain medication to take as needed.  If your pain is not severe enough to require the prescription pain medication, you may take extra strength Tylenol instead which will have less side effects.  You should also take a prescribed stool softener to avoid straining with bowel movements as the prescription pain medication may constipate you. Incisions: You may remove your dressing bandages 48 hours after surgery if not removed in the hospital.  You will either have some small staples or special tissue glue at each of the incision sites. Once the bandages are removed (if present), the incisions may stay open to air.  You may start showering (but not soaking or bathing in water) the 2nd day after surgery and the incisions simply need to be patted dry after the shower.  No additional care is needed. What to call us about: You should call the office [phone]) if you develop fever > 101 or develop persistent vomiting.   You may resume aspirin, advil, aleve, vitamins, and supplements 7 days after surgery.

## 2022-03-13 NOTE — Anesthesia Procedure Notes (Signed)
Procedure Name: Intubation Date/Time: 03/13/2022 12:15 PM  Performed by: Lollie Sails, CRNAPre-anesthesia Checklist: Patient identified, Emergency Drugs available, Suction available, Patient being monitored and Timeout performed Patient Re-evaluated:Patient Re-evaluated prior to induction Oxygen Delivery Method: Circle system utilized Preoxygenation: Pre-oxygenation with 100% oxygen Induction Type: IV induction Ventilation: Mask ventilation without difficulty Laryngoscope Size: Miller and 3 Grade View: Grade I Tube type: Oral Tube size: 7.0 mm Number of attempts: 1 Airway Equipment and Method: Stylet Placement Confirmation: ETT inserted through vocal cords under direct vision, positive ETCO2 and breath sounds checked- equal and bilateral Secured at: 22 cm Tube secured with: Tape Dental Injury: Teeth and Oropharynx as per pre-operative assessment

## 2022-03-14 DIAGNOSIS — N2889 Other specified disorders of kidney and ureter: Secondary | ICD-10-CM | POA: Diagnosis not present

## 2022-03-14 LAB — GLUCOSE, CAPILLARY
Glucose-Capillary: 119 mg/dL — ABNORMAL HIGH (ref 70–99)
Glucose-Capillary: 120 mg/dL — ABNORMAL HIGH (ref 70–99)

## 2022-03-14 LAB — HEMOGLOBIN AND HEMATOCRIT, BLOOD
HCT: 34.1 % — ABNORMAL LOW (ref 36.0–46.0)
Hemoglobin: 11 g/dL — ABNORMAL LOW (ref 12.0–15.0)

## 2022-03-14 LAB — BASIC METABOLIC PANEL
Anion gap: 8 (ref 5–15)
BUN: 9 mg/dL (ref 6–20)
CO2: 28 mmol/L (ref 22–32)
Calcium: 8.3 mg/dL — ABNORMAL LOW (ref 8.9–10.3)
Chloride: 98 mmol/L (ref 98–111)
Creatinine, Ser: 0.73 mg/dL (ref 0.44–1.00)
GFR, Estimated: >60 mL/min (ref >60)
Glucose, Bld: 122 mg/dL — ABNORMAL HIGH (ref 70–99)
Potassium: 3.1 mmol/L — ABNORMAL LOW (ref 3.5–5.1)
Sodium: 134 mmol/L — ABNORMAL LOW (ref 135–145)

## 2022-03-14 NOTE — Plan of Care (Signed)
  Problem: Metabolic: Goal: Ability to maintain appropriate glucose levels will improve Outcome: Progressing   Problem: Nutritional: Goal: Maintenance of adequate nutrition will improve Outcome: Progressing   Problem: Skin Integrity: Goal: Risk for impaired skin integrity will decrease Outcome: Progressing   Problem: Tissue Perfusion: Goal: Adequacy of tissue perfusion will improve Outcome: Progressing

## 2022-03-14 NOTE — Progress Notes (Signed)
Mobility Specialist - Progress Note   03/14/22 1015  Mobility  Activity Ambulated independently in hallway  Level of Assistance Independent  Assistive Device None  Distance Ambulated (ft) 1500 ft  Range of Motion/Exercises Active  Activity Response Tolerated well  Mobility Referral Yes  $Mobility charge 1 Mobility   Pt was found in room with RN and agreeable to ambulate. Had some abdominal soreness but stated feeling better during ambulation. At EOS returned to room with all necessities.  Ferd Hibbs Mobility Specialist

## 2022-03-14 NOTE — Discharge Summary (Signed)
Physician Discharge Summary  Patient ID: Stephanie Garrison MRN: [id number] DOB/AGE: 1965/10/20 57 y.o.  Admit date: 03/13/2022 Discharge date: 03/14/2022  Admission Diagnoses:  Renal mass  Discharge Diagnoses:  Principal Problem:   Renal mass   Past Medical History:  Diagnosis Date   Arthritis    Diabetes mellitus without complication (HCC)    GERD (gastroesophageal reflux disease)    OTC   Heart murmur    Hypertension    Hypothyroidism    Pre-diabetes    RBBB (right bundle branch block)    recurrent, asymptomatic   S/P CABG (coronary artery bypass graft) 1964   when she was 57 years old   S/P repair of tetralogy of Fallot    2D ECHO, 10/10/2009 - EF >55%, normal to mild   Thyroid disease    hypothyroidism    Surgeries: Procedure(s): XI ROBOTIC ASSITED PARTIAL NEPHRECTOMY on 03/13/2022   Consultants (if any): Treatment Team:  Ceasar Mons, MD  Discharged Condition: Improved  Hospital Course: Stephanie Garrison is an 57 y.o. female who was admitted 03/13/2022 with a diagnosis of Renal mass and went to the operating room on 03/13/2022 and underwent the above named procedures.  She is doing well on POD#1 and would like a regular diet.  I will have her start a regular diet and discontinue the IV and foley with the plan to discharge later today when ambulating and if tolerating the diet.  Her Hgb is 11.  BP 111/73 (BP Location: Left Arm)   Pulse 66   Temp 98.4 F (36.9 C) (Oral)   Resp 20   Wt 75.8 kg   SpO2 97%   BMI 30.54 kg/m  Gen:WD, WN in NAD Lungs: CTA CV: RRR. GI: Soft, mild tenderness with +BS.  Wounds intact. Ext: FROM without tenderness.   She was given perioperative antibiotics:  Anti-infectives (From admission, onward)    Start     Dose/Rate Route Frequency Ordered Stop   03/13/22 1027  ceFAZolin (ANCEF) IVPB 2g/100 mL premix        2 g 200 mL/hr over 30 Minutes Intravenous 30 min pre-op 03/13/22 1027 03/13/22 1231     .  She was  given sequential compression devices for DVT prophylaxis.  She benefited maximally from the hospital stay and there were no complications.    Recent vital signs:  Vitals:   03/14/22 0113 03/14/22 0520  BP: 111/72 111/73  Pulse: 71 66  Resp: (!) 21 20  Temp: 98.3 F (36.8 C) 98.4 F (36.9 C)  SpO2: 97% 97%    Recent laboratory studies:  Lab Results  Component Value Date   HGB 11.0 (L) 03/14/2022   HGB 13.8 03/13/2022   HGB 12.5 02/27/2022   Lab Results  Component Value Date   WBC 6.2 02/27/2022   PLT 248 02/27/2022   No results found for: "INR" Lab Results  Component Value Date   NA 134 (L) 02/27/2022   K 3.3 (L) 02/27/2022   CL 97 (L) 02/27/2022   CO2 26 02/27/2022   BUN 21 (H) 02/27/2022   CREATININE 0.63 02/27/2022   GLUCOSE 101 (H) 02/27/2022    Discharge Medications:   Allergies as of 03/14/2022   No Known Allergies      Medication List     TAKE these medications    acetaminophen 650 MG CR tablet Commonly known as: TYLENOL Take 1,300 mg by mouth every 8 (eight) hours as needed for pain.   acetaminophen 500 MG tablet  Commonly known as: TYLENOL Take 1,000 mg by mouth every 6 (six) hours as needed for moderate pain or mild pain.   amLODipine 5 MG tablet Commonly known as: NORVASC TAKE 1 TABLET DAILY   bisacodyl 5 MG EC tablet Commonly known as: DULCOLAX Take 5 mg by mouth daily as needed for moderate constipation. Exlax   famotidine 20 MG tablet Commonly known as: PEPCID Take 20 mg by mouth as needed for heartburn or indigestion.   HYDROcodone-acetaminophen 5-325 MG tablet Commonly known as: Norco Take 1-2 tablets by mouth every 6 (six) hours as needed for moderate pain or severe pain.   levothyroxine 50 MCG tablet Commonly known as: SYNTHROID TAKE 1 TABLET DAILY   metFORMIN 500 MG tablet Commonly known as: GLUCOPHAGE Take 1 tab by mouth daily   olmesartan-hydrochlorothiazide 40-25 MG tablet Commonly known as: BENICAR HCT TAKE 1  TABLET DAILY (NEED AN APPOINTMENT)   pravastatin 40 MG tablet Commonly known as: PRAVACHOL TAKE 1 TABLET DAILY   Vitamin D (Ergocalciferol) 1.25 MG (50000 UNIT) Caps capsule Commonly known as: DRISDOL TAKE 1 CAPSULE EVERY 7 DAYS What changed: See the new instructions.        Diagnostic Studies: No results found.  Disposition: Discharge disposition: 01-Home or Self Care          Follow-up Information     Ceasar Mons, MD Follow up on 03/30/2022.   Specialty: Urology Why: at 12:45 Contact information: [address] [place]  [id number] [phone]                  Signed: Irine Seal 03/14/2022, 7:42 AM

## 2022-03-14 NOTE — TOC CM/SW Note (Signed)
Transition of Care [facility]) Screening Note  Patient Details  Name: Stephanie Garrison Date of Birth: 02-Jan-1966  Transition of Care [facility]) CM/SW Contact:    Sherie Don, LCSW Phone Number: 03/14/2022, 10:32 AM  Transition of Care Department [facility]) has reviewed patient and no TOC needs have been identified at this time. We will continue to monitor patient advancement through interdisciplinary progression rounds. If new patient transition needs arise, please place a TOC consult.

## 2022-03-15 NOTE — Anesthesia Postprocedure Evaluation (Signed)
Anesthesia Post Note  Patient: DELANIE TIRRELL  Procedure(s) Performed: XI ROBOTIC ASSITED PARTIAL NEPHRECTOMY (Left)     Patient location during evaluation: Other Anesthesia Type: General Level of consciousness: awake and alert Pain management: pain level controlled Vital Signs Assessment: post-procedure vital signs reviewed and stable Respiratory status: spontaneous breathing, nonlabored ventilation and respiratory function stable Cardiovascular status: blood pressure returned to baseline and stable Postop Assessment: no apparent nausea or vomiting Anesthetic complications: no  No notable events documented.  Last Vitals:  Vitals:   03/14/22 0520 03/14/22 1530  BP: 111/73 95/62  Pulse: 66 74  Resp: 20 18  Temp: 36.9 C 36.9 C  SpO2: 97% 94%    Last Pain:  Vitals:   03/14/22 1649  TempSrc:   PainSc: 5                  Preciosa Bundrick,W. EDMOND

## 2022-03-16 ENCOUNTER — Encounter (HOSPITAL_COMMUNITY): Payer: Self-pay | Admitting: Urology

## 2022-03-16 LAB — SURGICAL PATHOLOGY

## 2022-04-14 ENCOUNTER — Encounter: Payer: Self-pay | Admitting: Nurse Practitioner

## 2022-04-14 ENCOUNTER — Ambulatory Visit: Payer: 59 | Admitting: Nurse Practitioner

## 2022-04-14 VITALS — BP 118/78 | HR 64 | Temp 98.7°F | Ht 62.0 in | Wt 173.0 lb

## 2022-04-14 DIAGNOSIS — I10 Essential (primary) hypertension: Secondary | ICD-10-CM | POA: Diagnosis not present

## 2022-04-14 DIAGNOSIS — Z23 Encounter for immunization: Secondary | ICD-10-CM | POA: Diagnosis not present

## 2022-04-14 DIAGNOSIS — Z905 Acquired absence of kidney: Secondary | ICD-10-CM

## 2022-04-14 DIAGNOSIS — E559 Vitamin D deficiency, unspecified: Secondary | ICD-10-CM

## 2022-04-14 DIAGNOSIS — E039 Hypothyroidism, unspecified: Secondary | ICD-10-CM | POA: Diagnosis not present

## 2022-04-14 DIAGNOSIS — R7309 Other abnormal glucose: Secondary | ICD-10-CM

## 2022-04-14 DIAGNOSIS — E782 Mixed hyperlipidemia: Secondary | ICD-10-CM

## 2022-04-14 DIAGNOSIS — C642 Malignant neoplasm of left kidney, except renal pelvis: Secondary | ICD-10-CM

## 2022-04-14 DIAGNOSIS — Z1211 Encounter for screening for malignant neoplasm of colon: Secondary | ICD-10-CM

## 2022-04-14 NOTE — Progress Notes (Signed)
I,Stephanie Garrison,acting as a Education administrator for Stephanie Brine, FNP.,have documented all relevant documentation on the behalf of Stephanie Brine, FNP,as directed by  Stephanie Brine, FNP while in the presence of Stephanie Garrison, [place].    Subjective:     Patient ID: Stephanie Garrison , female    DOB: 1965-07-30 , 57 y.o.   MRN: [id number]   Chief Complaint  Patient presents with   Hypertension    HPI  Patient presents today for htn follow up. Patient would like referral for colonoscopy. She had a left nephrectomy (30%) of her kidney. Found to be stage 1.    Hypertension This is a chronic problem. The current episode started more than 1 year ago. The problem is unchanged. The problem is controlled. Pertinent negatives include no chest pain, headaches, palpitations or shortness of breath. There are no associated agents to hypertension. Risk factors for coronary artery disease include obesity. Past treatments include diuretics and angiotensin blockers. The current treatment provides moderate improvement. Compliance problems include exercise and diet.  There is no history of angina or kidney disease. There is no history of chronic renal disease.     Past Medical History:  Diagnosis Date   Arthritis    Diabetes mellitus without complication (HCC)    GERD (gastroesophageal reflux disease)    OTC   Heart murmur    Hypertension    Hypothyroidism    Pre-diabetes    RBBB (right bundle branch block)    recurrent, asymptomatic   S/P CABG (coronary artery bypass graft) 1964   when she was 57 years old   S/P repair of tetralogy of Fallot    2D ECHO, 10/10/2009 - EF >55%, normal to mild   Thyroid disease    hypothyroidism     Family History  Problem Relation Age of Onset   Hyperlipidemia Mother    Breast cancer Cousin 38       maternal 1st cousin     Current Outpatient Medications:    acetaminophen (TYLENOL) 500 MG tablet, Take 1,000 mg by mouth every 6 (six) hours as needed for moderate pain or  mild pain., Disp: , Rfl:    acetaminophen (TYLENOL) 650 MG CR tablet, Take 1,300 mg by mouth every 8 (eight) hours as needed for pain., Disp: , Rfl:    amLODipine (NORVASC) 5 MG tablet, TAKE 1 TABLET DAILY, Disp: 90 tablet, Rfl: 3   bisacodyl (DULCOLAX) 5 MG EC tablet, Take 5 mg by mouth daily as needed for moderate constipation. Exlax, Disp: , Rfl:    famotidine (PEPCID) 20 MG tablet, Take 20 mg by mouth as needed for heartburn or indigestion., Disp: , Rfl:    HYDROcodone-acetaminophen (NORCO) 5-325 MG tablet, Take 1-2 tablets by mouth every 6 (six) hours as needed for moderate pain or severe pain., Disp: 20 tablet, Rfl: 0   levothyroxine (SYNTHROID) 50 MCG tablet, TAKE 1 TABLET DAILY, Disp: 90 tablet, Rfl: 3   metFORMIN (GLUCOPHAGE) 500 MG tablet, Take 1 tab by mouth daily, Disp: 90 tablet, Rfl: 1   olmesartan-hydrochlorothiazide (BENICAR HCT) 40-25 MG tablet, TAKE 1 TABLET DAILY (NEED AN APPOINTMENT), Disp: 90 tablet, Rfl: 3   pravastatin (PRAVACHOL) 40 MG tablet, TAKE 1 TABLET DAILY, Disp: 90 tablet, Rfl: 3   Vitamin D, Ergocalciferol, (DRISDOL) 1.25 MG (50000 UNIT) CAPS capsule, TAKE 1 CAPSULE EVERY 7 DAYS (Patient taking differently: Take 50,000 Units by mouth every Sunday.), Disp: 12 capsule, Rfl: 3   No Known Allergies   Review of Systems  Constitutional: Negative.   Respiratory: Negative.  Negative for shortness of breath and wheezing.   Cardiovascular:  Positive for leg swelling (puffiness). Negative for chest pain and palpitations.  Neurological: Negative.  Negative for headaches.  Psychiatric/Behavioral: Negative.    All other systems reviewed and are negative.    Today's Vitals   04/14/22 0825  BP: 118/78  Pulse: 64  Temp: 98.7 F (37.1 C)  TempSrc: Oral  SpO2: 98%  Weight: 173 lb (78.5 kg)  Height: 5' 2"$  (1.575 m)   Body mass index is 31.64 kg/m.   Objective:  Physical Exam Vitals reviewed.  Constitutional:      General: She is not in acute distress.     Appearance: Normal appearance. She is obese.  Cardiovascular:     Rate and Rhythm: Normal rate and regular rhythm.     Pulses: Normal pulses.     Heart sounds: Murmur heard.  Pulmonary:     Effort: Pulmonary effort is normal. No respiratory distress.     Breath sounds: Normal breath sounds. No wheezing.  Musculoskeletal:     Comments: She has right malleolus puffiness to bilateral ankles.  Skin:    General: Skin is warm and dry.     Capillary Refill: Capillary refill takes less than 2 seconds.  Neurological:     General: No focal deficit present.     Mental Status: She is alert and oriented to person, place, and time.     Cranial Nerves: No cranial nerve deficit.     Motor: No weakness.  Psychiatric:        Mood and Affect: Mood normal.        Behavior: Behavior normal.        Thought Content: Thought content normal.        Judgment: Judgment normal.         Assessment And Plan:     1. Essential hypertension Comments: Blood pressure is well controlled, continue current medications.  2. Mixed hyperlipidemia Comments: Continue current medications, tolerating well  3. Acquired hypothyroidism Comments: Continue current medications, will check thyroid studies. - TSH + free T4  4. Vitamin D deficiency Will check vitamin D level and supplement as needed.    Also encouraged to spend 15 minutes in the sun daily.  - VITAMIN D 25 Hydroxy (Vit-D Deficiency, Fractures)  5. Abnormal glucose Comments: HgbA1c is stable, continue focusing on diet low in sugar and carbs - Hemoglobin A1c - BMP8+eGFR - Lipid panel  6. Renal cell carcinoma of left kidney (HCC) Comments: This is a new diagnosis and had a partial nephrectomy, no further treatment, will continue to follow up with Urology  7. S/p nephrectomy Comments: Partial nephectomy on 03/14/2022, overall she is doing well returns to work on March 4th  8. Encounter for screening colonoscopy - Ambulatory referral to  Gastroenterology  9. Need for Tdap vaccination Will give tetanus vaccine today while in office. Refer to order management. TDAP will be administered to adults 65-47 years old every 10 years. - Tdap vaccine greater than or equal to 7yo IM     Patient was given opportunity to ask questions. Patient verbalized understanding of the plan and was able to repeat key elements of the plan. All questions were answered to their satisfaction.  Stephanie Brine, FNP   I, Stephanie Brine, FNP, have reviewed all documentation for this visit. The documentation on 04/14/22 for the exam, diagnosis, procedures, and orders are all accurate and complete.   IF YOU HAVE  BEEN REFERRED TO A SPECIALIST, IT MAY TAKE 1-2 WEEKS TO SCHEDULE/PROCESS THE REFERRAL. IF YOU HAVE NOT HEARD FROM US/SPECIALIST IN TWO WEEKS, PLEASE GIVE [place] A CALL AT [phone].   THE PATIENT IS ENCOURAGED TO PRACTICE SOCIAL DISTANCING DUE TO THE COVID-19 PANDEMIC.

## 2022-04-15 ENCOUNTER — Other Ambulatory Visit: Payer: Self-pay | Admitting: Nurse Practitioner

## 2022-04-15 DIAGNOSIS — R7309 Other abnormal glucose: Secondary | ICD-10-CM

## 2022-04-15 LAB — BMP8+EGFR
BUN/Creatinine Ratio: 19 (ref 9–23)
BUN: 13 mg/dL (ref 6–24)
CO2: 24 mmol/L (ref 20–29)
Calcium: 9.2 mg/dL (ref 8.7–10.2)
Chloride: 101 mmol/L (ref 96–106)
Creatinine, Ser: 0.7 mg/dL (ref 0.57–1.00)
Glucose: 110 mg/dL — ABNORMAL HIGH (ref 70–99)
Potassium: 3.5 mmol/L (ref 3.5–5.2)
Sodium: 139 mmol/L (ref 134–144)
eGFR: 101 mL/min/{1.73_m2} (ref >59)

## 2022-04-15 LAB — LIPID PANEL
Chol/HDL Ratio: 4.2 ratio (ref 0.0–4.4)
Cholesterol, Total: 232 mg/dL — ABNORMAL HIGH (ref 100–199)
HDL: 55 mg/dL (ref >39)
LDL Chol Calc (NIH): 145 mg/dL — ABNORMAL HIGH (ref 0–99)
Triglycerides: 179 mg/dL — ABNORMAL HIGH (ref 0–149)
VLDL Cholesterol Cal: 32 mg/dL (ref 5–40)

## 2022-04-15 LAB — TSH+FREE T4
Free T4: 1.6 ng/dL (ref 0.82–1.77)
TSH: 1.51 u[IU]/mL (ref 0.450–4.500)

## 2022-04-15 LAB — HEMOGLOBIN A1C
Est. average glucose Bld gHb Est-mCnc: 131 mg/dL
Hgb A1c MFr Bld: 6.2 % — ABNORMAL HIGH (ref 4.8–5.6)

## 2022-04-15 LAB — VITAMIN D 25 HYDROXY (VIT D DEFICIENCY, FRACTURES): Vit D, 25-Hydroxy: 63.4 ng/mL (ref 30.0–100.0)

## 2022-04-15 MED ORDER — METFORMIN HCL 500 MG PO TABS: 500.0000 mg | ORAL_TABLET | Freq: Two times a day (BID) | ORAL | 1 refills | Status: DC

## 2022-04-29 ENCOUNTER — Other Ambulatory Visit: Payer: Self-pay | Admitting: Nurse Practitioner

## 2022-04-29 MED ORDER — PRAVASTATIN SODIUM 80 MG PO TABS: 80.0000 mg | ORAL_TABLET | Freq: Every day | ORAL | 1 refills | Status: DC

## 2022-05-05 ENCOUNTER — Other Ambulatory Visit: Payer: Self-pay | Admitting: Nurse Practitioner

## 2022-05-05 DIAGNOSIS — R7309 Other abnormal glucose: Secondary | ICD-10-CM

## 2022-06-11 LAB — HM COLONOSCOPY

## 2022-06-24 ENCOUNTER — Ambulatory Visit (INDEPENDENT_AMBULATORY_CARE_PROVIDER_SITE_OTHER): Payer: 59 | Admitting: Nurse Practitioner

## 2022-06-24 ENCOUNTER — Encounter: Payer: Self-pay | Admitting: Nurse Practitioner

## 2022-06-24 VITALS — BP 130/70 | HR 85 | Temp 98.4°F | Ht 62.0 in | Wt 181.0 lb

## 2022-06-24 DIAGNOSIS — E039 Hypothyroidism, unspecified: Secondary | ICD-10-CM

## 2022-06-24 DIAGNOSIS — Z0000 Encounter for general adult medical examination without abnormal findings: Secondary | ICD-10-CM

## 2022-06-24 DIAGNOSIS — E6609 Other obesity due to excess calories: Secondary | ICD-10-CM

## 2022-06-24 DIAGNOSIS — R7309 Other abnormal glucose: Secondary | ICD-10-CM | POA: Diagnosis not present

## 2022-06-24 DIAGNOSIS — E559 Vitamin D deficiency, unspecified: Secondary | ICD-10-CM | POA: Diagnosis not present

## 2022-06-24 DIAGNOSIS — C642 Malignant neoplasm of left kidney, except renal pelvis: Secondary | ICD-10-CM

## 2022-06-24 DIAGNOSIS — Z6833 Body mass index (BMI) 33.0-33.9, adult: Secondary | ICD-10-CM

## 2022-06-24 DIAGNOSIS — E782 Mixed hyperlipidemia: Secondary | ICD-10-CM | POA: Diagnosis not present

## 2022-06-24 DIAGNOSIS — Z2821 Immunization not carried out because of patient refusal: Secondary | ICD-10-CM

## 2022-06-24 DIAGNOSIS — Z79899 Other long term (current) drug therapy: Secondary | ICD-10-CM

## 2022-06-24 DIAGNOSIS — N2889 Other specified disorders of kidney and ureter: Secondary | ICD-10-CM

## 2022-06-24 DIAGNOSIS — I10 Essential (primary) hypertension: Secondary | ICD-10-CM | POA: Diagnosis not present

## 2022-06-24 LAB — POCT URINALYSIS DIPSTICK
Bilirubin, UA: NEGATIVE
Glucose, UA: NEGATIVE
Ketones, UA: NEGATIVE
Leukocytes, UA: NEGATIVE
Nitrite, UA: NEGATIVE
Protein, UA: NEGATIVE
Spec Grav, UA: 1.02 (ref 1.010–1.025)
Urobilinogen, UA: 0.2 E.U./dL
pH, UA: 6 (ref 5.0–8.0)

## 2022-06-24 NOTE — Progress Notes (Signed)
Stephanie Garrison,acting as a Neurosurgeon for Arnette Felts, FNP.,have documented all relevant documentation on the behalf of Arnette Felts, FNP,as directed by  Arnette Felts, FNP while in the presence of Arnette Felts, FNP.   Subjective:     Patient ID: Stephanie Garrison , female    DOB: 05-Jun-1965 , 57 y.o.   MRN: [id number]   Chief Complaint  Patient presents with   Annual Exam    HPI  Patient presents today for HM, patient states compliance with medications and has no other concerns today. She is due to go back to the urologist May 29th for labs then she will see the provider on June 6th. Continues to see Cardiology next appt in June  Wt Readings from Last 3 Encounters: 06/24/22 : 181 lb (82.1 kg) 04/14/22 : 173 lb (78.5 kg) 03/13/22 : 167 lb (75.8 kg)       Past Medical History:  Diagnosis Date   Arthritis    Diabetes mellitus without complication (HCC)    GERD (gastroesophageal reflux disease)    OTC   Heart murmur    Hypertension    Hypothyroidism    Pre-diabetes    RBBB (right bundle branch block)    recurrent, asymptomatic   S/P CABG (coronary artery bypass graft) 1964   when she was 57 years old   S/P repair of tetralogy of Fallot    2D ECHO, 10/10/2009 - EF >55%, normal to mild   Thyroid disease    hypothyroidism     Family History  Problem Relation Age of Onset   Hyperlipidemia Mother    Breast cancer Cousin 58       maternal 1st cousin     Current Outpatient Medications:    acetaminophen (TYLENOL) 500 MG tablet, Take 1,000 mg by mouth every 6 (six) hours as needed for moderate pain or mild pain., Disp: , Rfl:    acetaminophen (TYLENOL) 650 MG CR tablet, Take 1,300 mg by mouth every 8 (eight) hours as needed for pain., Disp: , Rfl:    famotidine (PEPCID) 20 MG tablet, Take 20 mg by mouth as needed for heartburn or indigestion., Disp: , Rfl:    metFORMIN (GLUCOPHAGE) 500 MG tablet, TAKE 1 TABLET BY MOUTH EVERY DAY, Disp: 30 tablet, Rfl: 5    olmesartan-hydrochlorothiazide (BENICAR HCT) 40-25 MG tablet, TAKE 1 TABLET DAILY (NEED AN APPOINTMENT), Disp: 90 tablet, Rfl: 3   pravastatin (PRAVACHOL) 80 MG tablet, Take 1 tablet (80 mg total) by mouth daily., Disp: 90 tablet, Rfl: 1   Vitamin D, Ergocalciferol, (DRISDOL) 1.25 MG (50000 UNIT) CAPS capsule, TAKE 1 CAPSULE EVERY 7 DAYS (Patient taking differently: Take 50,000 Units by mouth every Sunday.), Disp: 12 capsule, Rfl: 3   amLODipine (NORVASC) 5 MG tablet, TAKE 1 TABLET DAILY, Disp: 90 tablet, Rfl: 3   bisacodyl (DULCOLAX) 5 MG EC tablet, Take 5 mg by mouth daily as needed for moderate constipation. Exlax (Patient not taking: Reported on 06/24/2022), Disp: , Rfl:    HYDROcodone-acetaminophen (NORCO) 5-325 MG tablet, Take 1-2 tablets by mouth every 6 (six) hours as needed for moderate pain or severe pain. (Patient not taking: Reported on 06/24/2022), Disp: 20 tablet, Rfl: 0   levothyroxine (SYNTHROID) 50 MCG tablet, TAKE 1 TABLET DAILY, Disp: 90 tablet, Rfl: 3   No Known Allergies    The patient states she is post menopausal status.  No LMP recorded. (Menstrual status: Irregular Periods).  Negative for Dysmenorrhea and Negative for Menorrhagia. Negative for: breast discharge, breast  lump(s), breast pain and breast self exam. Associated symptoms include abnormal vaginal bleeding. Pertinent negatives include abnormal bleeding (hematology), anxiety, decreased libido, depression, difficulty falling sleep, dyspareunia, history of infertility, nocturia, sexual dysfunction, sleep disturbances, urinary incontinence, urinary urgency, vaginal discharge and vaginal itching. Diet regular; she is trying to stay away from foods that are not good for her.  The patient states her exercise level is minimal 2 times a week of walking, she is currently lacking motivation.   The patient's tobacco use is:  Social History   Tobacco Use  Smoking Status Never  Smokeless Tobacco Never   She has been exposed to  passive smoke. The patient's alcohol use is:  Social History   Substance and Sexual Activity  Alcohol Use Yes   Alcohol/week: 6.0 standard drinks of alcohol   Types: 6 Cans of beer per week   Comment: 3 drinks tonight or more    Additional information: Last pap 04/10/2020, next one scheduled for 04/11/2023.    Review of Systems  Constitutional: Negative.   HENT: Negative.    Eyes: Negative.   Respiratory: Negative.  Negative for shortness of breath.   Cardiovascular: Negative.  Negative for chest pain and palpitations.  Gastrointestinal: Negative.   Endocrine: Negative.   Genitourinary: Negative.   Musculoskeletal: Negative.   Skin: Negative.   Allergic/Immunologic: Negative.   Neurological: Negative.  Negative for headaches.  Hematological: Negative.   Psychiatric/Behavioral: Negative.       Today's Vitals   06/24/22 0954  BP: 130/70  Pulse: 85  Temp: 98.4 F (36.9 C)  TempSrc: Oral  Weight: 181 lb (82.1 kg)  Height: 5\' 2"  (1.575 m)  PainSc: 0-No pain   Body mass index is 33.11 kg/m.   Objective:  Physical Exam Vitals reviewed.  Constitutional:      General: She is not in acute distress.    Appearance: Normal appearance. She is well-developed. She is obese.  HENT:     Head: Normocephalic and atraumatic.     Right Ear: Hearing, tympanic membrane, ear canal and external ear normal. There is no impacted cerumen.     Left Ear: Hearing, tympanic membrane, ear canal and external ear normal. There is no impacted cerumen.     Nose: Nose normal.  Eyes:     General: Lids are normal.     Extraocular Movements: Extraocular movements intact.     Conjunctiva/sclera: Conjunctivae normal.     Pupils: Pupils are equal, round, and reactive to light.     Funduscopic exam:    Right eye: No papilledema.        Left eye: No papilledema.  Neck:     Thyroid: No thyroid mass.     Vascular: No carotid bruit.  Cardiovascular:     Rate and Rhythm: Normal rate and regular rhythm.      Pulses: Normal pulses.     Heart sounds: Normal heart sounds. No murmur heard. Pulmonary:     Effort: Pulmonary effort is normal. No respiratory distress.     Breath sounds: Normal breath sounds. No wheezing.  Chest:     Chest wall: No mass.  Breasts:    Tanner Score is 5.     Right: Normal. No mass or tenderness.     Left: Normal. No mass or tenderness.  Abdominal:     General: Abdomen is flat. Bowel sounds are normal. There is no distension.     Palpations: Abdomen is soft.     Tenderness: There is no abdominal  tenderness.  Musculoskeletal:        General: No swelling or tenderness. Normal range of motion.     Cervical back: Full passive range of motion without pain, normal range of motion and neck supple.     Right lower leg: No edema.     Left lower leg: No edema.  Lymphadenopathy:     Upper Body:     Right upper body: No supraclavicular, axillary or pectoral adenopathy.     Left upper body: No supraclavicular, axillary or pectoral adenopathy.  Skin:    General: Skin is warm and dry.     Capillary Refill: Capillary refill takes less than 2 seconds.     Comments: surgical scar to chest  Neurological:     General: No focal deficit present.     Mental Status: She is alert and oriented to person, place, and time.     Cranial Nerves: No cranial nerve deficit.     Sensory: No sensory deficit.     Motor: No weakness.  Psychiatric:        Mood and Affect: Mood normal.        Behavior: Behavior normal.        Thought Content: Thought content normal.        Judgment: Judgment normal.         Assessment And Plan:     1. Annual physical exam Behavior modifications discussed and diet history reviewed.   Pt will continue to exercise regularly and modify diet with low GI, plant based foods and decrease intake of processed foods.  Recommend intake of daily multivitamin, Vitamin D, and calcium.  Recommend mammogram and colonoscopy for preventive screenings, as well as  recommend immunizations that include influenza, TDAP  2. Abnormal glucose Comments: HgbA1c is stable. Continue metformin and following diet low in sugar and starches. - Hemoglobin A1c - CMP14+EGFR  3. Essential hypertension Comments: Blood pressure is controlled, continue current medications. EKG done HR 66 Sinus Rhythm, Right bundle branch block, Nonspecific T-abnormality. - EKG 12-Lead - Microalbumin / creatinine urine ratio - POCT urinalysis dipstick - CMP14+EGFR  4. Mixed hyperlipidemia Comments: Cholesterol levels increased at last visit, she is to continue statin. Tolerating well. - Lipid panel - CMP14+EGFR  5. Vitamin D deficiency Will check vitamin D level and supplement as needed.    Also encouraged to spend 15 minutes in the sun daily.  - VITAMIN D 25 Hydroxy (Vit-D Deficiency, Fractures)  6. Acquired hypothyroidism Comments: Thyroid levels are controlled, continue current medications. - TSH + free T4  7. Other specified disorders of kidney and ureter  8. Herpes zoster vaccination declined  9. Class 1 obesity due to excess calories with serious comorbidity and body mass index (BMI) of 33.0 to 33.9 in adult She is encouraged to strive for BMI less than 30 to decrease cardiac risk. Advised to aim for at least 150 minutes of exercise per week.  10. Other long term (current) drug therapy - CBC  11. Renal cell carcinoma of left kidney [facility]) Comments: Continue f/u with Urology. Overall she is doing well.   Patient was given opportunity to ask questions. Patient verbalized understanding of the plan and was able to repeat key elements of the plan. All questions were answered to their satisfaction.   Arnette Felts, FNP   I, Arnette Felts, FNP, have reviewed all documentation for this visit. The documentation on 06/24/22 for the exam, diagnosis, procedures, and orders are all accurate and complete.   THE PATIENT  IS ENCOURAGED TO PRACTICE SOCIAL DISTANCING DUE TO THE  COVID-19 PANDEMIC.

## 2022-06-24 NOTE — Patient Instructions (Signed)

## 2022-06-25 LAB — HEMOGLOBIN A1C
Est. average glucose Bld gHb Est-mCnc: 134 mg/dL
Hgb A1c MFr Bld: 6.3 % — ABNORMAL HIGH (ref 4.8–5.6)

## 2022-06-25 LAB — CMP14+EGFR
ALT: 23 IU/L (ref 0–32)
AST: 19 IU/L (ref 0–40)
Albumin/Globulin Ratio: 1.5 (ref 1.2–2.2)
Albumin: 4.1 g/dL (ref 3.8–4.9)
Alkaline Phosphatase: 60 IU/L (ref 44–121)
BUN/Creatinine Ratio: 29 — ABNORMAL HIGH (ref 9–23)
BUN: 19 mg/dL (ref 6–24)
Bilirubin Total: 0.5 mg/dL (ref 0.0–1.2)
CO2: 22 mmol/L (ref 20–29)
Calcium: 9.4 mg/dL (ref 8.7–10.2)
Chloride: 100 mmol/L (ref 96–106)
Creatinine, Ser: 0.65 mg/dL (ref 0.57–1.00)
Globulin, Total: 2.7 g/dL (ref 1.5–4.5)
Glucose: 89 mg/dL (ref 70–99)
Potassium: 4.4 mmol/L (ref 3.5–5.2)
Sodium: 138 mmol/L (ref 134–144)
Total Protein: 6.8 g/dL (ref 6.0–8.5)
eGFR: 103 mL/min/{1.73_m2} (ref >59)

## 2022-06-25 LAB — TSH+FREE T4
Free T4: 1.61 ng/dL (ref 0.82–1.77)
TSH: 3.31 u[IU]/mL (ref 0.450–4.500)

## 2022-06-25 LAB — CBC
Hematocrit: 37 % (ref 34.0–46.6)
Hemoglobin: 11.9 g/dL (ref 11.1–15.9)
MCH: 29.9 pg (ref 26.6–33.0)
MCHC: 32.2 g/dL (ref 31.5–35.7)
MCV: 93 fL (ref 79–97)
Platelets: 290 10*3/uL (ref 150–450)
RBC: 3.98 x10E6/uL (ref 3.77–5.28)
RDW: 12.7 % (ref 11.7–15.4)
WBC: 10.8 10*3/uL (ref 3.4–10.8)

## 2022-06-25 LAB — LIPID PANEL
Chol/HDL Ratio: 2.5 ratio (ref 0.0–4.4)
Cholesterol, Total: 224 mg/dL — ABNORMAL HIGH (ref 100–199)
HDL: 90 mg/dL (ref >39)
LDL Chol Calc (NIH): 117 mg/dL — ABNORMAL HIGH (ref 0–99)
Triglycerides: 99 mg/dL (ref 0–149)
VLDL Cholesterol Cal: 17 mg/dL (ref 5–40)

## 2022-06-25 LAB — MICROALBUMIN / CREATININE URINE RATIO
Creatinine, Urine: 34.1 mg/dL
Microalb/Creat Ratio: 18 mg/g creat (ref 0–29)
Microalbumin, Urine: 6 ug/mL

## 2022-06-25 LAB — VITAMIN D 25 HYDROXY (VIT D DEFICIENCY, FRACTURES): Vit D, 25-Hydroxy: 64.6 ng/mL (ref 30.0–100.0)

## 2022-06-30 ENCOUNTER — Other Ambulatory Visit: Payer: Self-pay | Admitting: Nurse Practitioner

## 2022-06-30 DIAGNOSIS — Z1231 Encounter for screening mammogram for malignant neoplasm of breast: Secondary | ICD-10-CM

## 2022-07-06 ENCOUNTER — Other Ambulatory Visit: Payer: Self-pay | Admitting: Nurse Practitioner

## 2022-07-07 MED ORDER — METFORMIN HCL 500 MG PO TABS: 500.0000 mg | ORAL_TABLET | Freq: Two times a day (BID) | ORAL | 1 refills | Status: DC

## 2022-07-21 ENCOUNTER — Encounter: Payer: Self-pay | Admitting: Nurse Practitioner

## 2022-07-24 ENCOUNTER — Other Ambulatory Visit (HOSPITAL_COMMUNITY): Payer: Self-pay | Admitting: Urology

## 2022-07-24 ENCOUNTER — Ambulatory Visit (HOSPITAL_COMMUNITY)
Admission: RE | Admit: 2022-07-24 | Discharge: 2022-07-24 | Disposition: A | Discharge status: Home / Self Care | Payer: 59 | Source: Ambulatory Visit | Attending: Urology | Admitting: Urology

## 2022-07-24 DIAGNOSIS — Z85528 Personal history of other malignant neoplasm of kidney: Secondary | ICD-10-CM

## 2022-08-11 ENCOUNTER — Ambulatory Visit: Payer: 59

## 2022-08-11 IMAGING — US US RENAL
1 series · 14 of 25 positions shown · non-contrast
Comparison: None Available.

CLINICAL DATA: Microhematuria.

EXAM:
RENAL / URINARY TRACT ULTRASOUND COMPLETE

[Series 1: us renal · 0.23mm/px · 44 acquisitions, 14 frames shown]
[im 1/44]
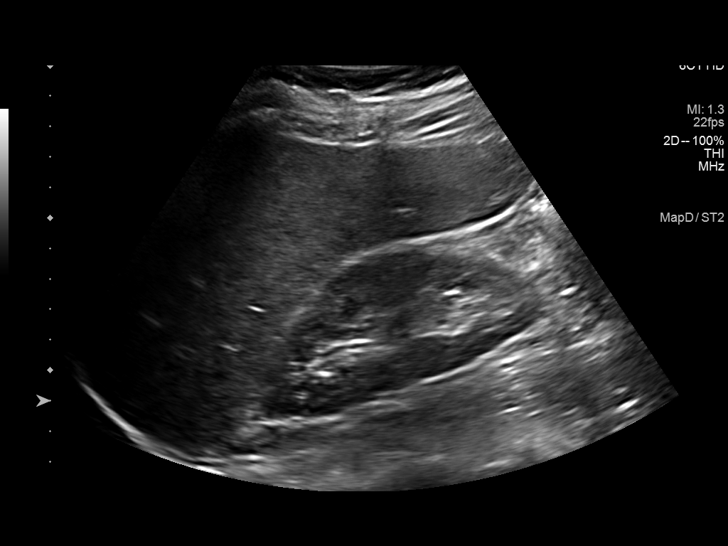
[im 4/44]
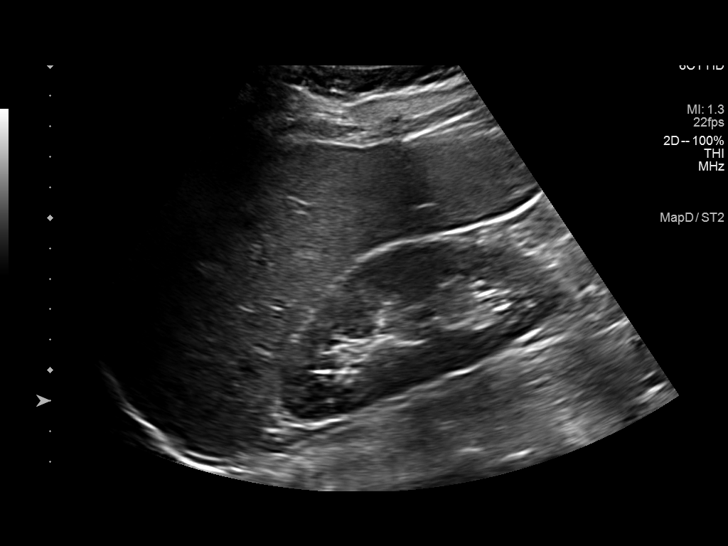
[im 8/44]
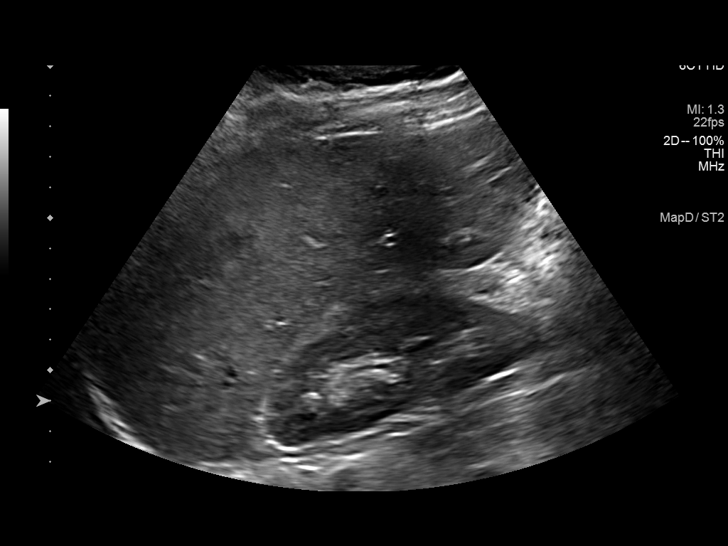
[im 11/44]
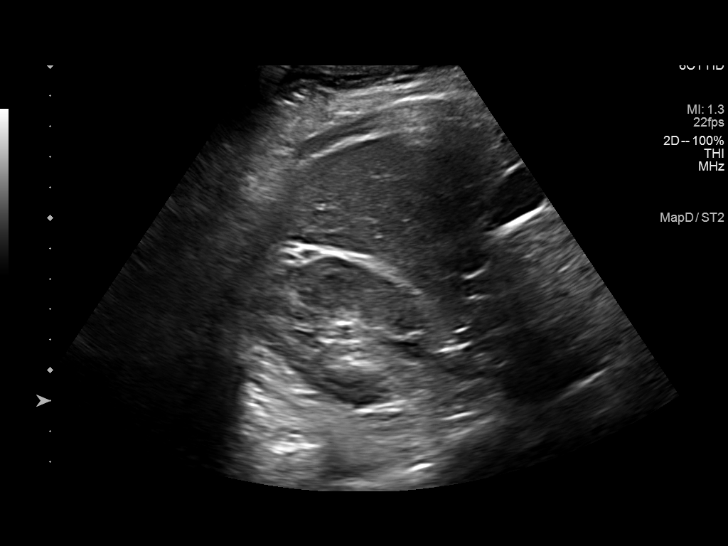
[im 15/44]
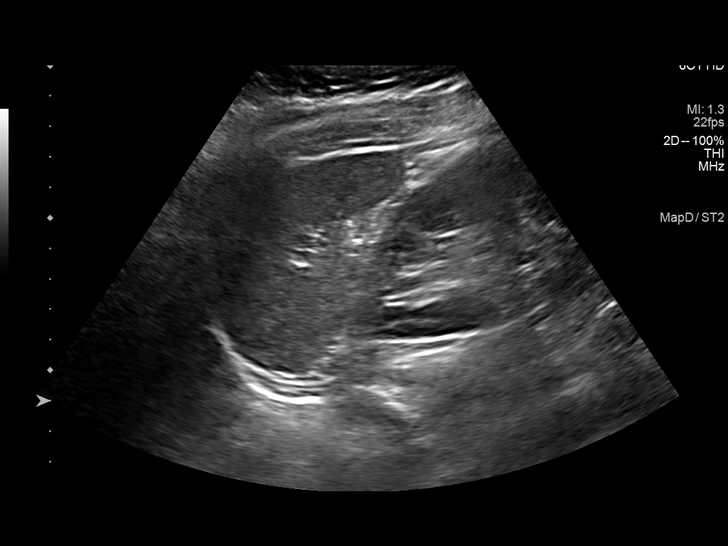
[im 17/44]
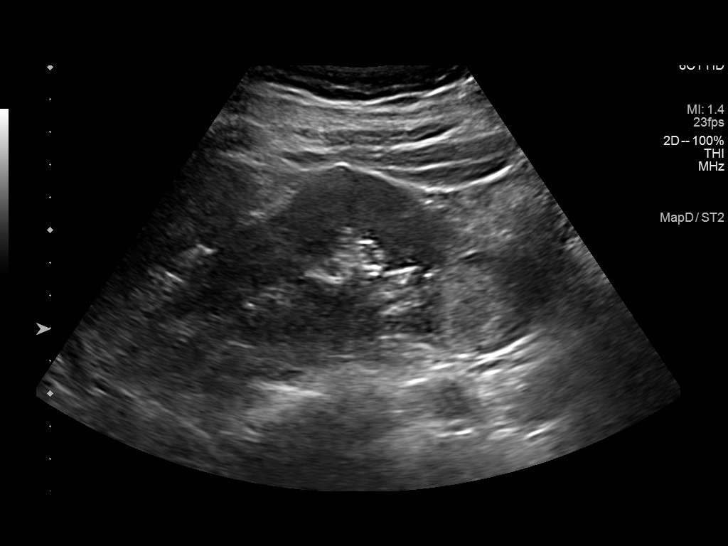
[im 20/44]
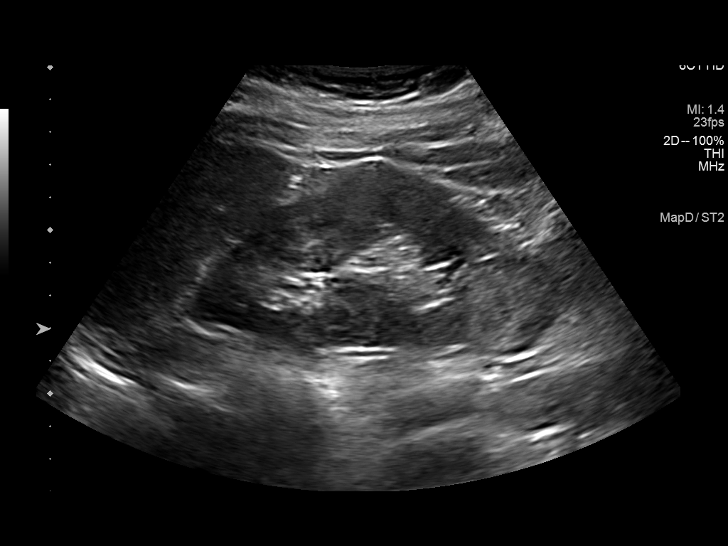
[im 24/44]
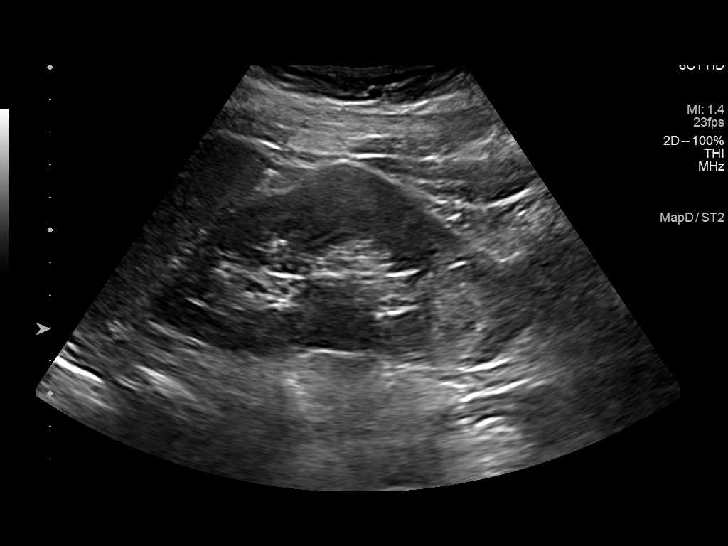
[im 27/44]
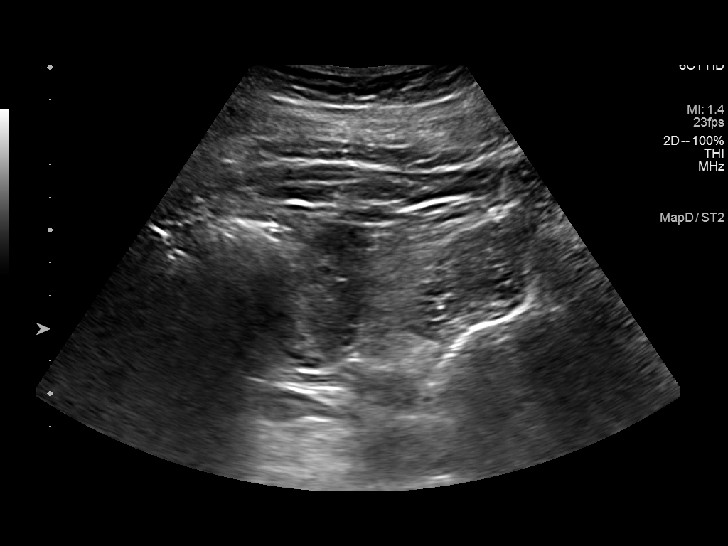
[im 29/44]
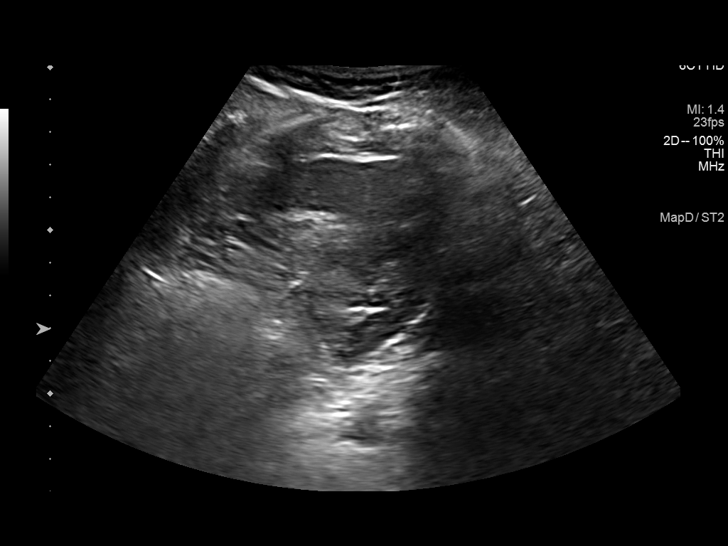
[im 33/44]
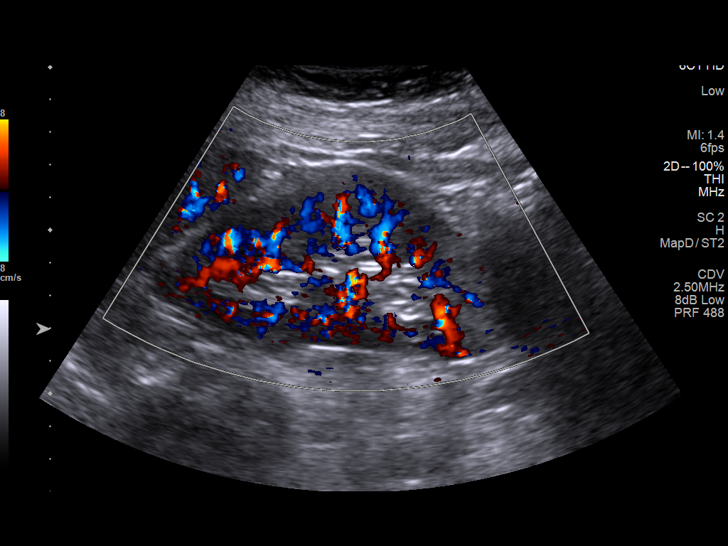
[im 36/44]
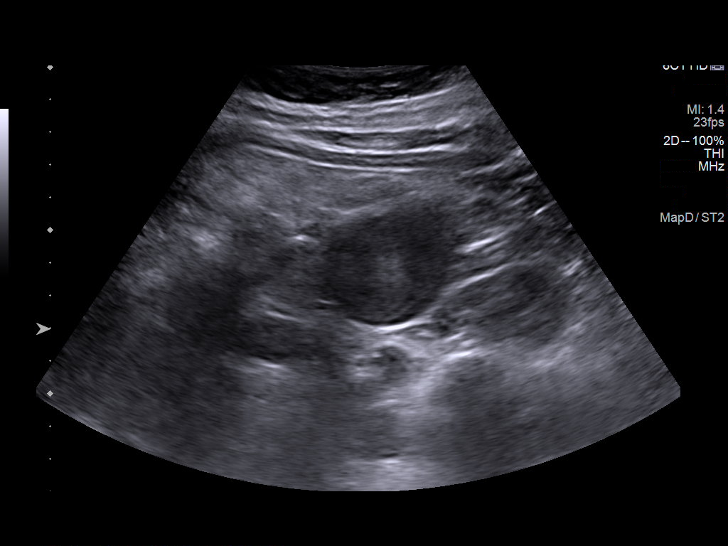
[im 40/44]
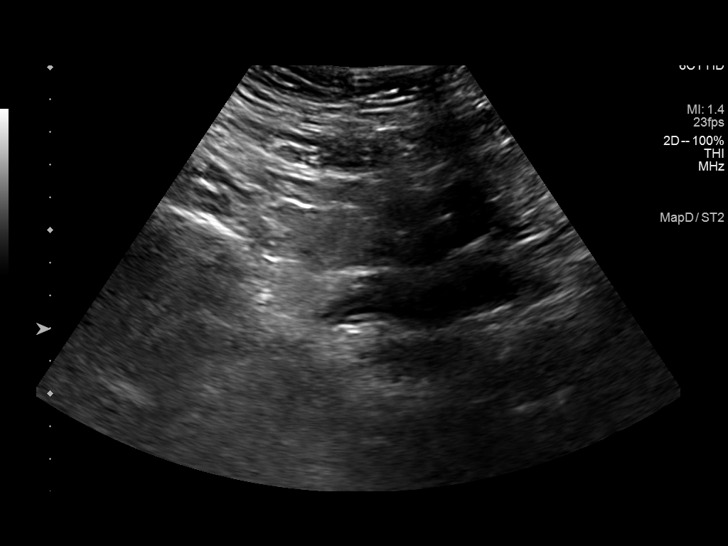
[im 44/44]
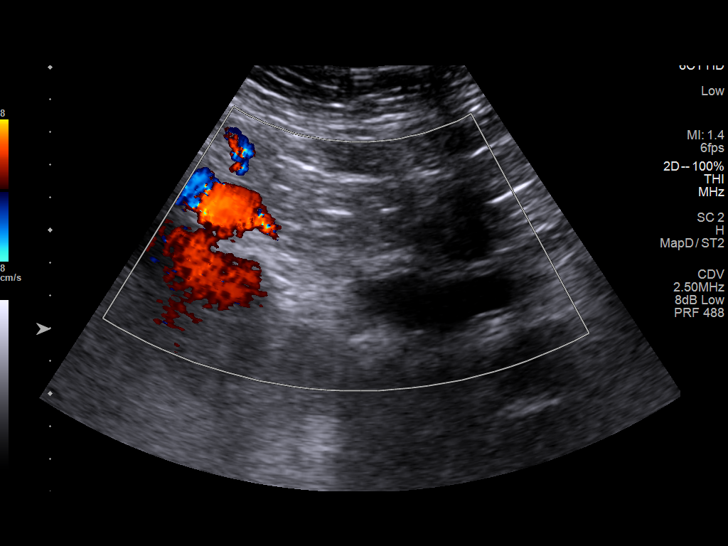

[14 of 25 positions shown; findings below may reference images not displayed]

FINDINGS: Right Kidney:

Renal measurements: 10.0 x 4.6 x 4.7 cm = volume: 113 mL.
Echogenicity within normal limits. No mass or hydronephrosis
visualized.

Left Kidney:

Renal measurements: 11.3 x 5.7 x 4.4 cm = volume: 146 mL. There
appears to be a solid 3.6 cm mass in the inferior pole the left
kidney.

Bladder:

Poorly distended limiting evaluation.

Other:

None.
IMPRESSION: 1. 3.6 cm solid mass in the inferior pole of the left kidney
worrisome for renal cell carcinoma. Recommend MRI of the abdomen
with and without contrast for better evaluation.
2. The bladder is poorly evaluated due to lack of distention.
3. The right kidney is normal.

These results will be called to the ordering clinician or
representative by the Radiologist Assistant, and communication
documented in the PACS or [REDACTED].

## 2022-08-24 ENCOUNTER — Other Ambulatory Visit: Payer: Self-pay | Admitting: Nurse Practitioner

## 2022-09-02 ENCOUNTER — Ambulatory Visit
Admission: RE | Admit: 2022-09-02 | Discharge: 2022-09-02 | Disposition: A | Discharge status: Home / Self Care | Payer: 59 | Source: Ambulatory Visit | Attending: Nurse Practitioner | Admitting: Nurse Practitioner

## 2022-09-02 DIAGNOSIS — Z1231 Encounter for screening mammogram for malignant neoplasm of breast: Secondary | ICD-10-CM

## 2022-09-10 ENCOUNTER — Encounter (HOSPITAL_COMMUNITY): Payer: Self-pay

## 2022-09-10 ENCOUNTER — Ambulatory Visit (HOSPITAL_COMMUNITY)
Admission: EM | Admit: 2022-09-10 | Discharge: 2022-09-10 | Disposition: A | Discharge status: Home / Self Care | Payer: 59 | Attending: Family Medicine | Admitting: Family Medicine

## 2022-09-10 ENCOUNTER — Ambulatory Visit (HOSPITAL_COMMUNITY): Payer: 59

## 2022-09-10 DIAGNOSIS — M25571 Pain in right ankle and joints of right foot: Secondary | ICD-10-CM

## 2022-09-10 DIAGNOSIS — M25472 Effusion, left ankle: Secondary | ICD-10-CM | POA: Diagnosis not present

## 2022-09-10 DIAGNOSIS — M25471 Effusion, right ankle: Secondary | ICD-10-CM | POA: Diagnosis not present

## 2022-09-10 MED ORDER — INDOMETHACIN 50 MG PO CAPS: 50.0000 mg | ORAL_CAPSULE | Freq: Two times a day (BID) | ORAL | 0 refills | Status: AC

## 2022-09-10 NOTE — ED Provider Notes (Signed)
[facility]    CSN: [id number] Arrival date & time: 09/10/22  1700      History   Chief Complaint Chief Complaint  Patient presents with   Ankle Pain    HPI Stephanie Garrison is a 57 y.o. female.   HPI Patient here with right ankle swelling which is worsened over the last 3 days.  Patient has not where she has had any specific injury.  She endorses that pain has progressed over the last few days.  She has not had any previous problems with her ankle.  She reports she works a job in which she has to stand on her feet for prolonged periods of time and she wears steel toed boots.  She has taken over-the-counter medication without improvement of symptoms.  Past Medical History:  Diagnosis Date   Arthritis    Diabetes mellitus without complication (HCC)    GERD (gastroesophageal reflux disease)    OTC   Heart murmur    Hypertension    Hypothyroidism    Pre-diabetes    RBBB (right bundle branch block)    recurrent, asymptomatic   S/P CABG (coronary artery bypass graft) 1964   when she was 57 years old   S/P repair of tetralogy of Fallot    2D ECHO, 10/10/2009 - EF >55%, normal to mild   Thyroid disease    hypothyroidism    Patient Active Problem List   Diagnosis Date Noted   Renal cell carcinoma of left kidney (HCC) 04/14/2022   Renal mass 03/13/2022   BMI 31.0-31.9,adult 11/02/2019   Thyroid dysfunction 10/24/2018   Acquired hypothyroidism 11/25/2017   Mixed hyperlipidemia 11/25/2017   Abnormal glucose 11/25/2017   Vitamin D deficiency 11/25/2017   Rotator cuff disorder, right 11/25/2017   Preoperative clearance 08/20/2017   Right bundle branch block 09/29/2012   Essential hypertension 09/29/2012   Congenital heart disease 09/29/2012    Past Surgical History:  Procedure Laterality Date   CARDIAC SURGERY  1970   repair Tetrology of Fallot   CESAREAN SECTION     COLONOSCOPY     CORONARY ARTERY BYPASS GRAFT     DILATION AND CURETTAGE OF UTERUS      ROBOTIC ASSITED PARTIAL NEPHRECTOMY Left 03/13/2022   Procedure: XI ROBOTIC ASSITED PARTIAL NEPHRECTOMY;  Surgeon: Rene Paci, MD;  Location: [facility];  Service: Urology;  Laterality: Left;   SHOULDER ARTHROSCOPY WITH SUBACROMIAL DECOMPRESSION, ROTATOR CUFF REPAIR AND BICEP TENDON REPAIR Right 09/20/2017   Procedure: RIGHT SHOULDER ARTHROSCOPY WITH DEBRIDEMENT EXTENSIVE, SUBACROMIAL DECOMPRESSION, ROTATOR CUFF REPAIR AND BICEP TENODESIS;  Surgeon: Bjorn Pippin, MD;  Location: [facility];  Service: Orthopedics;  Laterality: Right;    OB History   No obstetric history on file.      Home Medications    Prior to Admission medications   Medication Sig Start Date End Date Taking? Authorizing Provider  acetaminophen (TYLENOL) 500 MG tablet Take 1,000 mg by mouth every 6 (six) hours as needed for moderate pain or mild pain.   Yes [provider]  amLODipine (NORVASC) 5 MG tablet TAKE 1 TABLET DAILY 07/06/22  Yes Arnette Felts, FNP  bisacodyl (DULCOLAX) 5 MG EC tablet Take 5 mg by mouth daily as needed for moderate constipation. Exlax   Yes [provider]  famotidine (PEPCID) 20 MG tablet Take 20 mg by mouth as needed for heartburn or indigestion.   Yes [provider]  HYDROcodone-acetaminophen (NORCO) 5-325 MG tablet Take 1-2 tablets by  mouth every 6 (six) hours as needed for moderate pain or severe pain. 03/13/22  Yes Dancy, Marchelle Folks, PA-C  indomethacin (INDOCIN) 50 MG capsule Take 1 capsule (50 mg total) by mouth 2 (two) times daily with a meal for 10 days. 09/10/22 09/20/22 Yes Bing Neighbors, NP  levothyroxine (SYNTHROID) 50 MCG tablet TAKE 1 TABLET DAILY 07/06/22  Yes Arnette Felts, FNP  metFORMIN (GLUCOPHAGE) 500 MG tablet Take 1 tablet (500 mg total) by mouth 2 (two) times daily with a meal. 07/07/22  Yes Arnette Felts, FNP  olmesartan-hydrochlorothiazide (BENICAR HCT) 40-25 MG tablet TAKE 1 TABLET DAILY (NEED AN APPOINTMENT) 08/25/22  Yes  Arnette Felts, FNP  pravastatin (PRAVACHOL) 80 MG tablet Take 1 tablet (80 mg total) by mouth daily. 04/29/22  Yes Arnette Felts, FNP  Vitamin D, Ergocalciferol, (DRISDOL) 1.25 MG (50000 UNIT) CAPS capsule TAKE 1 CAPSULE EVERY 7 DAYS Patient taking differently: Take 50,000 Units by mouth every Sunday. 12/29/21  Yes Arnette Felts, FNP  acetaminophen (TYLENOL) 650 MG CR tablet Take 1,300 mg by mouth every 8 (eight) hours as needed for pain.    [provider]    Family History Family History  Problem Relation Age of Onset   Hyperlipidemia Mother    Breast cancer Cousin 103       maternal 1st cousin    Social History Social History   Tobacco Use   Smoking status: Never   Smokeless tobacco: Never  Vaping Use   Vaping status: Never Used  Substance Use Topics   Alcohol use: Yes    Alcohol/week: 6.0 standard drinks of alcohol    Types: 6 Cans of beer per week    Comment: 3 drinks tonight or more    Drug use: No     Allergies   Patient has no known allergies.   Review of Systems Review of Systems Pertinent negatives listed in HPI   Physical Exam Triage Vital Signs ED Triage Vitals  Encounter Vitals Group     BP 09/10/22 1818 105/78     Systolic BP Percentile --      Diastolic BP Percentile --      Pulse Rate 09/10/22 1818 75     Resp 09/10/22 1818 18     Temp 09/10/22 1818 98.8 F (37.1 C)     Temp Source 09/10/22 1818 Oral     SpO2 09/10/22 1818 95 %     Weight 09/10/22 1817 169 lb (76.7 kg)     Height 09/10/22 1817 5\' 2"  (1.575 m)     Head Circumference --      Peak Flow --      Pain Score 09/10/22 1816 8     Pain Loc --      Pain Education --      Exclude from Growth Chart --    No data found.  Updated Vital Signs BP 105/78 (BP Location: Left Arm)   Pulse 75   Temp 98.8 F (37.1 C) (Oral)   Resp 18   Ht 5\' 2"  (1.575 m)   Wt 169 lb (76.7 kg)   LMP  (LMP Unknown)   SpO2 95%   BMI 30.91 kg/m   Visual Acuity Right Eye Distance:   Left Eye  Distance:   Bilateral Distance:    Right Eye Near:   Left Eye Near:    Bilateral Near:     Physical Exam Vitals reviewed.  Constitutional:      Appearance: Normal appearance.  HENT:  Head: Normocephalic.  Eyes:     Extraocular Movements: Extraocular movements intact.     Pupils: Pupils are equal, round, and reactive to light.  Cardiovascular:     Rate and Rhythm: Normal rate and regular rhythm.  Pulmonary:     Effort: Pulmonary effort is normal.     Breath sounds: Normal breath sounds.  Musculoskeletal:     Cervical back: Normal range of motion and neck supple.     Right ankle: Swelling present.     Left ankle: Normal.     Comments: Large visible mass affixed to lateral malleolus involving the right ankle concerning for effusion   Smaller visible mass of left ankle medial malleolus consistent with a ankle effusion    Skin:    General: Skin is warm.  Neurological:     General: No focal deficit present.     Mental Status: She is alert.      UC Treatments / Results  Labs (all labs ordered are listed, but only abnormal results are displayed) Labs Reviewed - No data to display  EKG   Radiology No results found.  Procedures Procedures (including critical care time)  Medications Ordered in UC Medications - No data to display  Initial Impression / Assessment and Plan / UC Course  I have reviewed the triage vital signs and the nursing notes.  Pertinent labs & imaging results that were available during my care of the patient were reviewed by me and considered in my medical decision making (see chart for details).    Acute effusion of right and left ankle.  Right ankle has a significantly visible large effusion present which is causing patient discomfort and difficulty with ambulation.  Patient has been advised to follow-up at [facility] walk-in clinic tomorrow morning for further workup and evaluation and possibly to have ankle drained.  For now we will start on  high-dose anti-inflammatories indomethacin twice daily for 10 days.  Patient verbalized understanding and agreement with plan today. Final Clinical Impressions(s) / UC Diagnoses   Final diagnoses:  Ankle effusion, right  Left ankle effusion  Acute right ankle pain     Discharge Instructions      As discussed you have either a joint effusion or a cyst on your ankle.  Follow-up at [facility] walk-in clinic tomorrow morning at 8:00 am.     ED Prescriptions     Medication Sig Dispense Auth. Provider   indomethacin (INDOCIN) 50 MG capsule Take 1 capsule (50 mg total) by mouth 2 (two) times daily with a meal for 10 days. 20 capsule Bing Neighbors, NP      PDMP not reviewed this encounter.   Bing Neighbors, NP 09/15/22 [phone]

## 2022-09-10 NOTE — Discharge Instructions (Addendum)
As discussed you have either a joint effusion or a cyst on your ankle.  Follow-up at [facility] walk-in clinic tomorrow morning at 8:00 am.

## 2022-09-10 NOTE — ED Triage Notes (Signed)
Ankle pain and swelling onset 1 year ago, was told by her PCP to wear compression socks, onset Tuesday with increased swelling and sharp pain. No new falls or injuries.

## 2022-09-25 ENCOUNTER — Ambulatory Visit: Payer: 59 | Admitting: Nurse Practitioner

## 2022-09-25 NOTE — Progress Notes (Deleted)
Madelaine Bhat, CMA,acting as a Neurosurgeon for Arnette Felts, FNP.,have documented all relevant documentation on the behalf of Arnette Felts, FNP,as directed by  Arnette Felts, FNP while in the presence of Arnette Felts, FNP.  Subjective:  Patient ID: Stephanie Garrison , female    DOB: 02-08-1966 , 57 y.o.   MRN: [id number]  No chief complaint on file.   HPI  Patient presents today for a bp, dm. chol follow up. Patient reports compliance with medications. Patient denies any chest pain, SOB, or headache. Patient has no concerns today.     Past Medical History:  Diagnosis Date  . Arthritis   . Diabetes mellitus without complication (HCC)   . GERD (gastroesophageal reflux disease)    OTC  . Heart murmur   . Hypertension   . Hypothyroidism   . Pre-diabetes   . RBBB (right bundle branch block)    recurrent, asymptomatic  . S/P CABG (coronary artery bypass graft) 1964   when she was 57 years old  . S/P repair of tetralogy of Fallot    2D ECHO, 10/10/2009 - EF >55%, normal to mild  . Thyroid disease    hypothyroidism     Family History  Problem Relation Age of Onset  . Hyperlipidemia Mother   . Breast cancer Cousin 76       maternal 1st cousin     Current Outpatient Medications:  .  acetaminophen (TYLENOL) 500 MG tablet, Take 1,000 mg by mouth every 6 (six) hours as needed for moderate pain or mild pain., Disp: , Rfl:  .  acetaminophen (TYLENOL) 650 MG CR tablet, Take 1,300 mg by mouth every 8 (eight) hours as needed for pain., Disp: , Rfl:  .  amLODipine (NORVASC) 5 MG tablet, TAKE 1 TABLET DAILY, Disp: 90 tablet, Rfl: 3 .  bisacodyl (DULCOLAX) 5 MG EC tablet, Take 5 mg by mouth daily as needed for moderate constipation. Exlax, Disp: , Rfl:  .  famotidine (PEPCID) 20 MG tablet, Take 20 mg by mouth as needed for heartburn or indigestion., Disp: , Rfl:  .  HYDROcodone-acetaminophen (NORCO) 5-325 MG tablet, Take 1-2 tablets by mouth every 6 (six) hours as needed for moderate pain or  severe pain., Disp: 20 tablet, Rfl: 0 .  levothyroxine (SYNTHROID) 50 MCG tablet, TAKE 1 TABLET DAILY, Disp: 90 tablet, Rfl: 3 .  metFORMIN (GLUCOPHAGE) 500 MG tablet, Take 1 tablet (500 mg total) by mouth 2 (two) times daily with a meal., Disp: 180 tablet, Rfl: 1 .  olmesartan-hydrochlorothiazide (BENICAR HCT) 40-25 MG tablet, TAKE 1 TABLET DAILY (NEED AN APPOINTMENT), Disp: 90 tablet, Rfl: 3 .  pravastatin (PRAVACHOL) 80 MG tablet, Take 1 tablet (80 mg total) by mouth daily., Disp: 90 tablet, Rfl: 1 .  Vitamin D, Ergocalciferol, (DRISDOL) 1.25 MG (50000 UNIT) CAPS capsule, TAKE 1 CAPSULE EVERY 7 DAYS (Patient taking differently: Take 50,000 Units by mouth every Sunday.), Disp: 12 capsule, Rfl: 3   No Known Allergies   Review of Systems   There were no vitals filed for this visit. There is no height or weight on file to calculate BMI.  Wt Readings from Last 3 Encounters:  09/10/22 169 lb (76.7 kg)  06/24/22 181 lb (82.1 kg)  04/14/22 173 lb (78.5 kg)    The 10-year ASCVD risk score (Arnett DK, et al., 2019) is: 1.9%   Values used to calculate the score:     Age: 8 years     Sex: Female  Is Non-Hispanic African American: Yes     Diabetic: No     Tobacco smoker: No     Systolic Blood Pressure: 105 mmHg     Is BP treated: Yes     HDL Cholesterol: 90 mg/dL     Total Cholesterol: 224 mg/dL  Objective:  Physical Exam      Assessment And Plan:  Essential hypertension  Abnormal glucose  Mixed hyperlipidemia    No follow-ups on file.  Patient was given opportunity to ask questions. Patient verbalized understanding of the plan and was able to repeat key elements of the plan. All questions were answered to their satisfaction.    Jeanell Sparrow, FNP, have reviewed all documentation for this visit. The documentation on 09/25/22 for the exam, diagnosis, procedures, and orders are all accurate and complete.   IF YOU HAVE BEEN REFERRED TO A SPECIALIST, IT MAY TAKE 1-2 WEEKS TO  SCHEDULE/PROCESS THE REFERRAL. IF YOU HAVE NOT HEARD FROM US/SPECIALIST IN TWO WEEKS, PLEASE GIVE [place] A CALL AT [phone].

## 2022-09-30 ENCOUNTER — Ambulatory Visit: Payer: 59 | Admitting: Nurse Practitioner

## 2022-10-08 ENCOUNTER — Other Ambulatory Visit: Payer: Self-pay | Admitting: Nurse Practitioner

## 2022-10-28 ENCOUNTER — Ambulatory Visit (INDEPENDENT_AMBULATORY_CARE_PROVIDER_SITE_OTHER): Payer: 59 | Admitting: Nurse Practitioner

## 2022-10-28 ENCOUNTER — Encounter: Payer: Self-pay | Admitting: Nurse Practitioner

## 2022-10-28 VITALS — BP 120/70 | HR 70 | Temp 98.4°F | Ht 61.0 in | Wt 173.2 lb

## 2022-10-28 DIAGNOSIS — E039 Hypothyroidism, unspecified: Secondary | ICD-10-CM | POA: Diagnosis not present

## 2022-10-28 DIAGNOSIS — E782 Mixed hyperlipidemia: Secondary | ICD-10-CM

## 2022-10-28 DIAGNOSIS — R7309 Other abnormal glucose: Secondary | ICD-10-CM | POA: Diagnosis not present

## 2022-10-28 DIAGNOSIS — E559 Vitamin D deficiency, unspecified: Secondary | ICD-10-CM

## 2022-10-28 DIAGNOSIS — Z85528 Personal history of other malignant neoplasm of kidney: Secondary | ICD-10-CM

## 2022-10-28 DIAGNOSIS — Z2821 Immunization not carried out because of patient refusal: Secondary | ICD-10-CM | POA: Insufficient documentation

## 2022-10-28 DIAGNOSIS — I10 Essential (primary) hypertension: Secondary | ICD-10-CM

## 2022-10-28 NOTE — Assessment & Plan Note (Signed)
Thyroid levels are normal. Continue current medications. Will check thyroid levels

## 2022-10-28 NOTE — Assessment & Plan Note (Signed)
Reports does not have to be seen by Urology for 5 years, I do not have any notes available to see that

## 2022-10-28 NOTE — Assessment & Plan Note (Signed)
HgbA1c is stable.

## 2022-10-28 NOTE — Progress Notes (Signed)
Stephanie Garrison, CMA,acting as a Neurosurgeon for Stephanie Felts, FNP.,have documented all relevant documentation on the behalf of Stephanie Felts, FNP,as directed by  Stephanie Felts, FNP while in the presence of Stephanie Felts, FNP.  Subjective:  Patient ID: Stephanie Garrison , female    DOB: Aug 14, 1965 , 57 y.o.   MRN: [id number]  Chief Complaint  Patient presents with   Hypertension    HPI  Patient presents today for a BP, Chol, and Pre DM.Patient reports compliance with medications. Patient denies any Chest pain, SOB, and headaches. Patient has no concerns today. She admits to drinking adequate amount of water. She does admit to eating ham and bacon this morning.   BP Readings from Last 3 Encounters: 10/28/22 : (!) 142/80 09/10/22 : 105/78 06/24/22 : 130/70    Hypertension This is a chronic problem. The current episode started more than 1 year ago. The problem is unchanged. The problem is controlled. Pertinent negatives include no chest pain, headaches, palpitations or shortness of breath. There are no associated agents to hypertension. Risk factors for coronary artery disease include obesity. Past treatments include diuretics and angiotensin blockers. The current treatment provides moderate improvement. Compliance problems include exercise and diet.  There is no history of angina or kidney disease. There is no history of chronic renal disease.     Past Medical History:  Diagnosis Date   Arthritis    Diabetes mellitus without complication (HCC)    GERD (gastroesophageal reflux disease)    OTC   Heart murmur    Hypertension    Hypothyroidism    Pre-diabetes    RBBB (right bundle branch block)    recurrent, asymptomatic   S/P CABG (coronary artery bypass graft) 1964   when she was 57 years old   S/P repair of tetralogy of Fallot    2D ECHO, 10/10/2009 - EF >55%, normal to mild   Thyroid disease    hypothyroidism     Family History  Problem Relation Age of Onset   Hyperlipidemia Mother     Breast cancer Cousin 64       maternal 1st cousin     Current Outpatient Medications:    acetaminophen (TYLENOL) 500 MG tablet, Take 1,000 mg by mouth every 6 (six) hours as needed for moderate pain or mild pain., Disp: , Rfl:    acetaminophen (TYLENOL) 650 MG CR tablet, Take 1,300 mg by mouth every 8 (eight) hours as needed for pain., Disp: , Rfl:    amLODipine (NORVASC) 5 MG tablet, TAKE 1 TABLET DAILY, Disp: 90 tablet, Rfl: 3   bisacodyl (DULCOLAX) 5 MG EC tablet, Take 5 mg by mouth daily as needed for moderate constipation. Exlax, Disp: , Rfl:    HYDROcodone-acetaminophen (NORCO) 5-325 MG tablet, Take 1-2 tablets by mouth every 6 (six) hours as needed for moderate pain or severe pain., Disp: 20 tablet, Rfl: 0   levothyroxine (SYNTHROID) 50 MCG tablet, TAKE 1 TABLET DAILY, Disp: 90 tablet, Rfl: 3   metFORMIN (GLUCOPHAGE) 500 MG tablet, Take 1 tablet (500 mg total) by mouth 2 (two) times daily with a meal., Disp: 180 tablet, Rfl: 1   olmesartan-hydrochlorothiazide (BENICAR HCT) 40-25 MG tablet, TAKE 1 TABLET DAILY (NEED AN APPOINTMENT), Disp: 90 tablet, Rfl: 3   pravastatin (PRAVACHOL) 80 MG tablet, TAKE 1 TABLET DAILY, Disp: 90 tablet, Rfl: 3   Vitamin D, Ergocalciferol, (DRISDOL) 1.25 MG (50000 UNIT) CAPS capsule, TAKE 1 CAPSULE EVERY 7 DAYS (Patient taking differently: Take 50,000 Units by mouth  every Sunday.), Disp: 12 capsule, Rfl: 3   famotidine (PEPCID) 20 MG tablet, Take 20 mg by mouth as needed for heartburn or indigestion. (Patient not taking: Reported on 10/28/2022), Disp: , Rfl:    No Known Allergies   Review of Systems  Constitutional: Negative.   HENT: Negative.    Eyes: Negative.   Respiratory: Negative.  Negative for shortness of breath.   Cardiovascular: Negative.  Negative for chest pain and palpitations.  Gastrointestinal: Negative.   Neurological:  Negative for headaches.  Psychiatric/Behavioral: Negative.       Today's Vitals   10/28/22 1059 10/28/22 1122  BP:  (!) 142/80 120/70  Pulse: 70   Temp: 98.4 F (36.9 C)   TempSrc: Oral   Weight: 173 lb 3.2 oz (78.6 kg)   Height: 5\' 1"  (1.549 m)   PainSc: 0-No pain    Body mass index is 32.73 kg/m.  Wt Readings from Last 3 Encounters:  10/28/22 173 lb 3.2 oz (78.6 kg)  09/10/22 169 lb (76.7 kg)  06/24/22 181 lb (82.1 kg)     Objective:  Physical Exam Vitals reviewed.  Constitutional:      General: She is not in acute distress.    Appearance: Normal appearance. She is obese.  Cardiovascular:     Rate and Rhythm: Normal rate and regular rhythm.     Pulses: Normal pulses.     Heart sounds: Murmur heard.  Pulmonary:     Effort: Pulmonary effort is normal. No respiratory distress.     Breath sounds: Normal breath sounds. No wheezing.  Skin:    General: Skin is warm and dry.     Capillary Refill: Capillary refill takes less than 2 seconds.  Neurological:     General: No focal deficit present.     Mental Status: She is alert and oriented to person, place, and time.     Cranial Nerves: No cranial nerve deficit.     Motor: No weakness.  Psychiatric:        Mood and Affect: Mood normal.        Behavior: Behavior normal.        Thought Content: Thought content normal.        Judgment: Judgment normal.         Assessment And Plan:  Essential hypertension Assessment & Plan: Blood pressure was elevated, repeat is within normal limits. Discussed importance of limiting intake of salt.   Orders: -     CMP14+EGFR  Mixed hyperlipidemia Assessment & Plan: Cholesterol levels are stable. Encouraged to limit intake of high fat foods.   Orders: -     Lipid panel  Abnormal glucose Assessment & Plan: HgbA1c is stable.   Orders: -     Hemoglobin A1c  Acquired hypothyroidism Assessment & Plan: Thyroid levels are normal. Continue current medications. Will check thyroid levels  Orders: -     TSH + free T4  Vitamin D deficiency Assessment & Plan: Will check vitamin D level and  supplement as needed.    Also encouraged to spend 15 minutes in the sun daily.     Influenza vaccination declined Assessment & Plan: Patient declined influenza vaccination at this time. Patient is aware that influenza vaccine prevents illness in 70% of healthy people, and reduces hospitalizations to 30-70% in elderly. This vaccine is recommended annually. Education has been provided regarding the importance of this vaccine but patient still declined. Advised may receive this vaccine at local pharmacy or Health Dept.or vaccine clinic. Aware  to provide a copy of the vaccination record if obtained from local pharmacy or Health Dept.  Pt is willing to accept risk associated with refusing vaccination.    Herpes zoster vaccination declined Assessment & Plan: Declines shingrix, educated on disease process and is aware if he changes his mind to notify office    History of renal cell carcinoma Assessment & Plan: Reports does not have to be seen by Urology for 5 years, I do not have any notes available to see that    Return for uncontrolled bp 3-56months check.   Patient was given opportunity to ask questions. Patient verbalized understanding of the plan and was able to repeat key elements of the plan. All questions were answered to their satisfaction.    Jeanell Sparrow, FNP, have reviewed all documentation for this visit. The documentation on 10/28/22 for the exam, diagnosis, procedures, and orders are all accurate and complete.   IF YOU HAVE BEEN REFERRED TO A SPECIALIST, IT MAY TAKE 1-2 WEEKS TO SCHEDULE/PROCESS THE REFERRAL. IF YOU HAVE NOT HEARD FROM US/SPECIALIST IN TWO WEEKS, PLEASE GIVE [place] A CALL AT [phone] X [phone].

## 2022-10-28 NOTE — Assessment & Plan Note (Signed)
Cholesterol levels are stable. Encouraged to limit intake of high fat foods.

## 2022-10-28 NOTE — Assessment & Plan Note (Signed)
Will check vitamin D level and supplement as needed.    Also encouraged to spend 15 minutes in the sun daily.   

## 2022-10-28 NOTE — Assessment & Plan Note (Signed)

## 2022-10-28 NOTE — Assessment & Plan Note (Signed)
Declines shingrix, educated on disease process and is aware if he changes his mind to notify office  

## 2022-10-28 NOTE — Assessment & Plan Note (Signed)
Blood pressure was elevated, repeat is within normal limits. Discussed importance of limiting intake of salt.

## 2022-10-29 LAB — LIPID PANEL
Chol/HDL Ratio: 2.7 ratio (ref 0.0–4.4)
Cholesterol, Total: 190 mg/dL (ref 100–199)
HDL: 71 mg/dL (ref >39)
LDL Chol Calc (NIH): 93 mg/dL (ref 0–99)
Triglycerides: 155 mg/dL — ABNORMAL HIGH (ref 0–149)
VLDL Cholesterol Cal: 26 mg/dL (ref 5–40)

## 2022-10-29 LAB — HEMOGLOBIN A1C
Est. average glucose Bld gHb Est-mCnc: 123 mg/dL
Hgb A1c MFr Bld: 5.9 % — ABNORMAL HIGH (ref 4.8–5.6)

## 2022-10-29 LAB — CMP14+EGFR
ALT: 19 IU/L (ref 0–32)
AST: 31 IU/L (ref 0–40)
Albumin: 4.2 g/dL (ref 3.8–4.9)
Alkaline Phosphatase: 60 IU/L (ref 44–121)
BUN/Creatinine Ratio: 22 (ref 9–23)
BUN: 17 mg/dL (ref 6–24)
Bilirubin Total: 0.5 mg/dL (ref 0.0–1.2)
CO2: 23 mmol/L (ref 20–29)
Calcium: 9.3 mg/dL (ref 8.7–10.2)
Chloride: 101 mmol/L (ref 96–106)
Creatinine, Ser: 0.79 mg/dL (ref 0.57–1.00)
Globulin, Total: 2.7 g/dL (ref 1.5–4.5)
Glucose: 92 mg/dL (ref 70–99)
Potassium: 3.5 mmol/L (ref 3.5–5.2)
Sodium: 138 mmol/L (ref 134–144)
Total Protein: 6.9 g/dL (ref 6.0–8.5)
eGFR: 88 mL/min/{1.73_m2} (ref >59)

## 2022-10-29 LAB — TSH+FREE T4
Free T4: 1.77 ng/dL (ref 0.82–1.77)
TSH: 3.02 u[IU]/mL (ref 0.450–4.500)

## 2022-11-02 ENCOUNTER — Ambulatory Visit: Payer: 59 | Admitting: Nurse Practitioner

## 2022-11-30 ENCOUNTER — Other Ambulatory Visit: Payer: Self-pay | Admitting: Nurse Practitioner

## 2022-12-16 ENCOUNTER — Ambulatory Visit: Payer: 59 | Admitting: Cardiovascular Disease

## 2022-12-28 ENCOUNTER — Encounter: Payer: Self-pay | Admitting: Cardiovascular Disease

## 2022-12-28 ENCOUNTER — Ambulatory Visit: Payer: 59 | Attending: Cardiovascular Disease | Admitting: Cardiovascular Disease

## 2022-12-28 VITALS — BP 118/78 | HR 72 | Ht 62.0 in | Wt 174.0 lb

## 2022-12-28 DIAGNOSIS — I10 Essential (primary) hypertension: Secondary | ICD-10-CM | POA: Diagnosis not present

## 2022-12-28 DIAGNOSIS — I4510 Unspecified right bundle-branch block: Secondary | ICD-10-CM | POA: Diagnosis not present

## 2022-12-28 DIAGNOSIS — Q249 Congenital malformation of heart, unspecified: Secondary | ICD-10-CM | POA: Diagnosis not present

## 2022-12-28 NOTE — Assessment & Plan Note (Signed)
History of essential hypertension her blood pressure measured at 120/70.  She is on amlodipine, Benicar and hydrochlorothiazide.

## 2022-12-28 NOTE — Patient Instructions (Signed)
    Follow-Up: At [facility], you and your health needs are our priority.  As part of our continuing mission to provide you with exceptional heart care, we have created designated Provider Care Teams.  These Care Teams include your primary Cardiologist (physician) and Advanced Practice Providers (APPs -  Physician Assistants and Nurse Practitioners) who all work together to provide you with the care you need, when you need it.  We recommend signing up for the patient portal called "MyChart".  Sign up information is provided on this After Visit Summary.  MyChart is used to connect with patients for Virtual Visits (Telemedicine).  Patients are able to view lab/test results, encounter notes, upcoming appointments, etc.  Non-urgent messages can be sent to your provider as well.   To learn more about what you can do with MyChart, go to [email or web address].    Your next appointment:   12 month(s)  Provider:   Jonathan Berry, MD      

## 2022-12-28 NOTE — Assessment & Plan Note (Signed)
Chronic. 

## 2022-12-28 NOTE — Assessment & Plan Note (Signed)
History of repaired tetralogy of Fallot with echo performed 01/09/2021 revealing normal LV systolic function without evidence of residual VSD.

## 2022-12-28 NOTE — Progress Notes (Signed)
12/28/2022 Lucky Cowboy   28-Feb-1965  [id number]  Primary Physician Arnette Felts, FNP Primary Cardiologist: Runell Gess MD Milagros Loll, [place], [place]  HPI:  Stephanie Garrison is a 57 y.o.  mildly overweight married [place] American female, mother of 1 child, who I last saw 07/29/2021. 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 Her other problems include hypertension, chronic right bundle branch block. She has been asymptomatic since I saw her last except for 1 episode of what sounds like PAF as a result of excessive alcohol intake.    She was complaining of some shortness of breath and palpitations however these symptoms have resolved.  An event monitor showed occasional PACs, PVCs and short runs of SVT but she no longer complains of palpitations.  2D echo performed 01/09/2021 was essentially normal without evidence of residual VSD, normal LV systolic function and normal valvular function.  Since I saw her a year and a half ago she is remained stable.  She did have a partial left nephrectomy in March of this year because of renal cell cancer.  This was a successful procedure.  She otherwise denies chest pain or shortness of breath.   Current Meds  Medication Sig   acetaminophen (TYLENOL) 500 MG tablet Take 1,000 mg by mouth every 6 (six) hours as needed for moderate pain or mild pain.   amLODipine (NORVASC) 5 MG tablet TAKE 1 TABLET DAILY   famotidine (PEPCID) 20 MG tablet Take 20 mg by mouth as needed for heartburn or indigestion.   levothyroxine (SYNTHROID) 50 MCG tablet TAKE 1 TABLET DAILY   metFORMIN (GLUCOPHAGE) 500 MG tablet Take 1 tablet (500 mg total) by mouth 2 (two) times daily with a meal.   olmesartan-hydrochlorothiazide (BENICAR HCT) 40-25 MG tablet TAKE 1 TABLET DAILY (NEED AN APPOINTMENT)   pravastatin (PRAVACHOL) 80 MG tablet TAKE 1 TABLET DAILY   Vitamin D, Ergocalciferol, (DRISDOL) 1.25 MG (50000 UNIT) CAPS capsule Take 1 capsule  (50,000 Units total) by mouth every Sunday.     No Known Allergies  Social History   Socioeconomic History   Marital status: Married    Spouse name: Not on file   Number of children: Not on file   Years of education: Not on file   Highest education level: Not on file  Occupational History   Not on file  Tobacco Use   Smoking status: Never   Smokeless tobacco: Never  Vaping Use   Vaping status: Never Used  Substance and Sexual Activity   Alcohol use: Yes    Alcohol/week: 6.0 standard drinks of alcohol    Types: 6 Cans of beer per week    Comment: 3 drinks tonight or more    Drug use: No   Sexual activity: Yes  Other Topics Concern   Not on file  Social History Narrative   Not on file   Social Determinants of Health   Financial Resource Strain: Not on file  Food Insecurity: No Food Insecurity (03/13/2022)   Hunger Vital Sign    Worried About Running Out of Food in the Last Year: Never true    Ran Out of Food in the Last Year: Never true  Transportation Needs: No Transportation Needs (03/13/2022)   PRAPARE - Administrator, Civil Service (Medical): No    Lack of Transportation (Non-Medical): No  Physical Activity: Insufficiently Active (06/11/2021)   Exercise Vital Sign    Days of Exercise  per Week: 2 days    Minutes of Exercise per Session: 20 min  Stress: Not on file  Social Connections: Not on file  Intimate Partner Violence: Not At Risk (03/13/2022)   Humiliation, Afraid, Rape, and Kick questionnaire    Fear of Current or Ex-Partner: No    Emotionally Abused: No    Physically Abused: No    Sexually Abused: No     Review of Systems: General: negative for chills, fever, night sweats or weight changes.  Cardiovascular: negative for chest pain, dyspnea on exertion, edema, orthopnea, palpitations, paroxysmal nocturnal dyspnea or shortness of breath Dermatological: negative for rash Respiratory: negative for cough or wheezing Urologic: negative for  hematuria Abdominal: negative for nausea, vomiting, diarrhea, bright red blood per rectum, melena, or hematemesis Neurologic: negative for visual changes, syncope, or dizziness All other systems reviewed and are otherwise negative except as noted above.    Blood pressure 118/78, pulse 72, height 5\' 2"  (1.575 m), weight 174 lb (78.9 kg).  General appearance: alert and no distress Neck: no adenopathy, no carotid bruit, no JVD, supple, symmetrical, trachea midline, and thyroid not enlarged, symmetric, no tenderness/mass/nodules Lungs: clear to auscultation bilaterally Heart: regular rate and rhythm, S1, S2 normal, no murmur, click, rub or gallop Extremities: extremities normal, atraumatic, no cyanosis or edema Pulses: 2+ and symmetric Skin: Skin color, texture, turgor normal. No rashes or lesions Neurologic: Grossly normal  EKG EKG Interpretation Date/Time:  Monday December 28 2022 13:50:16 EST Ventricular Rate:  67 PR Interval:  148 QRS Duration:  144 QT Interval:  482 QTC Calculation: 509 R Axis:   9  Text Interpretation: Normal sinus rhythm Right bundle branch block Confirmed by Nanetta Batty [id number]) on 12/28/2022 2:00:51 PM    ASSESSMENT AND PLAN:   Right bundle branch block Chronic  Essential hypertension History of essential hypertension her blood pressure measured at 120/70.  She is on amlodipine, Benicar and hydrochlorothiazide.  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     Runell Gess MD FACP,FACC,FAHA, [facility] 12/28/2022 2:06 PM

## 2023-01-27 ENCOUNTER — Ambulatory Visit: Payer: 59 | Admitting: Nurse Practitioner

## 2023-02-10 ENCOUNTER — Other Ambulatory Visit: Payer: Self-pay | Admitting: Nurse Practitioner

## 2023-02-10 DIAGNOSIS — R7309 Other abnormal glucose: Secondary | ICD-10-CM

## 2023-02-23 ENCOUNTER — Ambulatory Visit: Payer: 59 | Admitting: Nurse Practitioner

## 2023-02-23 ENCOUNTER — Encounter: Payer: Self-pay | Admitting: Nurse Practitioner

## 2023-02-23 VITALS — BP 120/80 | HR 75 | Temp 98.4°F | Ht 62.0 in | Wt 169.0 lb

## 2023-02-23 DIAGNOSIS — R109 Unspecified abdominal pain: Secondary | ICD-10-CM

## 2023-02-23 DIAGNOSIS — Z2821 Immunization not carried out because of patient refusal: Secondary | ICD-10-CM

## 2023-02-23 DIAGNOSIS — I10 Essential (primary) hypertension: Secondary | ICD-10-CM

## 2023-02-23 DIAGNOSIS — R350 Frequency of micturition: Secondary | ICD-10-CM | POA: Diagnosis not present

## 2023-02-23 DIAGNOSIS — Z6830 Body mass index (BMI) 30.0-30.9, adult: Secondary | ICD-10-CM

## 2023-02-23 DIAGNOSIS — R7309 Other abnormal glucose: Secondary | ICD-10-CM

## 2023-02-23 DIAGNOSIS — E782 Mixed hyperlipidemia: Secondary | ICD-10-CM

## 2023-02-23 DIAGNOSIS — E66811 Obesity, class 1: Secondary | ICD-10-CM

## 2023-02-23 DIAGNOSIS — E6609 Other obesity due to excess calories: Secondary | ICD-10-CM

## 2023-02-23 DIAGNOSIS — R319 Hematuria, unspecified: Secondary | ICD-10-CM | POA: Insufficient documentation

## 2023-02-23 LAB — POCT URINALYSIS DIPSTICK
Bilirubin, UA: NEGATIVE
Glucose, UA: NEGATIVE
Ketones, UA: NEGATIVE
Leukocytes, UA: NEGATIVE
Nitrite, UA: NEGATIVE
Protein, UA: NEGATIVE
Spec Grav, UA: 1.02 (ref 1.010–1.025)
Urobilinogen, UA: 0.2 U/dL
pH, UA: 6 (ref 5.0–8.0)

## 2023-02-23 MED ORDER — OLMESARTAN MEDOXOMIL-HCTZ 40-25 MG PO TABS: 1.0000 | ORAL_TABLET | Freq: Every day | ORAL | 1 refills | Status: DC

## 2023-02-23 NOTE — Assessment & Plan Note (Signed)
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 Discussed risk of heart events and increasing blood pressure with Sleep apnea

## 2023-02-23 NOTE — Assessment & Plan Note (Signed)
 She is encouraged to strive for BMI less than 30 to decrease cardiac risk. Advised to aim for at least 150 minutes of exercise per week. Congratulated on her 5 lb weight loss

## 2023-02-23 NOTE — Assessment & Plan Note (Signed)
 Declines shingrix, educated on disease process and is aware if he changes his mind to notify office

## 2023-02-23 NOTE — Assessment & Plan Note (Signed)
 HgbA1c is stable. Will check HgbA1c

## 2023-02-23 NOTE — Patient Instructions (Signed)
Be on the lookout for [employer] to call for an at home sleep study

## 2023-02-23 NOTE — Assessment & Plan Note (Signed)
Moderate blood will send for culture

## 2023-02-23 NOTE — Assessment & Plan Note (Signed)
 Blood pressure is controlled. Continue current medications.

## 2023-02-23 NOTE — Assessment & Plan Note (Signed)
Cholesterol levels are stable. Encouraged to limit intake of high fat foods.

## 2023-02-23 NOTE — Assessment & Plan Note (Addendum)
She is having sharp pains to her left flank, history of renal carcinoma and nephrectomy.  Will check urinalysis. I have advised her to contact urology as well to provide an update

## 2023-02-23 NOTE — Progress Notes (Signed)
 LILLETTE Kristeen JINNY Gladis, CMA,acting as a neurosurgeon for Stephanie Ada, FNP.,have documented all relevant documentation on the behalf of Stephanie Ada, FNP,as directed by  Stephanie Ada, FNP while in the presence of Stephanie Ada, FNP.  Subjective:  Patient ID: Stephanie Garrison , female    DOB: [birth year] , 57 y.o.   MRN: [id number]  Chief Complaint  Patient presents with   Hypertension    HPI  Patient presents today for a bp and chol follow up, Patient reports compliance with medication. Patient denies any chest pain, SOB, or headaches. Patient complains of pain in her left kidney. She noticed the pain about a month ago. The pain comes and goes. She is not in pain today.   Hypertension This is a chronic problem. The current episode started more than 1 year ago. The problem is unchanged. The problem is controlled. Pertinent negatives include no chest pain, headaches, palpitations or shortness of breath. There are no associated agents to hypertension. Risk factors for coronary artery disease include obesity. Past treatments include diuretics and angiotensin blockers. The current treatment provides moderate improvement. Compliance problems include exercise and diet.  There is no history of angina or kidney disease. There is no history of chronic renal disease.     Past Medical History:  Diagnosis Date   Arthritis    Diabetes mellitus without complication (HCC)    GERD (gastroesophageal reflux disease)    OTC   Heart murmur    Hypertension    Hypothyroidism    Pre-diabetes    RBBB (right bundle branch block)    recurrent, asymptomatic   S/P CABG (coronary artery bypass graft) 1964   when she was 57 years old   S/P repair of tetralogy of Fallot    2D ECHO, 10/10/2009 - EF >55%, normal to mild   Thyroid disease    hypothyroidism     Family History  Problem Relation Age of Onset   Hyperlipidemia Mother    Breast cancer Cousin 38       maternal 1st cousin     Current Outpatient Medications:     acetaminophen  (TYLENOL ) 500 MG tablet, Take 1,000 mg by mouth every 6 (six) hours as needed for moderate pain or mild pain., Disp: , Rfl:    amLODipine  (NORVASC ) 5 MG tablet, TAKE 1 TABLET DAILY, Disp: 90 tablet, Rfl: 3   bisacodyl (DULCOLAX) 5 MG EC tablet, Take 5 mg by mouth daily as needed for moderate constipation. Exlax, Disp: , Rfl:    famotidine  (PEPCID ) 20 MG tablet, Take 20 mg by mouth as needed for heartburn or indigestion., Disp: , Rfl:    levothyroxine  (SYNTHROID ) 50 MCG tablet, TAKE 1 TABLET DAILY, Disp: 90 tablet, Rfl: 3   metFORMIN  (GLUCOPHAGE ) 500 MG tablet, TAKE 1 TABLET BY MOUTH 2 TIMES DAILY WITH A MEAL., Disp: 180 tablet, Rfl: 0   pravastatin  (PRAVACHOL ) 80 MG tablet, TAKE 1 TABLET DAILY, Disp: 90 tablet, Rfl: 3   Vitamin D , Ergocalciferol , (DRISDOL ) 1.25 MG (50000 UNIT) CAPS capsule, Take 1 capsule (50,000 Units total) by mouth every Sunday., Disp: 12 capsule, Rfl: 3   olmesartan -hydrochlorothiazide (BENICAR  HCT) 40-25 MG tablet, Take 1 tablet by mouth daily., Disp: 90 tablet, Rfl: 1   No Known Allergies   Review of Systems  Constitutional: Negative.   HENT: Negative.    Eyes: Negative.   Respiratory: Negative.  Negative for shortness of breath.   Cardiovascular: Negative.  Negative for chest pain and palpitations.  Gastrointestinal: Negative.   Neurological:  Negative for headaches.  Psychiatric/Behavioral: Negative.       Today's Vitals   02/23/23 1146  BP: 120/80  Pulse: 75  Temp: 98.4 F (36.9 C)  TempSrc: Oral  Weight: 169 lb (76.7 kg)  Height: 5' 2 (1.575 m)  PainSc: 0-No pain   Body mass index is 30.91 kg/m.  Wt Readings from Last 3 Encounters:  02/23/23 169 lb (76.7 kg)  12/28/22 174 lb (78.9 kg)  10/28/22 173 lb 3.2 oz (78.6 kg)     Objective:  Physical Exam Vitals reviewed.  Constitutional:      General: She is not in acute distress.    Appearance: Normal appearance. She is obese.  Cardiovascular:     Rate and Rhythm: Normal rate and  regular rhythm.     Pulses: Normal pulses.     Heart sounds: Murmur heard.  Pulmonary:     Effort: Pulmonary effort is normal. No respiratory distress.     Breath sounds: Normal breath sounds. No wheezing.  Skin:    General: Skin is warm and dry.     Capillary Refill: Capillary refill takes less than 2 seconds.  Neurological:     General: No focal deficit present.     Mental Status: She is alert and oriented to person, place, and time.     Cranial Nerves: No cranial nerve deficit.     Motor: No weakness.  Psychiatric:        Mood and Affect: Mood normal.        Behavior: Behavior normal.        Thought Content: Thought content normal.        Judgment: Judgment normal.       Assessment And Plan:  Essential hypertension Assessment & Plan: Blood pressure is controlled. Continue current medications.   Orders: -     BMP8+eGFR  Mixed hyperlipidemia Assessment & Plan: Cholesterol levels are stable. Encouraged to limit intake of high fat foods.   Orders: -     Lipid panel  Abnormal glucose Assessment & Plan: HgbA1c is stable. Will check HgbA1c    Urinary frequency Assessment & Plan: Since this occurs when she is sleeping even during the day since she works at night, I think it is reasonable to do an at home sleep study. Discussed risk of heart events and increasing blood pressure with Sleep apnea  Orders: -     POCT urinalysis dipstick -     Olmesartan  Medoxomil-HCTZ; Take 1 tablet by mouth daily.  Dispense: 90 tablet; Refill: 1  Herpes zoster vaccination declined Assessment & Plan: Declines shingrix, educated on disease process and is aware if he changes his mind to notify office    Class 1 obesity due to excess calories with serious comorbidity and body mass index (BMI) of 30.0 to 30.9 in adult Assessment & Plan: She is encouraged to strive for BMI less than 30 to decrease cardiac risk. Advised to aim for at least 150 minutes of exercise per week. Congratulated on  her 5 lb weight loss   Left flank pain Assessment & Plan: She is having sharp pains to her left flank, history of renal carcinoma and nephrectomy.  Will check urinalysis. I have advised her to contact urology as well to provide an update   Hematuria, unspecified type Assessment & Plan: Moderate blood will send for culture  Orders: -     Urine Culture   Return if symptoms worsen or fail to improve.   Patient was given opportunity to  ask questions. Patient verbalized understanding of the plan and was able to repeat key elements of the plan. All questions were answered to their satisfaction.    LILLETTE Stephanie Ada, FNP, have reviewed all documentation for this visit. The documentation on 02/23/23 for the exam, diagnosis, procedures, and orders are all accurate and complete.   IF YOU HAVE BEEN REFERRED TO A SPECIALIST, IT MAY TAKE 1-2 WEEKS TO SCHEDULE/PROCESS THE REFERRAL. IF YOU HAVE NOT HEARD FROM US /SPECIALIST IN TWO WEEKS, PLEASE GIVE US  A CALL AT [phone].

## 2023-02-24 LAB — BMP8+EGFR
BUN/Creatinine Ratio: 29 — ABNORMAL HIGH (ref 9–23)
BUN: 18 mg/dL (ref 6–24)
CO2: 27 mmol/L (ref 20–29)
Calcium: 9.4 mg/dL (ref 8.7–10.2)
Chloride: 101 mmol/L (ref 96–106)
Creatinine, Ser: 0.62 mg/dL (ref 0.57–1.00)
Glucose: 100 mg/dL — ABNORMAL HIGH (ref 70–99)
Potassium: 3.7 mmol/L (ref 3.5–5.2)
Sodium: 142 mmol/L (ref 134–144)
eGFR: 104 mL/min/{1.73_m2} (ref >59)

## 2023-02-24 LAB — LIPID PANEL
Chol/HDL Ratio: 2.7 {ratio} (ref 0.0–4.4)
Cholesterol, Total: 220 mg/dL — ABNORMAL HIGH (ref 100–199)
HDL: 82 mg/dL (ref >39)
LDL Chol Calc (NIH): 121 mg/dL — ABNORMAL HIGH (ref 0–99)
Triglycerides: 99 mg/dL (ref 0–149)
VLDL Cholesterol Cal: 17 mg/dL (ref 5–40)

## 2023-02-25 LAB — URINE CULTURE: Organism ID, Bacteria: NO GROWTH

## 2023-03-18 ENCOUNTER — Encounter: Payer: Self-pay | Admitting: Nurse Practitioner

## 2023-03-18 ENCOUNTER — Other Ambulatory Visit: Payer: Self-pay

## 2023-03-18 DIAGNOSIS — R350 Frequency of micturition: Secondary | ICD-10-CM

## 2023-03-18 MED ORDER — OLMESARTAN MEDOXOMIL-HCTZ 40-25 MG PO TABS
1.0000 | ORAL_TABLET | Freq: Every day | ORAL | 1 refills | Status: DC
Start: 2023-03-18 — End: 2023-10-28

## 2023-03-19 ENCOUNTER — Other Ambulatory Visit: Payer: Self-pay

## 2023-03-19 MED ORDER — AMLODIPINE BESYLATE 5 MG PO TABS: 5.0000 mg | ORAL_TABLET | Freq: Every day | ORAL | 3 refills | Status: DC

## 2023-04-15 ENCOUNTER — Telehealth: Payer: Self-pay

## 2023-04-15 ENCOUNTER — Ambulatory Visit: Payer: Self-pay | Admitting: Nurse Practitioner

## 2023-04-15 NOTE — Telephone Encounter (Signed)
  Chief Complaint: acid reflux Symptoms: indigestion after eating Frequency: a few days Pertinent Negatives: Patient denies CP, SOB, NVD Disposition: [] ED /[] Urgent Care (no appt availability in office) / [x] Appointment(In office/virtual)/ []  [place] [facility]/ [] Home Care/ [x] Refused Recommended Disposition /[] [place] [facility]/ []  Follow-up with PCP Additional Notes: Patient calls reporting indigestion that is unrelieved by OTC antacid for the last few days. States it occurs around 30 minutes after eating or when lying down. Denies pain. Per protocol, patient to be evaluated within 24 hours. Availability today, patient declines visit and requests PCP call medication in. Advised patient that I can send the request, but a visit may still be required. Care advice reviewed, patient verbalized understandingand denies further questions at this time. Alerting PCP for review.    Copied from CRM [id number]. Topic: Clinical - Red Word Triage >> Apr 15, 2023  2:51 PM Phill Myron wrote: Red Word that prompted transfer to Nurse Triage: burning in throat and chest area, two days Reason for Disposition  [1] MODERATE pain (e.g., interferes with normal activities) AND [2] comes and goes (cramps) AND [3] present > 24 hours  (Exception: Pain with Vomiting or Diarrhea - see that Guideline.)  Answer Assessment - Initial Assessment Questions 1. LOCATION: "Where does it hurt?"      In throat, and chest after eating 2. RADIATION: "Does the pain shoot anywhere else?" (e.g., chest, back)     Denies 3. ONSET: "When did the pain begin?" (e.g., minutes, hours or days ago)      Chronic, but worsening the last few days 4. SUDDEN: "Gradual or sudden onset?"     Approx 30 minutes after eating 5. PATTERN "Does the pain come and go, or is it constant?"    - If it comes and goes: "How long does it last?" "Do you have pain now?"     (Note: Comes and goes means the pain is intermittent. It goes away  completely between bouts.)    - If constant: "Is it getting better, staying the same, or getting worse?"      (Note: Constant means the pain never goes away completely; most serious pain is constant and gets worse.)      Comes and goes with meals 6. SEVERITY: "How bad is the pain?"  (e.g., Scale 1-10; mild, moderate, or severe)    - MILD (1-3): Doesn't interfere with normal activities, abdomen soft and not tender to touch..     - MODERATE (4-7): Interferes with normal activities or awakens from sleep, abdomen tender to touch.     - SEVERE (8-10): Excruciating pain, doubled over, unable to do any normal activities.       Denies 7. RECURRENT SYMPTOM: "Have you ever had this type of stomach pain before?" If Yes, ask: "When was the last time?" and "What happened that time?"      Yes, acid reflux 8. AGGRAVATING FACTORS: "Does anything seem to cause this pain?" (e.g., foods, stress, alcohol)     Food, lying down 9. CARDIAC SYMPTOMS: "Do you have any of the following symptoms: chest pain, difficulty breathing, sweating, nausea?"     Denies 10. OTHER SYMPTOMS: "Do you have any other symptoms?" (e.g., back pain, diarrhea, fever, urination pain, vomiting)       Denies  Protocols used: Abdominal Pain - Upper-A-AH

## 2023-04-15 NOTE — Telephone Encounter (Signed)
Patient called about heartburn- patient reports having heartburn for a year. Patient was advised to try Omeprazole OTC. If she is not better by next week call for appt.

## 2023-05-07 ENCOUNTER — Other Ambulatory Visit: Payer: Self-pay | Admitting: Nurse Practitioner

## 2023-05-07 DIAGNOSIS — R7309 Other abnormal glucose: Secondary | ICD-10-CM

## 2023-06-16 ENCOUNTER — Other Ambulatory Visit: Payer: Self-pay | Admitting: Nurse Practitioner

## 2023-06-29 ENCOUNTER — Encounter: Payer: 59 | Admitting: Nurse Practitioner

## 2023-06-30 ENCOUNTER — Encounter: Payer: 59 | Admitting: Nurse Practitioner

## 2023-06-30 ENCOUNTER — Other Ambulatory Visit: Payer: Self-pay | Admitting: Nurse Practitioner

## 2023-07-08 ENCOUNTER — Encounter: Payer: 59 | Admitting: Nurse Practitioner

## 2023-07-13 ENCOUNTER — Other Ambulatory Visit (HOSPITAL_COMMUNITY)
Admission: RE | Admit: 2023-07-13 | Discharge: 2023-07-13 | Disposition: A | Discharge status: Home / Self Care | Source: Ambulatory Visit | Attending: Nurse Practitioner | Admitting: Nurse Practitioner

## 2023-07-13 ENCOUNTER — Encounter: Payer: Self-pay | Admitting: Nurse Practitioner

## 2023-07-13 ENCOUNTER — Ambulatory Visit: Admitting: Nurse Practitioner

## 2023-07-13 VITALS — BP 120/70 | HR 71 | Temp 98.6°F | Ht 62.0 in | Wt 171.4 lb

## 2023-07-13 DIAGNOSIS — Z0000 Encounter for general adult medical examination without abnormal findings: Secondary | ICD-10-CM

## 2023-07-13 DIAGNOSIS — E039 Hypothyroidism, unspecified: Secondary | ICD-10-CM

## 2023-07-13 DIAGNOSIS — Z2821 Immunization not carried out because of patient refusal: Secondary | ICD-10-CM

## 2023-07-13 DIAGNOSIS — Z124 Encounter for screening for malignant neoplasm of cervix: Secondary | ICD-10-CM

## 2023-07-13 DIAGNOSIS — E559 Vitamin D deficiency, unspecified: Secondary | ICD-10-CM | POA: Diagnosis not present

## 2023-07-13 DIAGNOSIS — Z79899 Other long term (current) drug therapy: Secondary | ICD-10-CM

## 2023-07-13 DIAGNOSIS — I10 Essential (primary) hypertension: Secondary | ICD-10-CM | POA: Diagnosis not present

## 2023-07-13 DIAGNOSIS — R7309 Other abnormal glucose: Secondary | ICD-10-CM

## 2023-07-13 DIAGNOSIS — Q249 Congenital malformation of heart, unspecified: Secondary | ICD-10-CM

## 2023-07-13 DIAGNOSIS — Z6831 Body mass index (BMI) 31.0-31.9, adult: Secondary | ICD-10-CM

## 2023-07-13 DIAGNOSIS — E782 Mixed hyperlipidemia: Secondary | ICD-10-CM

## 2023-07-13 LAB — POCT URINALYSIS DIP (CLINITEK)
Bilirubin, UA: NEGATIVE
Glucose, UA: NEGATIVE mg/dL
Ketones, POC UA: NEGATIVE mg/dL
Leukocytes, UA: NEGATIVE
Nitrite, UA: NEGATIVE
POC PROTEIN,UA: NEGATIVE
Spec Grav, UA: 1.015 (ref 1.010–1.025)
Urobilinogen, UA: 0.2 U/dL
pH, UA: 6 (ref 5.0–8.0)

## 2023-07-13 NOTE — Progress Notes (Unsigned)
 [place], CMA,acting as a Neurosurgeon for Stephanie Epley, FNP.,have documented all relevant documentation on the behalf of Stephanie Epley, FNP,as directed by  Stephanie Epley, FNP while in the presence of Stephanie Epley, FNP.  Subjective:    Patient ID: Stephanie Garrison , female    DOB: 1965/04/01 , 58 y.o.   MRN: [id number]  Chief Complaint  Patient presents with   Annual Exam    Patient presents today for HM, Patient reports compliance with medication. Patient denies any chest pain, SOB, or headaches. Patient has no concerns today.     HPI  Here for HM. She is to see Dr. Antonetta Kitchen tomorrow - urologist  She is having hair thinning, she is drinking under 64 oz water a day.      Past Medical History:  Diagnosis Date   Arthritis    Diabetes mellitus without complication (HCC)    GERD (gastroesophageal reflux disease)    OTC   Heart murmur    Hypertension    Hypothyroidism    Pre-diabetes    RBBB (right bundle branch block)    recurrent, asymptomatic   S/P CABG (coronary artery bypass graft) 1964   when she was 58 years old   S/P repair of tetralogy of Fallot    2D ECHO, 10/10/2009 - EF >55%, normal to mild   Thyroid disease    hypothyroidism     Family History  Problem Relation Age of Onset   Hyperlipidemia Mother    Breast cancer Cousin 18       maternal 1st cousin     Current Outpatient Medications:    acetaminophen  (TYLENOL ) 500 MG tablet, Take 1,000 mg by mouth every 6 (six) hours as needed for moderate pain or mild pain., Disp: , Rfl:    amLODipine  (NORVASC ) 5 MG tablet, TAKE 1 TABLET DAILY, Disp: 90 tablet, Rfl: 3   bisacodyl (DULCOLAX) 5 MG EC tablet, Take 5 mg by mouth daily as needed for moderate constipation. Exlax, Disp: , Rfl:    famotidine  (PEPCID ) 20 MG tablet, Take 20 mg by mouth as needed for heartburn or indigestion., Disp: , Rfl:    levothyroxine  (SYNTHROID ) 50 MCG tablet, TAKE 1 TABLET DAILY, Disp: 90 tablet, Rfl: 3   metFORMIN  (GLUCOPHAGE ) 500 MG tablet,  TAKE 1 TABLET BY MOUTH TWICE A DAY WITH FOOD, Disp: 90 tablet, Rfl: 2   olmesartan -hydrochlorothiazide (BENICAR  HCT) 40-25 MG tablet, Take 1 tablet by mouth daily., Disp: 90 tablet, Rfl: 1   pravastatin  (PRAVACHOL ) 80 MG tablet, TAKE 1 TABLET DAILY, Disp: 90 tablet, Rfl: 3   Vitamin D , Ergocalciferol , (DRISDOL ) 1.25 MG (50000 UNIT) CAPS capsule, Take 1 capsule (50,000 Units total) by mouth every Sunday., Disp: 12 capsule, Rfl: 3   No Known Allergies    The patient states she uses post menopausal status for birth control. No LMP recorded (lmp unknown). Patient is postmenopausal.  Negative for: breast discharge, breast lump(s), breast pain and breast self exam. Associated symptoms include abnormal vaginal bleeding. Pertinent negatives include abnormal bleeding (hematology), anxiety, decreased libido, depression, difficulty falling sleep, dyspareunia, history of infertility, nocturia, sexual dysfunction, sleep disturbances, urinary incontinence, urinary urgency, vaginal discharge and vaginal itching. Diet regular; she feels her diet is better. Her scale at home was 167lbs, she has cut down on sodas and limiting foods that are white. The patient states her exercise level is minimal - she does a little walking. She was doing Zumba but was having difficulty.   The patient's tobacco use is:  Social History   Tobacco Use  Smoking Status Never  Smokeless Tobacco Never   She has been exposed to passive smoke. The patient's alcohol use is:  Social History   Substance and Sexual Activity  Alcohol Use Yes   Alcohol/week: 6.0 standard drinks of alcohol   Types: 6 Cans of beer per week   Comment: 3 drinks tonight or more    Additional information: Last pap 04/10/2020, next one scheduled for 07/13/2023.    Review of Systems  Constitutional: Negative.   HENT: Negative.    Eyes: Negative.   Respiratory: Negative.  Negative for shortness of breath.   Cardiovascular: Negative.  Negative for chest pain,  palpitations and leg swelling.  Gastrointestinal: Negative.   Neurological:  Negative for headaches.  Psychiatric/Behavioral: Negative.       Today's Vitals   07/13/23 1412  BP: 120/70  Pulse: 71  Temp: 98.6 F (37 C)  TempSrc: Oral  Weight: 171 lb 6.4 oz (77.7 kg)  Height: 5\' 2"  (1.575 m)  PainSc: 0-No pain   Body mass index is 31.35 kg/m.  Wt Readings from Last 3 Encounters:  07/13/23 171 lb 6.4 oz (77.7 kg)  02/23/23 169 lb (76.7 kg)  12/28/22 174 lb (78.9 kg)     Objective:  Physical Exam Vitals and nursing note reviewed. Exam conducted with a chaperone present.  Constitutional:      General: She is not in acute distress.    Appearance: Normal appearance. She is well-developed. She is obese.  HENT:     Head: Normocephalic and atraumatic.     Right Ear: Hearing, tympanic membrane, ear canal and external ear normal. There is no impacted cerumen.     Left Ear: Hearing, tympanic membrane, ear canal and external ear normal. There is no impacted cerumen.     Nose: Nose normal.  Eyes:     General: Lids are normal.     Extraocular Movements: Extraocular movements intact.     Conjunctiva/sclera: Conjunctivae normal.     Pupils: Pupils are equal, round, and reactive to light.     Funduscopic exam:    Right eye: No papilledema.        Left eye: No papilledema.  Neck:     Thyroid: No thyroid mass.     Vascular: No carotid bruit.  Cardiovascular:     Rate and Rhythm: Normal rate and regular rhythm.     Pulses: Normal pulses.     Heart sounds: Murmur heard.  Pulmonary:     Effort: Pulmonary effort is normal. No respiratory distress.     Breath sounds: Normal breath sounds. No wheezing.  Chest:     Chest wall: No mass.  Breasts:    Tanner Score is 5.     Right: Normal. No mass or tenderness.     Left: Normal. No mass or tenderness.  Abdominal:     General: Abdomen is flat. Bowel sounds are normal. There is no distension.     Palpations: Abdomen is soft.      Tenderness: There is no abdominal tenderness.  Genitourinary:    General: Normal vulva.     Tanner stage (genital): 5.     Labia:        Right: No rash.        Left: No rash.      Urethra: No prolapse.     Vagina: Normal.     Cervix: Normal.     Uterus: Normal.      Adnexa:  Right adnexa normal and left adnexa normal.     Rectum: Normal.  Musculoskeletal:        General: No swelling or tenderness. Normal range of motion.     Cervical back: Full passive range of motion without pain, normal range of motion and neck supple.     Right lower leg: No edema.     Left lower leg: No edema.  Lymphadenopathy:     Upper Body:     Right upper body: No supraclavicular, axillary or pectoral adenopathy.     Left upper body: No supraclavicular, axillary or pectoral adenopathy.  Skin:    General: Skin is warm and dry.     Capillary Refill: Capillary refill takes less than 2 seconds.     Comments: surgical scar to chest  Neurological:     General: No focal deficit present.     Mental Status: She is alert and oriented to person, place, and time.     Cranial Nerves: No cranial nerve deficit.     Sensory: No sensory deficit.     Motor: No weakness.  Psychiatric:        Mood and Affect: Mood normal.        Behavior: Behavior normal.        Thought Content: Thought content normal.        Judgment: Judgment normal.         Assessment And Plan:     Encounter for annual health examination Assessment & Plan: Behavior modifications discussed and diet history reviewed.   Pt will continue to exercise regularly and modify diet with low GI, plant based foods and decrease intake of processed foods.  Recommend intake of daily multivitamin, Vitamin D , and calcium.  Recommend mammogram and colonoscopy for preventive screenings, as well as recommend immunizations that include influenza, TDAP, and Shingles    Essential hypertension Assessment & Plan: Blood pressure is controlled. Continue current  medications. EKG done with no change from previous  Orders: -     EKG 12-Lead -     POCT URINALYSIS DIP (CLINITEK) -     Microalbumin / creatinine urine ratio -     CMP14+EGFR  Mixed hyperlipidemia Assessment & Plan: Cholesterol levels are stable. Encouraged to limit intake of high fat foods.   Orders: -     Lipid panel  Abnormal glucose Assessment & Plan: HgbA1c is stable. Will check HgbA1c   Orders: -     Hemoglobin A1c  Vitamin D  deficiency Assessment & Plan: Will check vitamin D  level and supplement as needed.    Also encouraged to spend 15 minutes in the sun daily.    Orders: -     VITAMIN D  25 Hydroxy (Vit-D Deficiency, Fractures)  Acquired hypothyroidism Assessment & Plan: Thyroid levels are normal. Continue current medications. Will check thyroid levels  Orders: -     TSH + free T4  Congenital heart disease Assessment & Plan: Continue f/u with Cardiology   Encounter for Papanicolaou smear of cervix -     Cytology - PAP  Herpes zoster vaccination declined Assessment & Plan: Declines shingrix, educated on disease process and is aware if he changes his mind to notify office    BMI 31.0-31.9,adult Assessment & Plan: She is encouraged to strive for BMI less than 30 to decrease cardiac risk. Advised to aim for at least 150 minutes of exercise per week. Weight has been stable.      Other long term (current) drug therapy -  CBC with Differential/Platelet     Return for 1 year physical, 6 month bp check. Patient was given opportunity to ask questions. Patient verbalized understanding of the plan and was able to repeat key elements of the plan. All questions were answered to their satisfaction.   Stephanie Epley, FNP  I, Stephanie Epley, FNP, have reviewed all documentation for this visit. The documentation on 07/13/23 for the exam, diagnosis, procedures, and orders are all accurate and complete.

## 2023-07-14 LAB — TSH+FREE T4
Free T4: 1.56 ng/dL (ref 0.82–1.77)
TSH: 3.49 u[IU]/mL (ref 0.450–4.500)

## 2023-07-14 LAB — LIPID PANEL
Chol/HDL Ratio: 2.9 ratio (ref 0.0–4.4)
Cholesterol, Total: 191 mg/dL (ref 100–199)
HDL: 66 mg/dL (ref >39)
LDL Chol Calc (NIH): 97 mg/dL (ref 0–99)
Triglycerides: 161 mg/dL — ABNORMAL HIGH (ref 0–149)
VLDL Cholesterol Cal: 28 mg/dL (ref 5–40)

## 2023-07-14 LAB — CMP14+EGFR
ALT: 15 IU/L (ref 0–32)
AST: 21 IU/L (ref 0–40)
Albumin: 4.1 g/dL (ref 3.8–4.9)
Alkaline Phosphatase: 57 IU/L (ref 44–121)
BUN/Creatinine Ratio: 28 — ABNORMAL HIGH (ref 9–23)
BUN: 21 mg/dL (ref 6–24)
Bilirubin Total: 0.4 mg/dL (ref 0.0–1.2)
CO2: 25 mmol/L (ref 20–29)
Calcium: 9.6 mg/dL (ref 8.7–10.2)
Chloride: 101 mmol/L (ref 96–106)
Creatinine, Ser: 0.75 mg/dL (ref 0.57–1.00)
Globulin, Total: 2.6 g/dL (ref 1.5–4.5)
Glucose: 90 mg/dL (ref 70–99)
Potassium: 3.8 mmol/L (ref 3.5–5.2)
Sodium: 140 mmol/L (ref 134–144)
Total Protein: 6.7 g/dL (ref 6.0–8.5)
eGFR: 93 mL/min/{1.73_m2} (ref >59)

## 2023-07-14 LAB — CBC WITH DIFFERENTIAL/PLATELET
Basophils Absolute: 0 10*3/uL (ref 0.0–0.2)
Basos: 1 %
EOS (ABSOLUTE): 0.2 10*3/uL (ref 0.0–0.4)
Eos: 2 %
Hematocrit: 38.5 % (ref 34.0–46.6)
Hemoglobin: 12.3 g/dL (ref 11.1–15.9)
Immature Grans (Abs): 0 10*3/uL (ref 0.0–0.1)
Immature Granulocytes: 0 %
Lymphocytes Absolute: 3.2 10*3/uL — ABNORMAL HIGH (ref 0.7–3.1)
Lymphs: 46 %
MCH: 30.6 pg (ref 26.6–33.0)
MCHC: 31.9 g/dL (ref 31.5–35.7)
MCV: 96 fL (ref 79–97)
Monocytes Absolute: 0.6 10*3/uL (ref 0.1–0.9)
Monocytes: 9 %
Neutrophils Absolute: 2.9 10*3/uL (ref 1.4–7.0)
Neutrophils: 42 %
Platelets: 278 10*3/uL (ref 150–450)
RBC: 4.02 x10E6/uL (ref 3.77–5.28)
RDW: 12.6 % (ref 11.7–15.4)
WBC: 7 10*3/uL (ref 3.4–10.8)

## 2023-07-14 LAB — MICROALBUMIN / CREATININE URINE RATIO
Creatinine, Urine: 78.1 mg/dL
Microalb/Creat Ratio: 11 mg/g{creat} (ref 0–29)
Microalbumin, Urine: 8.4 ug/mL

## 2023-07-14 LAB — HEMOGLOBIN A1C
Est. average glucose Bld gHb Est-mCnc: 126 mg/dL
Hgb A1c MFr Bld: 6 % — ABNORMAL HIGH (ref 4.8–5.6)

## 2023-07-14 LAB — VITAMIN D 25 HYDROXY (VIT D DEFICIENCY, FRACTURES): Vit D, 25-Hydroxy: 80.5 ng/mL (ref 30.0–100.0)

## 2023-07-15 LAB — CYTOLOGY - PAP
Comment: NEGATIVE
Diagnosis: NEGATIVE
High risk HPV: NEGATIVE

## 2023-07-15 LAB — LAB REPORT - SCANNED: EGFR: 94.9

## 2023-07-22 ENCOUNTER — Ambulatory Visit: Payer: Self-pay | Admitting: Nurse Practitioner

## 2023-07-22 DIAGNOSIS — Z0000 Encounter for general adult medical examination without abnormal findings: Secondary | ICD-10-CM | POA: Insufficient documentation

## 2023-07-22 NOTE — Assessment & Plan Note (Signed)
 Declines shingrix, educated on disease process and is aware if he changes his mind to notify office

## 2023-07-22 NOTE — Assessment & Plan Note (Signed)

## 2023-07-22 NOTE — Assessment & Plan Note (Signed)
 Will check vitamin D level and supplement as needed.    Also encouraged to spend 15 minutes in the sun daily.

## 2023-07-22 NOTE — Assessment & Plan Note (Signed)
Continue f/u with Cardiology.

## 2023-07-22 NOTE — Assessment & Plan Note (Signed)
Thyroid levels are normal. Continue current medications. Will check thyroid levels

## 2023-07-22 NOTE — Assessment & Plan Note (Signed)
 She is encouraged to strive for BMI less than 30 to decrease cardiac risk. Advised to aim for at least 150 minutes of exercise per week. Weight has been stable.

## 2023-07-22 NOTE — Assessment & Plan Note (Signed)
Cholesterol levels are stable. Encouraged to limit intake of high fat foods.

## 2023-07-22 NOTE — Assessment & Plan Note (Addendum)
 Blood pressure is controlled. Continue current medications. EKG done with no change from previous

## 2023-07-22 NOTE — Assessment & Plan Note (Signed)
 HgbA1c is stable. Will check HgbA1c

## 2023-08-03 ENCOUNTER — Other Ambulatory Visit: Payer: Self-pay | Admitting: Nurse Practitioner

## 2023-08-03 DIAGNOSIS — Z1231 Encounter for screening mammogram for malignant neoplasm of breast: Secondary | ICD-10-CM

## 2023-09-03 ENCOUNTER — Ambulatory Visit
Admission: RE | Admit: 2023-09-03 | Discharge: 2023-09-03 | Disposition: A | Discharge status: Home / Self Care | Source: Ambulatory Visit | Attending: Nurse Practitioner | Admitting: Nurse Practitioner

## 2023-09-03 DIAGNOSIS — Z1231 Encounter for screening mammogram for malignant neoplasm of breast: Secondary | ICD-10-CM

## 2023-09-10 ENCOUNTER — Telehealth: Payer: Self-pay | Admitting: Cardiovascular Disease

## 2023-09-10 NOTE — Telephone Encounter (Signed)
 Pt c/o medication issue:  1. Name of Medication: Amoxicillin   2. How are you currently taking this medication (dosage and times per day)? As Written  3. Are you having a reaction (difficulty breathing--STAT)? No  4. What is your medication issue? Patient would like to confirm that she is supposed to stop taking this medication. Please advise.

## 2023-09-10 NOTE — Telephone Encounter (Signed)
 Patient states her dentist told her she no longer needs to take amoxicillin  prophylactic prior to dental appointments.  Patient wanted to check with Dr. Court about this to see if he agreed. Informed patient Dr. Court is out of the office until July 28th. Informed patient I will also send to Dr. Ranee nurse who may be able to respond to her sooner. Patient states she will wait to hear back from Dr. Court.

## 2023-09-11 ENCOUNTER — Other Ambulatory Visit: Payer: Self-pay | Admitting: Nurse Practitioner

## 2023-09-11 DIAGNOSIS — R7309 Other abnormal glucose: Secondary | ICD-10-CM

## 2023-09-15 NOTE — Telephone Encounter (Signed)
 Patient returned RN's call.

## 2023-09-15 NOTE — Telephone Encounter (Signed)
 Pt returning call, her dental cleaning is tomorrow

## 2023-09-15 NOTE — Telephone Encounter (Addendum)
 Call was transferred but no one was on the other end of the call. Attempted call back but call went to voicemail.

## 2023-09-15 NOTE — Telephone Encounter (Signed)
 Spoke with pt and advised per Dr Court he agrees pt will no longer need SBE prophylaxis.  Pt verbalizes understanding and thanked Charity fundraiser.

## 2023-09-15 NOTE — Telephone Encounter (Signed)
 Attempted phone call to pt.  OK to leave detailed VM per Epic.  Left voicemail message and advised of Dr Ranee message below.  Pt advised to contact [phone] for any further questions.

## 2023-09-27 ENCOUNTER — Telehealth: Payer: Self-pay | Admitting: Pharmacist

## 2023-09-27 DIAGNOSIS — R7309 Other abnormal glucose: Secondary | ICD-10-CM

## 2023-09-27 MED ORDER — METFORMIN HCL 500 MG PO TABS
500.0000 mg | ORAL_TABLET | Freq: Two times a day (BID) | ORAL | 3 refills | Status: AC
Start: 2023-09-27 — End: ?

## 2023-09-27 NOTE — Progress Notes (Signed)
   09/27/2023  Patient ID: Stephanie Garrison, female   DOB: [birth year], 58 y.o.   MRN: [id number]  While on the phone with the Patient's Husband about his medications, the Patient got on the phone and wondered if I could help her with her metformin .  HIPAA identifiers were obtained. For some reason, she was under the impression that [facility] did not carry metformin .  While I was on the phone with [facility] to discuss [name], I inquired about [name] as well. Just like [name], all of her chronic medications can only be filled three times at a retail pharmacy before the price increases.    Patient asked for a refill of her metformin  to be sent to [facility].   Reviewed Patient's medications:  Medications Reviewed Today     Reviewed by Jolee Cassius PARAS, [facility] (Pharmacist) on 09/27/23 at 1220  Med List Status: <None>   Medication Order Taking? Sig Documenting Provider Last Dose Status Informant  acetaminophen  (TYLENOL ) 500 MG tablet [id number] Yes Take 1,000 mg by mouth every 6 (six) hours as needed for moderate pain or mild pain. [provider]  Active Self  amLODipine  (NORVASC ) 5 MG tablet [id number] Yes TAKE 1 TABLET DAILY Moore, Janece, FNP  Active   bisacodyl (DULCOLAX) 5 MG EC tablet [id number] Yes Take 5 mg by mouth daily as needed for moderate constipation. Exlax [provider]  Active   famotidine  (PEPCID ) 20 MG tablet [id number] Yes Take 20 mg by mouth as needed for heartburn or indigestion. [provider]  Active   levothyroxine  (SYNTHROID ) 50 MCG tablet [id number] Yes TAKE 1 TABLET DAILY Georgina Speaks, FNP  Active   metFORMIN  (GLUCOPHAGE ) 500 MG tablet [id number] Yes TAKE 1 TABLET BY MOUTH TWICE A DAY WITH FOOD Georgina Speaks, FNP  Active   olmesartan -hydrochlorothiazide (BENICAR  HCT) 40-25 MG tablet [id number] Yes Take 1 tablet by mouth daily. Georgina Speaks, FNP  Active   pravastatin  (PRAVACHOL ) 80 MG tablet  [id number] Yes TAKE 1 TABLET DAILY Georgina Speaks, FNP  Active   Vitamin D , Ergocalciferol , (DRISDOL ) 1.25 MG (50000 UNIT) CAPS capsule [id number] Yes Take 1 capsule (50,000 Units total) by mouth every Sunday. Georgina Speaks, FNP  Active              A1c- 6%  Lipids- LDL-97 TG 161-On Pravastatin  80 mg 1 tablet daily --may consider a more potent statin-upcoming Cardiology visit on 12/27/23 and upcoming visit with provider 01/11/24   Lipid Panel     Component Value Date/Time   CHOL 191 07/13/2023 1455   TRIG 161 (H) 07/13/2023 1455   HDL 66 07/13/2023 1455   CHOLHDL 2.9 07/13/2023 1455   LDLCALC 97 07/13/2023 1455   LABVLDL 28 07/13/2023 1455     Plan: Send note to Provider prior to upcoming appointment on 01/11/24 about intensifying statin therapy.    Cassius DOROTHA Jolee, PharmD, BCACP Clinical Pharmacist [phone]

## 2023-10-04 ENCOUNTER — Other Ambulatory Visit: Payer: Self-pay | Admitting: Nurse Practitioner

## 2023-10-28 ENCOUNTER — Other Ambulatory Visit: Payer: Self-pay | Admitting: Nurse Practitioner

## 2023-10-28 DIAGNOSIS — R350 Frequency of micturition: Secondary | ICD-10-CM

## 2023-12-27 ENCOUNTER — Ambulatory Visit: Admitting: Cardiovascular Disease

## 2024-01-10 ENCOUNTER — Encounter: Payer: Self-pay | Admitting: Cardiovascular Disease

## 2024-01-10 ENCOUNTER — Telehealth: Payer: Self-pay | Admitting: Emergency Medicine

## 2024-01-10 ENCOUNTER — Ambulatory Visit: Attending: Cardiovascular Disease | Admitting: Cardiovascular Disease

## 2024-01-10 VITALS — BP 130/90 | HR 73 | Ht 62.0 in | Wt 172.0 lb

## 2024-01-10 DIAGNOSIS — I4510 Unspecified right bundle-branch block: Secondary | ICD-10-CM | POA: Diagnosis not present

## 2024-01-10 DIAGNOSIS — I10 Essential (primary) hypertension: Secondary | ICD-10-CM

## 2024-01-10 DIAGNOSIS — E782 Mixed hyperlipidemia: Secondary | ICD-10-CM

## 2024-01-10 DIAGNOSIS — Q249 Congenital malformation of heart, unspecified: Secondary | ICD-10-CM

## 2024-01-10 NOTE — Telephone Encounter (Signed)
 Paper Work Dropped Off: CT paperwork   Date:01/10/24  Location of paper:  Dr. Court Mailbox

## 2024-01-10 NOTE — Assessment & Plan Note (Signed)
 History of essential hypertension blood pressure measured today 130/90.  She is on amlodipine , Benicar /hydrochlorothiazide.

## 2024-01-10 NOTE — Patient Instructions (Signed)

## 2024-01-10 NOTE — Assessment & Plan Note (Signed)
 History of hyperlipidemia on pravastatin  with lipid profile performed 07/13/2023 revealing total cholesterol 191, LDL of 97 and HDL 66.

## 2024-01-10 NOTE — Progress Notes (Signed)
 01/10/2024 Stephanie Garrison   10/28/1965  [id number]  Primary Physician Georgina Speaks, FNP Primary Cardiologist: Dorn JINNY Lesches MD [facility], [place], [place]  HPI:  Stephanie Garrison is a 58 y.o.   mildly overweight married African American female, mother of 1 child, who I last saw 12/28/22. She  has a history of repaired tetralogy of Fallot back in the 62s. Her other problems include hypertension, chronic right bundle branch block. She has been asymptomatic since I saw her last except for 1 episode of what sounds like PAF as a result of excessive alcohol intake.    She was complaining of some shortness of breath and palpitations however these symptoms have resolved.  An event monitor showed occasional PACs, PVCs and short runs of SVT but she no longer complains of palpitations.  2D echo performed 01/09/2021 was essentially normal without evidence of residual VSD, normal LV systolic function and normal valvular function.   Since I saw her a year ago she is remained stable.  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  This was a successful procedure.  She otherwise denies chest pain or shortness of breath.   Current Meds  Medication Sig   acetaminophen  (TYLENOL ) 500 MG tablet Take 1,000 mg by mouth every 6 (six) hours as needed for moderate pain or mild pain.   amLODipine  (NORVASC ) 5 MG tablet TAKE 1 TABLET DAILY   bisacodyl (DULCOLAX) 5 MG EC tablet Take 5 mg by mouth daily as needed for moderate constipation. Exlax   famotidine  (PEPCID ) 20 MG tablet Take 20 mg by mouth as needed for heartburn or indigestion.   levothyroxine  (SYNTHROID ) 50 MCG tablet TAKE 1 TABLET DAILY   metFORMIN  (GLUCOPHAGE ) 500 MG tablet Take 1 tablet (500 mg total) by mouth 2 (two) times daily with a meal.   olmesartan -hydrochlorothiazide (BENICAR  HCT) 40-25 MG tablet TAKE 1 TABLET DAILY   pravastatin  (PRAVACHOL ) 80 MG tablet TAKE 1 TABLET DAILY   Vitamin D ,  Ergocalciferol , (DRISDOL ) 1.25 MG (50000 UNIT) CAPS capsule TAKE 1 CAPSULE EVERY SUNDAY     No Known Allergies  Social History   Socioeconomic History   Marital status: Married    Spouse name: Not on file   Number of children: Not on file   Years of education: Not on file   Highest education level: Not on file  Occupational History   Not on file  Tobacco Use   Smoking status: Never   Smokeless tobacco: Never  Vaping Use   Vaping status: Never Used  Substance and Sexual Activity   Alcohol use: Yes    Alcohol/week: 6.0 standard drinks of alcohol    Types: 6 Cans of beer per week    Comment: 3 drinks tonight or more    Drug use: No   Sexual activity: Yes  Other Topics Concern   Not on file  Social History Narrative   Not on file   Social Drivers of Health   Financial Resource Strain: Not on file  Food Insecurity: No Food Insecurity (03/13/2022)   Hunger Vital Sign    Worried About Running Out of Food in the Last Year: Never true    Ran Out of Food in the Last Year: Never true  Transportation Needs: No Transportation Needs (03/13/2022)   PRAPARE - Administrator, Civil Service (Medical): No    Lack of Transportation (Non-Medical): No  Physical Activity: Insufficiently Active (  06/11/2021)   Exercise Vital Sign    Days of Exercise per Week: 2 days    Minutes of Exercise per Session: 20 min  Stress: Not on file  Social Connections: Not on file  Intimate Partner Violence: Not At Risk (03/13/2022)   Humiliation, Afraid, Rape, and Kick questionnaire    Fear of Current or Ex-Partner: No    Emotionally Abused: No    Physically Abused: No    Sexually Abused: No     Review of Systems: General: negative for chills, fever, night sweats or weight changes.  Cardiovascular: negative for chest pain, dyspnea on exertion, edema, orthopnea, palpitations, paroxysmal nocturnal dyspnea or shortness of breath Dermatological: negative for rash Respiratory: negative for cough  or wheezing Urologic: negative for hematuria Abdominal: negative for nausea, vomiting, diarrhea, bright red blood per rectum, melena, or hematemesis Neurologic: negative for visual changes, syncope, or dizziness All other systems reviewed and are otherwise negative except as noted above.    Blood pressure (!) 130/90, pulse 73, height 5' 2 (1.575 m), weight 172 lb (78 kg), SpO2 97%.  General appearance: alert and no distress Neck: no adenopathy, no carotid bruit, no JVD, supple, symmetrical, trachea midline, and thyroid not enlarged, symmetric, no tenderness/mass/nodules Lungs: clear to auscultation bilaterally Heart: regular rate and rhythm, S1, S2 normal, no murmur, click, rub or gallop Extremities: extremities normal, atraumatic, no cyanosis or edema Pulses: 2+ and symmetric Skin: Skin color, texture, turgor normal. No rashes or lesions Neurologic: Grossly normal  EKG EKG Interpretation Date/Time:  Monday January 10 2024 08:17:58 EST Ventricular Rate:  73 PR Interval:  142 QRS Duration:  136 QT Interval:  470 QTC Calculation: 517 R Axis:   5  Text Interpretation: Normal sinus rhythm Right bundle branch block When compared with ECG of 28-Dec-2022 13:50, No significant change was found Confirmed by Court Carrier [phone]) on 01/10/2024 8:21:22 AM    ASSESSMENT AND PLAN:   Right bundle branch block Chronic  Essential hypertension History of essential hypertension blood pressure measured today 130/90.  She is on amlodipine , Benicar /hydrochlorothiazide.  Congenital heart disease History of congenital heart disease status post tetralogy of Fallot with repair back in the 1970s with normal 2D echo 01/09/2021.  Mixed hyperlipidemia History of hyperlipidemia on pravastatin  with lipid profile performed 07/13/2023 revealing total cholesterol 191, LDL of 97 and HDL 66.     Carrier DOROTHA Court MD [facility] 01/10/2024 8:27 AM

## 2024-01-10 NOTE — Assessment & Plan Note (Signed)
 History of congenital heart disease status post tetralogy of Fallot with repair back in the 1970s with normal 2D echo 01/09/2021.

## 2024-01-10 NOTE — Assessment & Plan Note (Signed)
 Chronic

## 2024-01-11 ENCOUNTER — Encounter: Payer: Self-pay | Admitting: Nurse Practitioner

## 2024-01-11 ENCOUNTER — Ambulatory Visit: Payer: Self-pay | Admitting: Nurse Practitioner

## 2024-01-11 VITALS — BP 126/78 | HR 73 | Temp 98.6°F | Ht 62.0 in | Wt 170.1 lb

## 2024-01-11 DIAGNOSIS — E039 Hypothyroidism, unspecified: Secondary | ICD-10-CM | POA: Diagnosis not present

## 2024-01-11 DIAGNOSIS — Z139 Encounter for screening, unspecified: Secondary | ICD-10-CM

## 2024-01-11 DIAGNOSIS — E782 Mixed hyperlipidemia: Secondary | ICD-10-CM | POA: Diagnosis not present

## 2024-01-11 DIAGNOSIS — R7309 Other abnormal glucose: Secondary | ICD-10-CM | POA: Diagnosis not present

## 2024-01-11 DIAGNOSIS — Z2821 Immunization not carried out because of patient refusal: Secondary | ICD-10-CM | POA: Diagnosis not present

## 2024-01-11 DIAGNOSIS — I10 Essential (primary) hypertension: Secondary | ICD-10-CM

## 2024-01-11 DIAGNOSIS — H9201 Otalgia, right ear: Secondary | ICD-10-CM

## 2024-01-11 DIAGNOSIS — Z6831 Body mass index (BMI) 31.0-31.9, adult: Secondary | ICD-10-CM

## 2024-01-11 DIAGNOSIS — L608 Other nail disorders: Secondary | ICD-10-CM

## 2024-01-11 NOTE — Progress Notes (Unsigned)
 Stephanie Garrison, CMA,acting as a neurosurgeon for [name], FNP.,have documented all relevant documentation on the behalf of [name], FNP,as directed by  [name], FNP while in the presence of [name], FNP.  Subjective:  Patient ID: Stephanie Garrison , female    DOB: 06/02/1965 , 58 y.o.   MRN: [id number]  Chief Complaint  Patient presents with   Hypertension    Patient presents today for a bp and  pre dm follow up, Patient reports compliance with medication. Patient denies any chest pain, SOB, or headaches.    Nail Problem    Patient reports her baby toenail on her right toe looks like it shifted. Patient denies pain.    Otalgia    Patient also has right ear pain, she reports it is tender. She reports she did use ear drops last week to help with the pain.     She seen her Cardiologist.   Hypertension This is a chronic problem. The current episode started more than 1 year ago. The problem is unchanged. The problem is controlled. Pertinent negatives include no chest pain, headaches, palpitations or shortness of breath. There are no associated agents to hypertension. Risk factors for coronary artery disease include obesity. Past treatments include diuretics and angiotensin blockers. The current treatment provides moderate improvement. Compliance problems include exercise and diet.  There is no history of angina or kidney disease. There is no history of chronic renal disease.    Discussed the use of AI scribe software for clinical note transcription with the patient, who gave verbal consent to proceed.  History of Present Illness Stephanie Garrison is a 58 year old female who presents for a follow-up visit after seeing a cardiologist and nephrologist.  She recently saw a cardiologist and previously visited her nephrologist, Dr. Carolynn, in June. During the nephrology visit, she was informed of a possible tumor or cyst on her heart, though she is uncertain of the exact nature. There  is confusion regarding the communication of these findings to her current healthcare provider, as she left the form in her car and later gave it to the receptionist.  She experiences ear pain, which is less severe than before, but finds it difficult to insert earplugs due to tenderness. She has used ear drops previously, which provided some relief, but she still experiences discomfort. No use of anything other than Q-tips in her ears.  She describes a nail problem, noting a shift in the position of her toenail, which she attributes to wearing steel-toed shoes. The nail does not cause pain, but she has noticed a change in its appearance, describing it as a 'stack nail'.  She denies needing any medication refills at this time.  Past Medical History:  Diagnosis Date   Arthritis    Diabetes mellitus without complication (HCC)    GERD (gastroesophageal reflux disease)    OTC   Heart murmur    Hypertension    Hypothyroidism    Pre-diabetes    RBBB (right bundle branch block)    recurrent, asymptomatic   S/P CABG (coronary artery bypass graft) 1964   when she was 58 years old   S/P repair of tetralogy of Fallot    2D ECHO, 10/10/2009 - EF >55%, normal to mild   Thyroid disease    hypothyroidism     Family History  Problem Relation Age of Onset   Hyperlipidemia Mother    Breast cancer Cousin 67       maternal 1st  cousin     Current Outpatient Medications:    acetaminophen  (TYLENOL ) 500 MG tablet, Take 1,000 mg by mouth every 6 (six) hours as needed for moderate pain or mild pain., Disp: , Rfl:    amLODipine  (NORVASC ) 5 MG tablet, TAKE 1 TABLET DAILY, Disp: 90 tablet, Rfl: 3   bisacodyl (DULCOLAX) 5 MG EC tablet, Take 5 mg by mouth daily as needed for moderate constipation. Exlax, Disp: , Rfl:    famotidine  (PEPCID ) 20 MG tablet, Take 20 mg by mouth as needed for heartburn or indigestion., Disp: , Rfl:    levothyroxine  (SYNTHROID ) 50 MCG tablet, TAKE 1 TABLET DAILY, Disp: 90 tablet,  Rfl: 3   metFORMIN  (GLUCOPHAGE ) 500 MG tablet, Take 1 tablet (500 mg total) by mouth 2 (two) times daily with a meal., Disp: 180 tablet, Rfl: 3   olmesartan -hydrochlorothiazide (BENICAR  HCT) 40-25 MG tablet, TAKE 1 TABLET DAILY, Disp: 90 tablet, Rfl: 3   pravastatin  (PRAVACHOL ) 80 MG tablet, TAKE 1 TABLET DAILY, Disp: 90 tablet, Rfl: 3   Vitamin D , Ergocalciferol , (DRISDOL ) 1.25 MG (50000 UNIT) CAPS capsule, TAKE 1 CAPSULE EVERY SUNDAY, Disp: 12 capsule, Rfl: 3   No Known Allergies   Review of Systems  Constitutional: Negative.   Respiratory:  Negative for shortness of breath.   Cardiovascular:  Negative for chest pain and palpitations.  Skin: Negative.   Neurological:  Negative for dizziness and headaches.  Psychiatric/Behavioral: Negative.       Today's Vitals   01/11/24 1422  BP: 126/78  Pulse: 73  Temp: 98.6 F (37 C)  TempSrc: Oral  Weight: 170 lb 1.6 oz (77.2 kg)  Height: 5' 2 (1.575 m)  PainSc: 0-No pain   Body mass index is 31.11 kg/m.  Wt Readings from Last 3 Encounters:  01/11/24 170 lb 1.6 oz (77.2 kg)  01/10/24 172 lb (78 kg)  07/13/23 171 lb 6.4 oz (77.7 kg)      Objective:  Physical Exam Vitals and nursing note reviewed.  Constitutional:      General: She is not in acute distress.    Appearance: Normal appearance. She is obese.  Cardiovascular:     Rate and Rhythm: Normal rate and regular rhythm.     Pulses: Normal pulses.     Heart sounds: Normal heart sounds. No murmur heard. Pulmonary:     Effort: Pulmonary effort is normal. No respiratory distress.     Breath sounds: Normal breath sounds. No wheezing.  Skin:    General: Skin is warm and dry.     Capillary Refill: Capillary refill takes less than 2 seconds.  Neurological:     General: No focal deficit present.     Mental Status: She is alert and oriented to person, place, and time.     Cranial Nerves: No cranial nerve deficit.     Motor: No weakness.  Psychiatric:        Mood and Affect:  Mood normal.        Behavior: Behavior normal.        Thought Content: Thought content normal.        Judgment: Judgment normal.      Assessment And Plan:   Assessment & Plan Essential hypertension Blood pressure is well controlled, continue f/u with Cardiology. She is on amlodipine , Benicar /hydrochlorothiazide. Mixed hyperlipidemia Cholesterol levels are stable. Continue current medications and focusing on low fat diet.  Abnormal glucose HgbA1c is stable. Will check HgbA1c  Acquired hypothyroidism Thyroid levels are normal. Continue current medications. Will  check thyroid levels Influenza vaccination declined  Herpes zoster vaccination declined  BMI 31.0-31.9,adult She is encouraged to strive for BMI less than 30 to decrease cardiac risk. Advised to aim for at least 150 minutes of exercise per week.  Encounter for screening  Deformity of nail bed Possibly due to trauma from steel-toed shoes, with stack nail formation. Pain reported with tenderness when inserting earplugs. - Referred to podiatrist for evaluation and management. Right ear pain Fluid behind tympanic membrane, possibly due to weather changes. Pain likely from fluid pressure. - Provided Zyrtec samples for nighttime use to reduce fluid. - Advised increased water intake. - Instructed to avoid Zyrtec when driving due to drowsiness.  Orders Placed This Encounter  Procedures   Hemoglobin A1c   Lipid panel   BMP8+eGFR   TSH + free T4   Hepatitis B Surface Antibody   Ambulatory referral to Podiatry     Return for [place] for shingles shot - keep same next OV.  Patient was given opportunity to ask questions. Patient verbalized understanding of the plan and was able to repeat key elements of the plan. All questions were answered to their satisfaction.   Stephanie [name], FNP, have reviewed all documentation for this visit. The documentation on 01/11/24 for the exam, diagnosis, procedures, and orders are all accurate  and complete.    IF YOU HAVE BEEN REFERRED TO A SPECIALIST, IT MAY TAKE 1-2 WEEKS TO SCHEDULE/PROCESS THE REFERRAL. IF YOU HAVE NOT HEARD FROM US /SPECIALIST IN TWO WEEKS, PLEASE GIVE US  A CALL AT [phone].

## 2024-01-12 LAB — HEPATITIS B SURFACE ANTIBODY,QUALITATIVE: Hep B Surface Ab, Qual: NONREACTIVE

## 2024-01-12 LAB — TSH+FREE T4
Free T4: 1.63 ng/dL (ref 0.82–1.77)
TSH: 1.61 u[IU]/mL (ref 0.450–4.500)

## 2024-01-12 LAB — BMP8+EGFR
BUN/Creatinine Ratio: 25 — ABNORMAL HIGH (ref 9–23)
BUN: 17 mg/dL (ref 6–24)
CO2: 21 mmol/L (ref 20–29)
Calcium: 9.7 mg/dL (ref 8.7–10.2)
Chloride: 99 mmol/L (ref 96–106)
Creatinine, Ser: 0.67 mg/dL (ref 0.57–1.00)
Glucose: 95 mg/dL (ref 70–99)
Potassium: 3.6 mmol/L (ref 3.5–5.2)
Sodium: 138 mmol/L (ref 134–144)
eGFR: 101 mL/min/1.73 (ref >59)

## 2024-01-12 LAB — LIPID PANEL
Chol/HDL Ratio: 2.7 ratio (ref 0.0–4.4)
Cholesterol, Total: 204 mg/dL — ABNORMAL HIGH (ref 100–199)
HDL: 76 mg/dL (ref >39)
LDL Chol Calc (NIH): 110 mg/dL — ABNORMAL HIGH (ref 0–99)
Triglycerides: 104 mg/dL (ref 0–149)
VLDL Cholesterol Cal: 18 mg/dL (ref 5–40)

## 2024-01-12 LAB — HEMOGLOBIN A1C
Est. average glucose Bld gHb Est-mCnc: 126 mg/dL
Hgb A1c MFr Bld: 6 % — ABNORMAL HIGH (ref 4.8–5.6)

## 2024-01-19 ENCOUNTER — Ambulatory Visit: Admitting: Family Medicine

## 2024-01-19 ENCOUNTER — Encounter: Payer: Self-pay | Admitting: Family Medicine

## 2024-01-19 ENCOUNTER — Ambulatory Visit: Payer: Self-pay

## 2024-01-19 ENCOUNTER — Ambulatory Visit: Payer: Self-pay | Admitting: Nurse Practitioner

## 2024-01-19 VITALS — BP 118/60 | HR 72 | Temp 97.9°F | Ht 62.0 in | Wt 169.0 lb

## 2024-01-19 DIAGNOSIS — L608 Other nail disorders: Secondary | ICD-10-CM | POA: Insufficient documentation

## 2024-01-19 DIAGNOSIS — H9201 Otalgia, right ear: Secondary | ICD-10-CM | POA: Insufficient documentation

## 2024-01-19 DIAGNOSIS — L509 Urticaria, unspecified: Secondary | ICD-10-CM | POA: Diagnosis not present

## 2024-01-19 MED ORDER — METHYLPREDNISOLONE 4 MG PO TBPK
ORAL_TABLET | ORAL | 0 refills | Status: AC
Start: 2024-01-19 — End: ?

## 2024-01-19 MED ORDER — LORATADINE 10 MG PO TABS: 10.0000 mg | ORAL_TABLET | Freq: Every day | ORAL | 11 refills | Status: AC

## 2024-01-19 NOTE — Assessment & Plan Note (Addendum)
 Cholesterol levels are stable. Continue current medications and focusing on low fat diet.

## 2024-01-19 NOTE — Assessment & Plan Note (Addendum)
 Blood pressure is well controlled, continue f/u with Cardiology. She is on amlodipine , Benicar /hydrochlorothiazide.

## 2024-01-19 NOTE — Progress Notes (Signed)
   Name: Stephanie Garrison   Date of Visit: 01/19/24   Date of last visit with me: Visit date not found   CHIEF COMPLAINT:  Chief Complaint  Patient presents with   Rash    Rash right arm started this past Sunday-itchy and painful. Has tried OTC cortisone cream and Gold Bond.        HPI:  Discussed the use of AI scribe software for clinical note transcription with the patient, who gave verbal consent to proceed.  History of Present Illness   Stephanie Garrison is a 58 year old female who presents with itchy welts and hives.  She reports having itchy welts, which she describes as hives. These welts have appeared before, with some drying up about a month ago and others last week. This is the largest she has ever gotten.  They are itchy. No recent changes in personal care products such as deodorant or soap. Cold weather does not typically bother her. Initially thought the welts might be insect bites, but her husband has not experienced similar symptoms, leading her to doubt this possibility.  Currently using lotion and body oil for moisturizing, but no specific medications are being used for the welts at this time.         OBJECTIVE:       12 /31/2024   11:48 AM  Depression screen PHQ 2/9  Decreased Interest 0  Down, Depressed, Hopeless 0  PHQ - 2 Score 0  Altered sleeping 0  Tired, decreased energy 0  Change in appetite 0  Feeling bad or failure about yourself  0  Trouble concentrating 0  Moving slowly or fidgety/restless 0  Suicidal thoughts 0  PHQ-9 Score 0   Difficult doing work/chores Not difficult at all     Data saved with a previous flowsheet row definition     BP Readings from Last 3 Encounters:  01/19/24 118/60  01/11/24 126/78  01/10/24 (!) 130/90    BP 118/60   Pulse 72   Temp 97.9 F (36.6 C) (Tympanic)   Ht 5' 2 (1.575 m)   Wt 169 lb (76.7 kg)   LMP  (LMP Unknown)   BMI 30.91 kg/m    Physical Exam   SKIN: Hives on right shoulder. Scarring  from previous hives.      Physical Exam Skin:    Comments: Inspection reveals hive-like rash over the right tricep area.  Redness and swelling consistent with urticaria.     ASSESSMENT/PLAN:   Assessment & Plan Hives  Urticaria    Assessment and Plan    Urticaria (hives) Urticaria likely due to weather changes, presenting as itchy welts. Inflammatory and allergic etiology considered. - Prescribed Claritin  daily for a few days. - If no improvement by Friday, initiate short course of oral steroids. - Advised use of fragrance-free moisturizer. - Instructed to contact clinic if symptoms persist despite steroids.         Nellie Pester A. Vita MD [facility]

## 2024-01-19 NOTE — Assessment & Plan Note (Signed)
 She is encouraged to strive for BMI less than 30 to decrease cardiac risk. Advised to aim for at least 150 minutes of exercise per week. Weight has been stable.

## 2024-01-19 NOTE — Assessment & Plan Note (Signed)
 Fluid behind tympanic membrane, possibly due to weather changes. Pain likely from fluid pressure. - Provided Zyrtec samples for nighttime use to reduce fluid. - Advised increased water intake. - Instructed to avoid Zyrtec when driving due to drowsiness.

## 2024-01-19 NOTE — Assessment & Plan Note (Addendum)
 HgbA1c is stable. Will check HgbA1c

## 2024-01-19 NOTE — Telephone Encounter (Signed)
 FYI Only or Action Required?: FYI only for provider: appointment scheduled on today at alternative office.  Patient was last seen in primary care on 01/11/2024 by Georgina Speaks, FNP.  Called Nurse Triage reporting Insect Bite.  Symptoms began several days ago.  Symptoms are: gradually worsening.  Triage Disposition: See PCP When Office is Open (Within 3 Days)  Patient/caregiver understands and will follow disposition?: Yes    Copied from CRM #[id number]. Topic: Clinical - Red Word Triage >> Jan 19, 2024  2:20 PM Stephanie Garrison wrote: Red Word that prompted transfer to Nurse Triage: 4 bruises on right, trying to get worse. Husband believes she's allergic & she believes she's getting bit.     Reason for Disposition  [1] SEVERE local itching (e.g., interferes with work, school, sleep) AND [2] not improved after 24 hours of hydrocortisone cream  Answer Assessment - Initial Assessment Questions 1. TYPE of INSECT: What type of insect was it?      Unsure  2. ONSET: When did you get bitten?      4 days ago  3. LOCATION: Where is the insect bite located?      Under right arm and on side under right armpit, 5 spots 4. REDNESS: Is the area red or pink? If Yes, ask: What size is the area of redness? (inches or cm). When did the redness start?     Yes 5. PAIN: Is there any pain? If Yes, ask: How bad is the pain? (Scale 0-10; or none, mild, moderate, severe)     No 6. ITCHING: Does it itch? If Yes, ask: How bad is the itch?      Yes 7. SWELLING: How big is the swelling? (e.g., inches, cm, or compare to coins)     Mild 8. OTHER SYMPTOMS: Do you have any other symptoms?  (e.g., difficulty breathing, fever, hives)     No  Protocols used: Insect Bite-A-AH

## 2024-01-19 NOTE — Assessment & Plan Note (Signed)
 Possibly due to trauma from steel-toed shoes, with stack nail formation. Pain reported with tenderness when inserting earplugs. - Referred to podiatrist for evaluation and management.

## 2024-01-19 NOTE — Assessment & Plan Note (Addendum)
Thyroid levels are normal. Continue current medications. Will check thyroid levels

## 2024-01-19 NOTE — Assessment & Plan Note (Signed)
 She is encouraged to strive for BMI less than 30 to decrease cardiac risk. Advised to aim for at least 150 minutes of exercise per week.

## 2024-01-28 ENCOUNTER — Ambulatory Visit: Admitting: Podiatry

## 2024-01-28 ENCOUNTER — Encounter: Payer: Self-pay | Admitting: Podiatry

## 2024-01-28 DIAGNOSIS — S90221A Contusion of right lesser toe(s) with damage to nail, initial encounter: Secondary | ICD-10-CM

## 2024-01-28 DIAGNOSIS — L600 Ingrowing nail: Secondary | ICD-10-CM

## 2024-01-28 NOTE — Progress Notes (Signed)
 Subjective:   Patient ID: Stephanie Garrison, female   DOB: 58 y.o.   MRN: [id number]   HPI Patient presents stating he is concerned about discoloration of his fifth nailbed right over left with thickness and abnormal growth pattern.  States it does not been getting tender currently does not smoke and he does like to be active   Review of Systems  All other systems reviewed and are negative.       Objective:  Physical Exam Vitals and nursing note reviewed.  Constitutional:      Appearance: She is well-developed.  Pulmonary:     Effort: Pulmonary effort is normal.  Musculoskeletal:        General: Normal range of motion.  Skin:    General: Skin is warm.  Neurological:     Mental Status: She is alert.     Neurovascular status found to be intact muscle strength was found to be adequate range of motion adequate with thick dystrophic nailbeds fifth right over left with rotated fifth toes creating pressure.  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  All questions answered today

## 2024-02-03 ENCOUNTER — Ambulatory Visit: Payer: Self-pay | Admitting: Cardiovascular Disease

## 2024-07-18 ENCOUNTER — Encounter: Payer: Self-pay | Admitting: Nurse Practitioner

## 2024-07-19 ENCOUNTER — Encounter: Payer: Self-pay | Admitting: Nurse Practitioner
